# Patient Record
Sex: Female | Born: 1970
Health system: Southern US, Community
[De-identification: ages and names within clinical notes are randomized; demographics above are authoritative.]

## PROBLEM LIST (undated history)

## (undated) DIAGNOSIS — M5412 Radiculopathy, cervical region: Secondary | ICD-10-CM

## (undated) DIAGNOSIS — M7062 Trochanteric bursitis, left hip: Secondary | ICD-10-CM

## (undated) DIAGNOSIS — F32A Depression, unspecified: Secondary | ICD-10-CM

## (undated) DIAGNOSIS — F419 Anxiety disorder, unspecified: Secondary | ICD-10-CM

## (undated) DIAGNOSIS — I2699 Other pulmonary embolism without acute cor pulmonale: Secondary | ICD-10-CM

## (undated) DIAGNOSIS — G8929 Other chronic pain: Secondary | ICD-10-CM

## (undated) DIAGNOSIS — F329 Major depressive disorder, single episode, unspecified: Secondary | ICD-10-CM

## (undated) DIAGNOSIS — I951 Orthostatic hypotension: Secondary | ICD-10-CM

## (undated) DIAGNOSIS — E785 Hyperlipidemia, unspecified: Secondary | ICD-10-CM

## (undated) DIAGNOSIS — M7061 Trochanteric bursitis, right hip: Secondary | ICD-10-CM

## (undated) DIAGNOSIS — F4000 Agoraphobia, unspecified: Secondary | ICD-10-CM

## (undated) HISTORY — DX: Hyperlipidemia, unspecified: E78.5

## (undated) HISTORY — PX: BACK SURGERY: SHX140

## (undated) HISTORY — PX: OSTEOTOMY: SHX137

## (undated) HISTORY — DX: Other pulmonary embolism without acute cor pulmonale: I26.99

## (undated) HISTORY — DX: Agoraphobia, unspecified: F40.00

## (undated) HISTORY — PX: OTHER SURGICAL HISTORY: SHX169

---

## 2001-05-13 ENCOUNTER — Encounter: Payer: Self-pay | Admitting: Family Medicine

## 2001-05-13 ENCOUNTER — Ambulatory Visit (HOSPITAL_COMMUNITY): Admission: RE | Admit: 2001-05-13 | Discharge: 2001-05-13 | Payer: Self-pay | Admitting: Family Medicine

## 2002-06-02 ENCOUNTER — Encounter: Payer: Self-pay | Admitting: Family Medicine

## 2002-06-02 ENCOUNTER — Ambulatory Visit (HOSPITAL_COMMUNITY): Admission: RE | Admit: 2002-06-02 | Discharge: 2002-06-02 | Payer: Self-pay | Admitting: Family Medicine

## 2004-08-15 ENCOUNTER — Ambulatory Visit: Payer: Self-pay | Admitting: *Deleted

## 2004-08-15 ENCOUNTER — Ambulatory Visit (HOSPITAL_COMMUNITY): Admission: RE | Admit: 2004-08-15 | Discharge: 2004-08-15 | Payer: Self-pay | Admitting: Family Medicine

## 2005-07-03 DIAGNOSIS — Z8659 Personal history of other mental and behavioral disorders: Secondary | ICD-10-CM

## 2005-07-03 HISTORY — DX: Personal history of other mental and behavioral disorders: Z86.59

## 2006-11-11 ENCOUNTER — Emergency Department (HOSPITAL_COMMUNITY): Admission: EM | Admit: 2006-11-11 | Discharge: 2006-11-11 | Payer: Self-pay | Admitting: Emergency Medicine

## 2008-08-25 ENCOUNTER — Emergency Department (HOSPITAL_COMMUNITY): Admission: EM | Admit: 2008-08-25 | Discharge: 2008-08-25 | Payer: Self-pay | Admitting: Emergency Medicine

## 2010-03-19 ENCOUNTER — Emergency Department (HOSPITAL_COMMUNITY): Admission: EM | Admit: 2010-03-19 | Discharge: 2010-03-19 | Payer: Self-pay | Admitting: Emergency Medicine

## 2010-07-03 DIAGNOSIS — I2699 Other pulmonary embolism without acute cor pulmonale: Secondary | ICD-10-CM

## 2010-07-03 HISTORY — DX: Other pulmonary embolism without acute cor pulmonale: I26.99

## 2010-11-18 NOTE — Procedures (Signed)
NAMEREBECKA, OELKERS             ACCOUNT NO.:  [id number]   MEDICAL RECORD NO.:  [id number]          PATIENT TYPE:  OUT   LOCATION:  RAD                           FACILITY:  [facility]   PHYSICIAN:   Bing, M.D.  DATE OF BIRTH:  1971-06-26   DATE OF PROCEDURE:  08/15/2004  DATE OF DISCHARGE:                                  ECHOCARDIOGRAM   REFERRING PHYSICIAN:  Scott A. [name], M.D.   CLINICAL DATA:  A 40 year old woman with palpitations.   M-MODE:  Aorta 2.6, left atrium 3.3, septum 1.0, posterior wall 1.0, LV  diastole 4.4, LV systole 3.1.   RESULTS:  1.  Technically adequate echocardiographic study.  2.  Normal left atrium, right atrium and right ventricle.  3.  Normal aortic, mitral, tricuspid and pulmonic valves; normal proximal      pulmonary artery.  4.  Normal internal dimension, wall thickness, regional and global function      of the left ventricle.  5.  Normal IVC.  6.  Normal Doppler study.      RR/MEDQ  D:  08/15/2004  T:  08/15/2004  Job:  [id number]

## 2010-12-26 ENCOUNTER — Other Ambulatory Visit (HOSPITAL_COMMUNITY)
Admission: RE | Admit: 2010-12-26 | Discharge: 2010-12-26 | Disposition: A | Discharge status: Home / Self Care | Payer: BC Managed Care – PPO | Source: Ambulatory Visit | Attending: Obstetrics & Gynecology | Admitting: Obstetrics & Gynecology

## 2010-12-26 DIAGNOSIS — Z01419 Encounter for gynecological examination (general) (routine) without abnormal findings: Secondary | ICD-10-CM | POA: Insufficient documentation

## 2011-01-04 ENCOUNTER — Emergency Department (HOSPITAL_COMMUNITY): Payer: BC Managed Care – PPO

## 2011-01-04 ENCOUNTER — Inpatient Hospital Stay (HOSPITAL_COMMUNITY)
Admission: EM | Admit: 2011-01-04 | Discharge: 2011-01-06 | DRG: 078 | Disposition: A | Discharge status: Home / Self Care | Payer: BC Managed Care – PPO | Attending: Emergency Medicine | Admitting: Emergency Medicine

## 2011-01-04 ENCOUNTER — Encounter (HOSPITAL_COMMUNITY): Payer: Self-pay | Admitting: Radiology

## 2011-01-04 DIAGNOSIS — F17200 Nicotine dependence, unspecified, uncomplicated: Secondary | ICD-10-CM | POA: Diagnosis present

## 2011-01-04 DIAGNOSIS — N949 Unspecified condition associated with female genital organs and menstrual cycle: Secondary | ICD-10-CM | POA: Diagnosis present

## 2011-01-04 DIAGNOSIS — F341 Dysthymic disorder: Secondary | ICD-10-CM | POA: Diagnosis present

## 2011-01-04 DIAGNOSIS — N938 Other specified abnormal uterine and vaginal bleeding: Secondary | ICD-10-CM | POA: Diagnosis present

## 2011-01-04 DIAGNOSIS — I2699 Other pulmonary embolism without acute cor pulmonale: Principal | ICD-10-CM | POA: Diagnosis present

## 2011-01-04 DIAGNOSIS — N10 Acute tubulo-interstitial nephritis: Secondary | ICD-10-CM | POA: Diagnosis present

## 2011-01-04 LAB — CBC
HCT: 39.2 % (ref 36.0–46.0)
Hemoglobin: 13.3 g/dL (ref 12.0–15.0)
MCH: 30.4 pg (ref 26.0–34.0)
MCHC: 33.9 g/dL (ref 30.0–36.0)
MCV: 89.5 fL (ref 78.0–100.0)
Platelets: 239 10*3/uL (ref 150–400)
RBC: 4.38 MIL/uL (ref 3.87–5.11)
RDW: 12.7 % (ref 11.5–15.5)
WBC: 8.5 10*3/uL (ref 4.0–10.5)

## 2011-01-04 LAB — URINALYSIS, MICROSCOPIC ONLY
Glucose, UA: NEGATIVE mg/dL
Hgb urine dipstick: NEGATIVE
Leukocytes, UA: NEGATIVE
Specific Gravity, Urine: 1.01 (ref 1.005–1.030)
pH: 6 (ref 5.0–8.0)

## 2011-01-04 LAB — DIFFERENTIAL
Basophils Absolute: 0 10*3/uL (ref 0.0–0.1)
Basophils Relative: 0 % (ref 0–1)
Eosinophils Absolute: 0.3 10*3/uL (ref 0.0–0.7)
Eosinophils Relative: 3 % (ref 0–5)
Lymphocytes Relative: 27 % (ref 12–46)
Lymphs Abs: 2.3 10*3/uL (ref 0.7–4.0)
Monocytes Absolute: 0.8 10*3/uL (ref 0.1–1.0)
Monocytes Relative: 9 % (ref 3–12)
Neutro Abs: 5.1 10*3/uL (ref 1.7–7.7)
Neutrophils Relative %: 61 % (ref 43–77)

## 2011-01-04 LAB — BASIC METABOLIC PANEL
CO2: 24 mEq/L (ref 19–32)
Calcium: 9.2 mg/dL (ref 8.4–10.5)
Creatinine, Ser: 0.92 mg/dL (ref 0.50–1.10)
GFR calc non Af Amer: >60 mL/min (ref >60)
Glucose, Bld: 92 mg/dL (ref 70–99)
Sodium: 136 mEq/L (ref 135–145)

## 2011-01-04 MED ORDER — IOHEXOL 350 MG/ML SOLN
80.0000 mL | Freq: Once | INTRAVENOUS | Status: AC | PRN
  Administered 2011-01-04: 80 mL via INTRAVENOUS

## 2011-01-05 LAB — COMPREHENSIVE METABOLIC PANEL
ALT: 12 U/L (ref 0–35)
AST: 9 U/L (ref 0–37)
Albumin: 3.1 g/dL — ABNORMAL LOW (ref 3.5–5.2)
Calcium: 8.9 mg/dL (ref 8.4–10.5)
Chloride: 108 mEq/L (ref 96–112)
Creatinine, Ser: 0.79 mg/dL (ref 0.50–1.10)
Sodium: 139 mEq/L (ref 135–145)
Total Bilirubin: 0.2 mg/dL — ABNORMAL LOW (ref 0.3–1.2)

## 2011-01-05 LAB — PROTIME-INR
INR: 1.06 (ref 0.00–1.49)
Prothrombin Time: 14 seconds (ref 11.6–15.2)

## 2011-01-05 LAB — T4, FREE: Free T4: 1.9 ng/dL — ABNORMAL HIGH (ref 0.80–1.80)

## 2011-01-05 LAB — DIFFERENTIAL
Basophils Absolute: 0 10*3/uL (ref 0.0–0.1)
Basophils Relative: 0 % (ref 0–1)
Lymphocytes Relative: 24 % (ref 12–46)
Monocytes Absolute: 0.6 10*3/uL (ref 0.1–1.0)
Neutro Abs: 4.5 10*3/uL (ref 1.7–7.7)
Neutrophils Relative %: 63 % (ref 43–77)

## 2011-01-05 LAB — CBC
Hemoglobin: 12.8 g/dL (ref 12.0–15.0)
MCHC: 34 g/dL (ref 30.0–36.0)
WBC: 7.2 10*3/uL (ref 4.0–10.5)

## 2011-01-05 LAB — TSH: TSH: 2.302 u[IU]/mL (ref 0.350–4.500)

## 2011-01-06 LAB — CBC
HCT: 39.7 % (ref 36.0–46.0)
Hemoglobin: 13.5 g/dL (ref 12.0–15.0)
WBC: 8.2 10*3/uL (ref 4.0–10.5)

## 2011-01-06 LAB — URINE CULTURE
Culture  Setup Time: 201207052249
Special Requests: NEGATIVE

## 2011-01-06 LAB — PROTEIN S ACTIVITY: Protein S Activity: 150 % — ABNORMAL HIGH (ref 69–129)

## 2011-01-06 LAB — DIFFERENTIAL
Basophils Absolute: 0 10*3/uL (ref 0.0–0.1)
Lymphocytes Relative: 30 % (ref 12–46)
Lymphs Abs: 2.4 10*3/uL (ref 0.7–4.0)
Neutro Abs: 4.6 10*3/uL (ref 1.7–7.7)

## 2011-01-06 LAB — FACTOR 5 LEIDEN

## 2011-01-06 LAB — PROTEIN C ACTIVITY: Protein C Activity: 153 % — ABNORMAL HIGH (ref 75–133)

## 2011-01-06 LAB — PROTIME-INR: INR: 1.18 (ref 0.00–1.49)

## 2011-01-06 LAB — ANTITHROMBIN III: AntiThromb III Func: 111 % (ref 76–126)

## 2011-01-06 LAB — LUPUS ANTICOAGULANT PANEL
Lupus Anticoagulant: NOT DETECTED
dRVVT Incubated 1:1 Mix: 40.3 secs (ref 36.2–44.3)

## 2011-01-06 LAB — PROTEIN C, TOTAL: Protein C, Total: 124 % (ref 72–160)

## 2011-01-07 NOTE — H&P (Signed)
NAME:  Christina Blake, Christina Blake NO.:  [id number]  MEDICAL RECORD NO.:  [id number]  LOCATION:  IC12                          FACILITY:  [facility]  PHYSICIAN:  Elliot Cousin, M.D.    DATE OF BIRTH:  Oct 22, 1970  DATE OF ADMISSION:  01/04/2011 DATE OF DISCHARGE:  LH                             HISTORY & PHYSICAL   PRIMARY CARE PHYSICIAN:  W. Simone Curia, MD  PRIMARY GYNECOLOGIST:  Lazaro Arms, MD  CHIEF COMPLAINT:  Right mid upper back pain, shortness of breath, and pleurisy.  HISTORY OF PRESENT ILLNESS:  The patient is a 40 year old woman with a past medical history significant for anxiety, depression, and dysfunctional uterine bleeding, who presents to the hospital today with a chief complaint of right mid back pain.  Approximately 2 weeks ago, she was started on Megace by her gynecologist for treatment of dysfunctional uterine bleeding.  Last week, she traveled to [place] via motor vehicle for vacation.  She developed an acute onset of right mid back pain.  She went to a local urgent care in [place].  They obtained a urinalysis which revealed bacteria and white blood cells.  She was given the tentative diagnosis of acute pyelonephritis.  She was started on Levaquin.  Two days later, she returned to the urgent care for followup. The physician told her that she was dehydrated.  She was given 2 L of IV fluids.  The physician subsequently referred her to the local hospital for an evaluation of kidney stones.  A CT scan of her abdomen and pelvis was ordered.  She was told by one physician that she may have had a kidney stone but by another physician she was told that she did not have a kidney stone.  Of note, the day after she arrived in [place], both of her legs began to swell, but the swelling eventually subsided.  When she returned home a few days ago, she followed up with her primary care physician, Dr. [name].  He obtained the records from [place].  He was concerned  and advised the patient to have a D-dimer lab test drawn at [facility].  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  She went to the lab there.  Her D-dimer was found to be greater than 4.  She called the on- call physician for Dr. [name], Dr. Milford Cage.  Dr. Milford Cage advised the patient to come to the hospital for further evaluation.  During the initial evaluation in the emergency department, the patient was noted to be hemodynamically stable and afebrile.  She was oxygenating between 95-100%.  Her EKG revealed sinus bradycardia with a heart rate of 59 beats per minute.  A CT angiogram of her chest was ordered by the emergency department physician and it revealed extensive bilateral pulmonary embolism, small bilateral pleural effusions, and a probable developing infarct in the superior segment of the right lower lobe.  The patient was transferred to the Step-Down Unit.  She is currently comfortable.  She is in no acute distress.  She is being admitted for further evaluation and management.  PAST MEDICAL HISTORY: 1. Chronic anxiety. 2. Major  depression. 3. Dysfunctional uterine bleeding. 4. Recent diagnosis of urinary tract infection, possibly acute     pyelonephritis, treated with Levaquin.  MEDICATIONS: 1. Albuterol inhaler 2 puffs every 4 hours as needed for shortness of     breath. 2. Levaquin 750 mg daily. 3. Paroxetine CR 37.5 mg daily. 4. Alprazolam 0.1 mg 1 tablet daily and 2 tablets at night. 5. Abilify 5 mg at bedtime. 6. Megace 40 mg tablets to be tapered as directed. 7. Trazodone 100 mg at bedtime.  ALLERGIES:  No known drug allergies.  SOCIAL HISTORY:  The patient is married.  She has two children.  [place]  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  She stopped smoking 1 week ago when she became short of breath.  Up until that point, she smoked 1-pack of cigarettes per  day for 20 years.  She denies alcohol and illicit drug use.  FAMILY HISTORY:  Her mother is 48 years of age and has coronary artery disease, hypertension, and history of bladder cancer.  Her father is 58 years of age and has coronary artery disease and hypertension.  The patient has no family history of blood clots.  REVIEW OF SYSTEMS:  As above in the history present illness.  Otherwise review of systems is negative.  PHYSICAL EXAMINATION:  VITAL SIGNS:  Temperature 97.7, pulse 86, blood pressure 127/64, respiratory rate 19, and oxygen saturation 100% on 2 L of oxygen. GENERAL:  The patient is a pleasant 40 year old Caucasian woman who is currently lying in bed in no acute distress. HEENT:  Head is normocephalic and nontraumatic.  Pupils equal, round, and reactive to light.  Extraocular muscles are intact.  Conjunctivae are clear.  Sclerae are white.  Tympanic membranes not examined.  Nasal mucosa is dry.  No sinus tenderness.  Oropharynx reveals good dentition. Mucous membranes are moist.  No posterior exudates or erythema. NECK:  Supple.  No adenopathy, no thyromegaly, no bruit, and no JVD. LUNGS:  Clear to auscultation bilaterally with no crackles or wheezes. Breathing is nonlabored at rest. HEART:  S1-S2 with no murmurs, rubs, or gallops. ABDOMEN:  Positive bowel sounds, soft, nontender, and nondistended.  No hepatosplenomegaly and no masses palpated. GU/RECTAL:  Deferred. EXTREMITIES:  No calf tenderness.  Trace pedal edema bilaterally.  No particular erythema.  Range of motion of her extremities is within normal limits.  No acute hot red joints. NEUROLOGIC:  The patient is alert and oriented x3.  Cranial nerves II- XII are intact.  Strength is 5/5 throughout.  Sensation is intact. PSYCHOLOGIC:  The patient is alert.  Her speech is clear.  She is cooperative.  She does appear a little anxious.  ADMISSION LABORATORIES:  The results of the EKG and CT angiogram of the chest  were dictated above.  Chest x-ray reveals normal exam.  PT 14.0 and INR 1.06.  Sodium 136, potassium 3.9, chloride 103, CO2 24, glucose 92, BUN 19, creatinine 0.92, and calcium 9.2.  WBC 8.5, hemoglobin 13.3, and platelet count 239.  ASSESSMENT: 1. Bilateral pulmonary embolism in a patient who was recently started     on Megace.  The combination of Megace, tobacco abuse, and travel,     likely made the patient hypercoagulable.  A genetic component will     need to be assessed. 2. Small bilateral pleural effusions and a probable right lower lobe     developing infarction. 3. Chronic depression with anxiety. 4. Recent diagnosis of  acute pyelonephritis. 5. Recent treatment of dysfunctional uterine bleeding with Megace.  PLAN: 1. We will start anticoagulation with full-dose Lovenox and Coumadin. 2. We will apply oxygen titrated to keep her oxygen saturations more     than 92%.  Oxygen will also be administered for comfort. 3. Her pain will be treated with as-needed morphine or as-needed     oxycodone. 4. She will be continued on all of her chronic medications with     exception of Megace which will be discontinued indefinitely. 5. We will start as-needed bronchodilator therapy with albuterol     nebulizations although the patient has no active bronchospasms now. 6. We will gently hydrate her. 7. For further evaluation, we will order a hypercoagulable panel, TSH,     free T4, and liver transaminases. 8. The patient was congratulated on stopping smoking.     Elliot Cousin, M.D.     DF/MEDQ  D:  01/04/2011  T:  01/04/2011  Job:  [id number]  cc:   Donna Bernard, M.D. Fax: [phone]  Lazaro Arms, M.D. Fax: [phone]  Electronically Signed by Elliot Cousin M.D. on 01/07/2011 07:58:43 PM

## 2011-01-09 LAB — PROTHROMBIN GENE MUTATION

## 2011-01-13 LAB — BETA-2-GLYCOPROTEIN I ABS, IGG/M/A
Beta-2-Glycoprotein I IgA: 2 A Units (ref <20)
Beta-2-Glycoprotein I IgM: 32 M Units — ABNORMAL HIGH (ref <20)

## 2011-01-13 LAB — CARDIOLIPIN ANTIBODIES, IGG, IGM, IGA
Anticardiolipin IgA: 3 APL U/mL — ABNORMAL LOW (ref <22)
Anticardiolipin IgG: 2 GPL U/mL — ABNORMAL LOW (ref <23)

## 2011-01-20 NOTE — Discharge Summary (Signed)
NAMEOTTILIA, PIPPENGER             ACCOUNT NO.:  [id number]  MEDICAL RECORD NO.:  [id number]  LOCATION:  A304                          FACILITY:  [facility]  PHYSICIAN:  Nelline Lio L. Lendell Caprice, MDDATE OF BIRTH:  Feb 03, 1971  DATE OF ADMISSION:  01/04/2011 DATE OF DISCHARGE:  07/06/2012LH                              DISCHARGE SUMMARY   DISCHARGE DIAGNOSES: 1. Acute pulmonary embolus with infarct. 2. Anxiety. 3. Dysfunctional uterine bleeding. 4. History of depression. 5. Recently diagnosed pyelonephritis. 6. Tobacco abuse, wishes to quit.  DISCHARGE MEDICATIONS: 1. Lovenox 120 mg subcutaneously daily for 3 days, continue daily if     needed for INR less than 2.0. 2. Oxycodone 5 mg p.o. q.4 h. p.r.n. pain. 3. Warfarin 5 mg two tablets Saturday, one tablet Sunday, then as     directed by primary care physician to keep INR between 2 and 3. 4. Acetaminophen 650 mg p.o. q.4 h. p.r.n. pain. 5. Continue Abilify 5 mg nightly. 6. Alprazolam 0.5 mg one tablet daily, two tablets nightly. 7. Levofloxacin 750 mg as previous. 8. Paroxetine CR 37.5 mg a day. 9. Trazodone 100 mg nightly. 10.Albuterol inhaler 2 puffs q.4 h. p.r.n. wheezing or shortness of     breath.  CONDITION:  Stable.  ACTIVITY:  Increase as tolerated.  FOLLOWUP:  With Dr. [name] on Monday, 3 days from today for INR check and Coumadin management.  Please follow up also the rest of her hypercoagulable panel.  DIET:  Should be warfarin compatible.  CONSULTATIONS:  None.  PROCEDURES:  None.  LABS:  CBC normal.  INR on admission 1.06.  INR at discharge 1.18. Antithrombin III, protein C, protein S were all unremarkable, but drawn after anticoagulation was started.  Complete metabolic panel significant for an albumin of 3.1.  TSH normal at 2.3.  Urine pregnancy negative. Homocystine 8.5.  Urinalysis negative.  Urine cultures pending.  DIAGNOSTICS:  CT angiogram of the chest showed bilateral extensive pulmonary emboli with  small bilateral pleural effusions and probable developing infarct in the superior segment of the right lower lobe. Chest x-ray is unremarkable.  EKG showed sinus bradycardia with a rate of 59, possible left atrial enlargement.  HISTORY AND HOSPITAL COURSE:  Please see H and P for details.  Ms. Kerper is a 40 year old white female, who presented with chest pain and shortness of breath.  She had recently traveled to [place] and was ill with pyelonephritis.  She also smokes and was recently started on Megace.  She had normal vital signs and an unremarkable physical exam. However, she was found to have bilateral pulmonary emboli on CT angiogram of the chest.  She was started on Lovenox, Coumadin. Hypercoagulable workup was done.  Thus far the hypercoagulable workup has been negative..  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 [name] on day #5 of her Lovenox/Coumadin bridge therapy.  She feels better, although, she does still have some mild shortness of breath and pain.  Her vital signs have been stable and she has remained in sinus rhythm on telemetry.  Total time on the day of discharge is greater than 30 minutes.     Arturo Freundlich  Burman Freestone, MD     CLS/MEDQ  D:  01/06/2011  T:  01/07/2011  Job:  [id number]  cc:   Donna Bernard, M.D. Fax: [phone]  Lazaro Arms, M.D. Fax: [phone]  Electronically Signed by Crista Curb MD on 01/20/2011 03:31:30 PM

## 2011-02-14 ENCOUNTER — Emergency Department (HOSPITAL_COMMUNITY)
Admission: EM | Admit: 2011-02-14 | Discharge: 2011-02-15 | Disposition: A | Discharge status: Home / Self Care | Payer: BC Managed Care – PPO | Attending: Emergency Medicine | Admitting: Emergency Medicine

## 2011-02-14 ENCOUNTER — Other Ambulatory Visit: Payer: Self-pay

## 2011-02-14 ENCOUNTER — Encounter (HOSPITAL_COMMUNITY): Payer: Self-pay | Admitting: *Deleted

## 2011-02-14 ENCOUNTER — Emergency Department (HOSPITAL_COMMUNITY): Payer: BC Managed Care – PPO

## 2011-02-14 DIAGNOSIS — R791 Abnormal coagulation profile: Secondary | ICD-10-CM | POA: Insufficient documentation

## 2011-02-14 DIAGNOSIS — M549 Dorsalgia, unspecified: Secondary | ICD-10-CM | POA: Insufficient documentation

## 2011-02-14 DIAGNOSIS — R079 Chest pain, unspecified: Secondary | ICD-10-CM | POA: Insufficient documentation

## 2011-02-14 DIAGNOSIS — R0789 Other chest pain: Secondary | ICD-10-CM | POA: Insufficient documentation

## 2011-02-14 LAB — CBC
HCT: 38.2 % (ref 36.0–46.0)
MCHC: 33.5 g/dL (ref 30.0–36.0)
MCV: 90.7 fL (ref 78.0–100.0)
Platelets: 268 10*3/uL (ref 150–400)
RDW: 13.4 % (ref 11.5–15.5)

## 2011-02-14 LAB — PROTIME-INR: Prothrombin Time: 15.9 seconds — ABNORMAL HIGH (ref 11.6–15.2)

## 2011-02-14 LAB — BASIC METABOLIC PANEL
BUN: 17 mg/dL (ref 6–23)
Creatinine, Ser: 0.8 mg/dL (ref 0.50–1.10)
GFR calc Af Amer: >60 mL/min (ref >60)
GFR calc non Af Amer: >60 mL/min (ref >60)

## 2011-02-14 LAB — D-DIMER, QUANTITATIVE: D-Dimer, Quant: 0.61 ug/mL-FEU — ABNORMAL HIGH (ref 0.00–0.48)

## 2011-02-14 NOTE — ED Notes (Signed)
Pt reports July 4th she was admitted with bilateral PE, tonight reports similar pain in left side of back with asso sob/chest pain

## 2011-02-14 NOTE — ED Notes (Signed)
Pt reports she had bilat PEs about one month ago.  Reports that tonight she began having some of the same symptoms.  Reports mild SOB, and occasional sharp pain on middle back on left side.  Pt reports midsternal chest pain as well.  Appears to be breathing normally with no increased effort.

## 2011-02-14 NOTE — ED Provider Notes (Signed)
History     CSN: [id number] Arrival date & time: 02/14/2011  9:53 PM  Chief Complaint  Patient presents with  . Shortness of Breath  . Back Pain   HPI Comments: Pt localizes her "sharp" pain to the L lateral chest and L  Subscapular areas.  'feels just like when i had my last PE a few months ago".  No recent leg trauma or convalescent period.  No OCP use.  No known cancer.  No hemoptysis.  PERC negative.  Her mother recently had PE also,  Patient is a 39 y.o. female presenting with shortness of breath and back pain. The history is provided by the patient. No language interpreter was used.  Shortness of Breath  The current episode started today. The problem has been gradually worsening. The problem is moderate. The symptoms are relieved by nothing. Exacerbated by: deep inspirationconnor. Associated symptoms include cough and shortness of breath. Pertinent negatives include no stridor and no wheezing.  Back Pain     Past Medical History  Diagnosis Date  . Pulmonary embolism     History reviewed. No pertinent past surgical history.  No family history on file.  History  Substance Use Topics  . Smoking status: Never Smoker   . Smokeless tobacco: Not on file  . Alcohol Use: No    OB History    Grav Para Term Preterm Abortions TAB SAB Ect Mult Living                  Review of Systems  Respiratory: Positive for cough and shortness of breath. Negative for wheezing and stridor.        Cough is NP.  Denies truly being SOB but states she is breathing shallowly to avoid painful inspiration.  Musculoskeletal: Positive for back pain.  All other systems reviewed and are negative.    Physical Exam  BP 131/78  Pulse 69  Temp(Src) 98.1 F (36.7 C) (Oral)  Resp 18  Ht 5\' 7"  (1.702 m)  Wt 192 lb (87.091 kg)  BMI 30.07 kg/m2  SpO2 100%  LMP 02/14/2011  Physical Exam  Nursing note and vitals reviewed. Constitutional: She is oriented to person, place, and time. Vital signs are  normal. She appears well-developed and well-nourished. No distress.  HENT:  Head: Normocephalic and atraumatic.  Right Ear: External ear normal.  Left Ear: External ear normal.  Nose: Nose normal.  Mouth/Throat: No oropharyngeal exudate.  Eyes: Conjunctivae and EOM are normal. Pupils are equal, round, and reactive to light. Right eye exhibits no discharge. Left eye exhibits no discharge. No scleral icterus.  Neck: Normal range of motion. Neck supple. No JVD present. No tracheal deviation present. No thyromegaly present.  Cardiovascular: Normal rate, regular rhythm, normal heart sounds, intact distal pulses and normal pulses.  Exam reveals no gallop and no friction rub.   No murmur heard. Pulmonary/Chest: Effort normal and breath sounds normal. No stridor. No respiratory distress. She has no wheezes. She has no rales.   She exhibits no tenderness.  Abdominal: Soft. Normal appearance and bowel sounds are normal. She exhibits no distension and no mass. There is no tenderness. There is no rebound and no guarding.  Musculoskeletal: Normal range of motion. She exhibits no edema and no tenderness.       Arms: Lymphadenopathy:    She has no cervical adenopathy.  Neurological: She is alert and oriented to person, place, and time. She has normal reflexes. No cranial nerve deficit. Coordination normal. GCS eye subscore  is 4. GCS verbal subscore is 5. GCS motor subscore is 6.  Reflex Scores:      Tricep reflexes are 2+ on the right side and 2+ on the left side.      Bicep reflexes are 2+ on the right side and 2+ on the left side.      Brachioradialis reflexes are 2+ on the right side and 2+ on the left side.      Patellar reflexes are 2+ on the right side and 2+ on the left side.      Achilles reflexes are 2+ on the right side and 2+ on the left side. Skin: Skin is warm and dry. No rash noted. She is not diaphoretic.  Psychiatric: She has a normal mood and affect. Her speech is normal and behavior is  normal. Judgment and thought content normal. Cognition and memory are normal.    ED Course  Procedures  MDM       Worthy Rancher, PA 02/14/11 2314

## 2011-02-15 ENCOUNTER — Emergency Department (HOSPITAL_COMMUNITY): Payer: BC Managed Care – PPO

## 2011-02-15 MED ORDER — HYDROMORPHONE HCL 2 MG/ML IJ SOLN
2.0000 mg | Freq: Once | INTRAMUSCULAR | Status: DC
  Filled 2011-02-15: qty 1

## 2011-02-15 MED ORDER — ONDANSETRON HCL 4 MG/2ML IJ SOLN
4.0000 mg | Freq: Once | INTRAMUSCULAR | Status: DC
  Filled 2011-02-15: qty 2

## 2011-02-15 MED ORDER — IOHEXOL 300 MG/ML  SOLN
100.0000 mL | Freq: Once | INTRAMUSCULAR | Status: AC | PRN
  Administered 2011-02-15: 100 mL via INTRAVENOUS

## 2011-02-15 NOTE — ED Notes (Addendum)
See downtime flow sheet.  Evaluation and management procedures were performed by the PA/NP under my supervision/collaboration.  Chest Portable 1 View  02/14/2011  *RADIOLOGY REPORT*  Clinical Data: Chest pain. Pulmonary embolus diagnosed last month.  PORTABLE CHEST - 1 VIEW  Comparison: 01/04/2011  Findings: Cardiac and mediastinal contours appear normal.  The lungs appear clear.  No pleural effusion is identified.  IMPRESSION:  No significant abnormality identified.  Original Report Authenticated By: Dellia Cloud, M.D.   CT scan reviewed by me and shows no acute pulmonary emboli. The patient's INR is subtherapeutic which was discussed with the patient. The patient will be discharged home to followup with her primary care physician Dr. [name]. The patient states her understanding of and agreement with this plan of care.  Felisa Bonier, MD 02/15/11 [phone]

## 2011-02-16 NOTE — ED Provider Notes (Addendum)
Evaluation and management procedures were performed by the PA/NP under my supervision/collaboration.    Felisa Bonier, MD 02/16/11 0238   Date: 05/12/2011  Rate: 61  Rhythm: normal sinus rhythm  QRS Axis: normal  Intervals: normal  ST/T Wave abnormalities: normal  Conduction Disutrbances:none  Narrative Interpretation:   Old EKG Reviewed: none available    Felisa Bonier, MD 05/12/11 1505

## 2011-08-28 ENCOUNTER — Emergency Department (HOSPITAL_COMMUNITY): Payer: BC Managed Care – PPO

## 2011-08-28 ENCOUNTER — Emergency Department (HOSPITAL_COMMUNITY)
Admission: EM | Admit: 2011-08-28 | Discharge: 2011-08-28 | Disposition: A | Discharge status: Home Health | Payer: BC Managed Care – PPO | Attending: Emergency Medicine | Admitting: Emergency Medicine

## 2011-08-28 ENCOUNTER — Other Ambulatory Visit: Payer: Self-pay

## 2011-08-28 ENCOUNTER — Encounter (HOSPITAL_COMMUNITY): Payer: Self-pay | Admitting: *Deleted

## 2011-08-28 DIAGNOSIS — R059 Cough, unspecified: Secondary | ICD-10-CM | POA: Insufficient documentation

## 2011-08-28 DIAGNOSIS — E7800 Pure hypercholesterolemia, unspecified: Secondary | ICD-10-CM | POA: Insufficient documentation

## 2011-08-28 DIAGNOSIS — R0602 Shortness of breath: Secondary | ICD-10-CM | POA: Insufficient documentation

## 2011-08-28 DIAGNOSIS — R079 Chest pain, unspecified: Secondary | ICD-10-CM | POA: Insufficient documentation

## 2011-08-28 DIAGNOSIS — Z86718 Personal history of other venous thrombosis and embolism: Secondary | ICD-10-CM | POA: Insufficient documentation

## 2011-08-28 DIAGNOSIS — M940 Chondrocostal junction syndrome [Tietze]: Secondary | ICD-10-CM

## 2011-08-28 DIAGNOSIS — Z79899 Other long term (current) drug therapy: Secondary | ICD-10-CM | POA: Insufficient documentation

## 2011-08-28 DIAGNOSIS — R05 Cough: Secondary | ICD-10-CM | POA: Insufficient documentation

## 2011-08-28 DIAGNOSIS — Z7901 Long term (current) use of anticoagulants: Secondary | ICD-10-CM | POA: Insufficient documentation

## 2011-08-28 LAB — CBC
HCT: 42.9 % (ref 36.0–46.0)
Hemoglobin: 14.5 g/dL (ref 12.0–15.0)
MCH: 30.4 pg (ref 26.0–34.0)
MCHC: 33.8 g/dL (ref 30.0–36.0)
MCV: 89.9 fL (ref 78.0–100.0)

## 2011-08-28 LAB — BASIC METABOLIC PANEL
BUN: 11 mg/dL (ref 6–23)
Calcium: 9.7 mg/dL (ref 8.4–10.5)
Chloride: 102 mEq/L (ref 96–112)
Creatinine, Ser: 0.78 mg/dL (ref 0.50–1.10)
GFR calc Af Amer: >90 mL/min (ref >90)

## 2011-08-28 LAB — DIFFERENTIAL
Basophils Relative: 1 % (ref 0–1)
Eosinophils Absolute: 0.1 10*3/uL (ref 0.0–0.7)
Eosinophils Relative: 1 % (ref 0–5)
Monocytes Absolute: 0.7 10*3/uL (ref 0.1–1.0)
Monocytes Relative: 8 % (ref 3–12)
Neutro Abs: 6 10*3/uL (ref 1.7–7.7)

## 2011-08-28 LAB — POCT I-STAT TROPONIN I: Troponin i, poc: 0 ng/mL (ref 0.00–0.08)

## 2011-08-28 MED ORDER — HYDROCODONE-ACETAMINOPHEN 5-325 MG PO TABS: 2.0000 | ORAL_TABLET | ORAL | Status: AC | PRN

## 2011-08-28 NOTE — ED Provider Notes (Signed)
History  This chart was scribed for Christina Bonier, MD by Christina Blake. This patient was seen in room APA17/APA17 and the patient's care was started at 4:25PM.  CSN: [id number]  Arrival date & time 08/28/11  1400   First MD Initiated Contact with Patient 08/28/11 1513      Chief Complaint  Patient presents with  . Chest Pain     The history is provided by the patient. No language interpreter was used.    [name] is a 41 y.o. female who presents to the Emergency Department complaining of 2 days of gradual onset, gradually worsening, constant, non-radiating chest pain. The pt describes the pain as being a sub-sternally located tight and pressure like feeling. She rates the pain a 5 out of 10 at its best and 7 out of 10 at its worst. She also c/o occasional SOB and dry cough. She denies any modifying factors and has not taken anything PTA to improve her symptoms.  She reports that she has a h/o PE but states that the pain she is experiencing now is different from the pain experienced with the PE. She denies palpitations, diaphoresis, nausea, emesis and diarrhea as associated symptoms. She also has a h/o hypercholesteremia. She is a current everyday smoker but denies alcohol use.  Pt's PCP is Dr. [name].  Past Medical History  Diagnosis Date  . Pulmonary embolism July 4th, 2012  . Hypercholesteremia     History reviewed. No pertinent past surgical history.  History reviewed. No pertinent family history.  History  Substance Use Topics  . Smoking status: Current Everyday Smoker -- 1.0 packs/day    Types: Cigarettes  . Smokeless tobacco: Not on file  . Alcohol Use: No     Review of Systems  Constitutional: Negative for fever, chills and diaphoresis.  HENT: Negative for congestion, sore throat and neck pain.   Eyes: Negative for pain.  Respiratory: Positive for cough and shortness of breath.   Cardiovascular: Positive for chest pain. Negative for palpitations.    Gastrointestinal: Negative for nausea, vomiting, abdominal pain and diarrhea.  Genitourinary: Negative for dysuria, urgency and hematuria.  Musculoskeletal: Negative for back pain.  Skin: Negative for rash.  Neurological: Negative for numbness and headaches.    Allergies  Review of patient's allergies indicates no known allergies.  Home Medications   Current Outpatient Rx  Name Route Sig Dispense Refill  . ALPRAZOLAM 0.5 MG PO TABS Oral Take 0.5-1 mg by mouth 2 (two) times daily. * Take one tablet po daily, then take two tablets at bedtime*    . LAMOTRIGINE 200 MG PO TABS Oral Take 200 mg by mouth at bedtime.      . CENTRUM PO Oral Take 1 tablet by mouth daily.      Marland Kitchen PAROXETINE HCL ER 37.5 MG PO TB24 Oral Take 37.5 mg by mouth every morning.      . TRAZODONE HCL 100 MG PO TABS Oral Take 100-200 mg by mouth at bedtime. Take one tablet po 1 hour before bedtime. *may repeat once if needed*    . WARFARIN SODIUM 5 MG PO TABS Oral Take 10-12.5 mg by mouth as directed. 10mg  on Sat and Sun and 12.5mg  on every other day of the week.      Triage Vitals: BP 123/80  Pulse 95  Temp(Src) 98 F (36.7 C) (Oral)  Resp 20  Ht 5\' 7"  (1.702 m)  Wt 194 lb (87.998 kg)  BMI 30.38 kg/m2  SpO2 98%  LMP 08/14/2011  Physical Exam  Nursing note and vitals reviewed. Constitutional: She is oriented to person, place, and time. She appears well-developed and well-nourished.  HENT:  Head: Normocephalic and atraumatic.  Mouth/Throat: Oropharynx is clear and moist.  Eyes: EOM are normal. Pupils are equal, round, and reactive to light.  Neck: Normal range of motion. Neck supple. No JVD present. No tracheal deviation present.       Trachea is midline  Cardiovascular: Normal rate, regular rhythm and normal heart sounds.  Exam reveals no gallop and no friction rub.   No murmur heard.      Normal S1 and S2  Pulmonary/Chest: Effort normal and breath sounds normal. No respiratory distress. She has no wheezes.  She has no rales. She exhibits tenderness (Mild sub-sternal tenderness).       Lungs are clear bilaterally  Abdominal: Soft. Bowel sounds are normal. There is no tenderness (No tenderness to palpation ). There is no rebound and no guarding.  Musculoskeletal: She exhibits no edema (No lower extremity edema) and no tenderness (No calf tenderness).  Neurological: She is alert and oriented to person, place, and time. No cranial nerve deficit.  Skin: Skin is warm and dry.  Psychiatric: She has a normal mood and affect. Her behavior is normal.    ED Course  Procedures (including critical care time)  Date: 08/28/2011  Rate: 81  Rhythm: normal sinus rhythm  QRS Axis: normal  Intervals: normal  ST/T Wave abnormalities: normal  Conduction Disutrbances:none  Narrative Interpretation: Normal EKG  Old EKG Reviewed: none available   DIAGNOSTIC STUDIES: Oxygen Saturation is 98% on room air, normal by my interpretation.    COORDINATION OF CARE: 4:26PM-Discussed chest x-ray, D-dimer and blood panel results. 4:30PM-Discussed diagnosis with pt. Will give pt pain medications. Advised pt to follow up with PCP in 4 days if symptoms persist.   Labs Reviewed  PROTIME-INR - Abnormal; Notable for the following:    Prothrombin Time 27.0 (*)    INR 2.45 (*)    All other components within normal limits  CBC  DIFFERENTIAL  BASIC METABOLIC PANEL  D-DIMER, QUANTITATIVE  POCT I-STAT TROPONIN I   Dg Chest 2 View  08/28/2011  *RADIOLOGY REPORT*  Clinical Data: Chest pain.  CHEST - 2 VIEW  Comparison: 02/14/2011.  Findings: The cardiac silhouette, mediastinal and hilar contours are within normal limits and stable. The lungs are clear.  No pleural effusions.  The bony thorax is intact.  IMPRESSION: Normal chest x-ray.  No change since prior study.  Original Report Authenticated By: P. Loralie Champagne, M.D.     No diagnosis found.    MDM  Musculoskeletal chest pain, costochondritis, GERD,  Gastrointestinal Chest Pain, Pleuritic Chest Pain, Pneumonia, Pneumothorax, Pulmonary Embolism, Esophageal Spasm, Arrhythmia considered among other potential etiologies in the patient's differential diagnosis. ACS, MI, UA not suspected based on history, physical exam, diagnostic studies, and due to lack of risk factors for CAD.      I personally performed the services described in this documentation, which was scribed in my presence. The recorded information has been reviewed and considered.    Christina Bonier, MD 08/28/11 [phone]

## 2011-08-28 NOTE — ED Notes (Signed)
Hx PE, c/o CP to mid chest since Sat. With SOB, dizziness and weakness and back pain, denies cough

## 2011-09-13 ENCOUNTER — Other Ambulatory Visit: Payer: Self-pay | Admitting: Obstetrics & Gynecology

## 2011-09-15 NOTE — Patient Instructions (Addendum)
20 Christina Blake  09/15/2011   Your procedure is scheduled on:   09/22/2011  Report to [facility] at  745  AM.  Call this number if you have problems the morning of surgery: [phone]   Remember:   Do not eat food:After Midnight.  May have clear liquids:until Midnight .  Clear liquids include soda, tea, black coffee, apple or grape juice, broth.  Take these medicines the morning of surgery with A SIP OF WATER:  none   Do not wear jewelry, make-up or nail polish.  Do not wear lotions, powders, or perfumes. You may wear deodorant.  Do not shave 48 hours prior to surgery.  Do not bring valuables to the hospital.  Contacts, dentures or bridgework may not be worn into surgery.  Leave suitcase in the car. After surgery it may be brought to your room.  For patients admitted to the hospital, checkout time is 11:00 AM the day of discharge.   Patients discharged the day of surgery will not be allowed to drive home.  Name and phone number of your driver: family  Special Instructions: CHG Shower Use Special Wash: 1/2 bottle night before surgery and 1/2 bottle morning of surgery.   Please read over the following fact sheets that you were given: Pain Booklet, MRSA Information, Surgical Site Infection Prevention, Anesthesia Post-op Instructions and Care and Recovery After Surgery Endometrial Ablation Endometrial ablation removes the lining of the uterus (endometrium). It is usually a same day, outpatient treatment. Ablation helps avoid major surgery (such as a hysterectomy). A hysterectomy is removal of the cervix and uterus. Endometrial ablation has less risk and complications, has a shorter recovery period and is less expensive. After endometrial ablation, most women will have little or no menstrual bleeding. You may not keep your fertility. Pregnancy is no longer likely after this procedure but if you are pre-menopausal, you still need to use a reliable method of birth control following the procedure  because pregnancy can occur. REASONS TO HAVE THE PROCEDURE MAY INCLUDE:  Heavy periods.   Bleeding that is causing anemia.   Anovulatory bleeding, very irregular, bleeding.   Bleeding submucous fibroids (on the lining inside the uterus) if they are smaller than 3 centimeters.  REASONS NOT TO HAVE THE PROCEDURE MAY INCLUDE:  You wish to have more children.   You have a pre-cancerous or cancerous problem. The cause of any abnormal bleeding must be diagnosed before having the procedure.   You have pain coming from the uterus.   You have a submucus fibroid larger than 3 centimeters.   You recently had a baby.   You recently had an infection in the uterus.   You have a severe retro-flexed, tipped uterus and cannot insert the instrument to do the ablation.   You had a Cesarean section or deep major surgery on the uterus.   The inner cavity of the uterus is too large for the endometrial ablation instrument.  RISKS AND COMPLICATIONS   Perforation of the uterus.   Bleeding.   Infection of the uterus, bladder or vagina.   Injury to surrounding organs.   Cutting the cervix.   An air bubble to the lung (air embolus).   Pregnancy following the procedure.   Failure of the procedure to help the problem requiring hysterectomy.   Decreased ability to diagnose cancer in the lining of the uterus.  BEFORE THE PROCEDURE  The lining of the uterus must be tested to make sure there is no pre-cancerous  or cancer cells present.   Medications may be given to make the lining of the uterus thinner.   Ultrasound may be used to evaluate the size and look for abnormalities of the uterus.   Future pregnancy is not desired.  PROCEDURE  There are different ways to destroy the lining of the uterus.   Resectoscope - radio frequency-alternating electric current is the most common one used.   Cryotherapy - freezing the lining of the uterus.   Heated Free Liquid - heated salt (saline)  solution inserted into the uterus.   Microwave - uses high energy microwaves in the uterus.   Thermal Balloon - a catheter with a balloon tip is inserted into the uterus and filled with heated fluid.  Your caregiver will talk with you about the method used in this clinic. They will also instruct you on the pros and cons of the procedure. Endometrial ablation is performed along with a procedure called operative hysteroscopy. A narrow viewing tube is inserted through the birth canal (vagina) and through the cervix into the uterus. A tiny camera attached to the viewing tube (hysteroscope) allows the uterine cavity to be shown on a TV monitor during surgery. Your uterus is filled with a harmless liquid to make the procedure easier. The lining of the uterus is then removed. The lining can also be removed with a resectoscope which allows your surgeon to cut away the lining of the uterus under direct vision. Usually, you will be able to go home within an hour after the procedure. HOME CARE INSTRUCTIONS   Do not drive for 24 hours.   No tampons, douching or intercourse for 2 weeks or until your caregiver approves.   Rest at home for 24 to 48 hours. You may then resume normal activities unless told differently by your caregiver.   Take your temperature two times a day for 4 days, and record it.   Take any medications your caregiver has ordered, as directed.   Use some form of contraception if you are pre-menopausal and do not want to get pregnant.  Bleeding after the procedure is normal. It varies from light spotting and mildly watery to bloody discharge for 4 to 6 weeks. You may also have mild cramping. Only take over-the-counter or prescription medicines for pain, discomfort, or fever as directed by your caregiver. Do not use aspirin, as this may aggravate bleeding. Frequent urination during the first 24 hours is normal. You will not know how effective your surgery is until at least 3 months after the  surgery. SEEK IMMEDIATE MEDICAL CARE IF:   Bleeding is heavier than a normal menstrual cycle.   An oral temperature above 102 F (38.9 C) develops.   You have increasing cramps or pains not relieved with medication or develop belly (abdominal) pain which does not seem to be related to the same area of earlier cramping and pain.   You are light headed, weak or have fainting episodes.   You develop pain in the shoulder strap areas.   You have chest or leg pain.   You have abnormal vaginal discharge.   You have painful urination.  Document Released: 04/28/2004 Document Revised: 06/08/2011 Document Reviewed: 07/27/2007 [facility] Patient Information 2012 [place], [place].Dilation and Curettage or Vacuum Curettage Dilation and curettage (D&C) and vacuum curettage are minor procedures. A D&C involves stretching (dilation) the cervix and scraping (curettage) the inside lining of the womb (uterus). During a D&C, tissue is gently scraped from the inside lining of the uterus.  During a vacuum curettage, the lining and tissue in the uterus are removed with the use of gentle suction. Curettage may be performed for diagnostic or therapeutic purposes. As a diagnostic procedure, curettage is performed for the purpose of examining tissues from the uterus. Tissue examination may help determine causes or treatment options for symptoms. A diagnostic curettage may be performed for the following symptoms:  Irregular bleeding in the uterus.   Bleeding with the development of clots.   Spotting between menstrual periods.   Prolonged menstrual periods.   Bleeding after menopause.   No menstrual period (amenorrhea).   A change in size and shape of the uterus.  A therapeutic curettage is performed to remove tissue, blood, or a contraceptive device. Therapeutic curettage may be performed for the following conditions:   Removal of an IUD (intrauterine device).   Removal of retained placenta after giving  birth. Retained placenta can cause bleeding severe enough to require transfusions or an infection.   Abortion.   Miscarriage.   Removal of polyps inside the uterus.   Removal of uncommon types of fibroids (noncancerous lumps).  LET YOUR CAREGIVER KNOW ABOUT:   Allergies to food or medicine.   Medicines taken, including vitamins, herbs, eyedrops, over-the-counter medicines, and creams.   Use of steroids (by mouth or creams).   Previous problems with anesthetics or numbing medicines.   History of bleeding problems or blood clots.   Previous surgery.   Other health problems, including diabetes and kidney problems.   Possibility of pregnancy, if this applies.  RISKS AND COMPLICATIONS   Excessive bleeding.   Infection of the uterus.   Damage to the cervix.   Development of scar tissue (adhesions) inside the uterus, later causing abnormal amounts of menstrual bleeding.   Complications from the general anesthetic, if a general anesthetic is used.   Putting a hole (perforation) in the uterus. This is rare.  BEFORE THE PROCEDURE   Eat and drink before the procedure only as directed by your caregiver.   Arrange for someone to take you home.  PROCEDURE   This procedure may be done in a hospital, outpatient clinic, or caregiver's office.   You may be given a general anesthetic or a local anesthetic in and around the cervix.   You will lie on your back with your legs in stirrups.   There are two ways in which your cervix can be softened and dilated. These include:   Taking a medicine.   Having thin rods (laminaria) inserted into your cervix.   A curved tool (curette) will scrape cells from the inside lining of the uterus and will then be removed.  This procedure usually takes about 15 to 30 minutes. AFTER THE PROCEDURE   You will rest in the recovery area until you are stable and are ready to go home.   You will need to have someone take you home.   You may feel  sick to your stomach (nauseous) or throw up (vomit) if you had general anesthesia.   You may have a sore throat if a tube was placed in your throat during general anesthesia.   You may have light cramping and bleeding for 2 days to 2 weeks after the procedure.   Your uterus needs to make a new lining after the procedure. This may make your next period late.  Document Released: 06/19/2005 Document Revised: 06/08/2011 Document Reviewed: 01/15/2009 [facility] Patient Information 2012 [place], [place].PATIENT INSTRUCTIONS POST-ANESTHESIA  IMMEDIATELY FOLLOWING SURGERY:  Do not drive  or operate machinery for the first twenty four hours after surgery.  Do not make any important decisions for twenty four hours after surgery or while taking narcotic pain medications or sedatives.  If you develop intractable nausea and vomiting or a severe headache please notify your doctor immediately.  FOLLOW-UP:  Please make an appointment with your surgeon as instructed. You do not need to follow up with anesthesia unless specifically instructed to do so.  WOUND CARE INSTRUCTIONS (if applicable):  Keep a dry clean dressing on the anesthesia/puncture wound site if there is drainage.  Once the wound has quit draining you may leave it open to air.  Generally you should leave the bandage intact for twenty four hours unless there is drainage.  If the epidural site drains for more than 36-48 hours please call the anesthesia department.  QUESTIONS?:  Please feel free to call your physician or the hospital operator if you have any questions, and they will be happy to assist you.     [facility] Anesthesia Department [address] [place] [phone]

## 2011-09-18 ENCOUNTER — Encounter (HOSPITAL_COMMUNITY)
Admission: RE | Admit: 2011-09-18 | Discharge: 2011-09-18 | Disposition: A | Discharge status: Home / Self Care | Payer: BC Managed Care – PPO | Source: Ambulatory Visit | Attending: Obstetrics & Gynecology | Admitting: Obstetrics & Gynecology

## 2011-09-18 ENCOUNTER — Encounter (HOSPITAL_COMMUNITY): Payer: Self-pay | Admitting: Pharmacy Technician

## 2011-09-18 ENCOUNTER — Encounter (HOSPITAL_COMMUNITY): Payer: Self-pay

## 2011-09-18 HISTORY — DX: Major depressive disorder, single episode, unspecified: F32.9

## 2011-09-18 HISTORY — DX: Anxiety disorder, unspecified: F41.9

## 2011-09-18 HISTORY — DX: Depression, unspecified: F32.A

## 2011-09-18 LAB — CBC
HCT: 40.2 % (ref 36.0–46.0)
Platelets: 232 10*3/uL (ref 150–400)
RDW: 13.6 % (ref 11.5–15.5)
WBC: 7.3 10*3/uL (ref 4.0–10.5)

## 2011-09-18 LAB — SURGICAL PCR SCREEN: Staphylococcus aureus: NEGATIVE

## 2011-09-18 LAB — COMPREHENSIVE METABOLIC PANEL
AST: 14 U/L (ref 0–37)
Albumin: 4.1 g/dL (ref 3.5–5.2)
Alkaline Phosphatase: 92 U/L (ref 39–117)
BUN: 8 mg/dL (ref 6–23)
Chloride: 102 mEq/L (ref 96–112)
Potassium: 4.1 mEq/L (ref 3.5–5.1)
Total Bilirubin: 0.3 mg/dL (ref 0.3–1.2)

## 2011-09-22 ENCOUNTER — Encounter (HOSPITAL_COMMUNITY): Payer: Self-pay | Admitting: *Deleted

## 2011-09-22 ENCOUNTER — Ambulatory Visit (HOSPITAL_COMMUNITY): Payer: BC Managed Care – PPO | Admitting: Anesthesiology

## 2011-09-22 ENCOUNTER — Encounter (HOSPITAL_COMMUNITY)
Admission: RE | Disposition: A | Discharge status: Home / Self Care | Payer: Self-pay | Source: Ambulatory Visit | Attending: Obstetrics & Gynecology

## 2011-09-22 ENCOUNTER — Ambulatory Visit (HOSPITAL_COMMUNITY)
Admission: RE | Admit: 2011-09-22 | Discharge: 2011-09-22 | Disposition: A | Discharge status: Home / Self Care | Payer: BC Managed Care – PPO | Source: Ambulatory Visit | Attending: Obstetrics & Gynecology | Admitting: Obstetrics & Gynecology

## 2011-09-22 ENCOUNTER — Encounter (HOSPITAL_COMMUNITY): Payer: Self-pay | Admitting: Anesthesiology

## 2011-09-22 DIAGNOSIS — N920 Excessive and frequent menstruation with regular cycle: Secondary | ICD-10-CM | POA: Insufficient documentation

## 2011-09-22 DIAGNOSIS — Z01812 Encounter for preprocedural laboratory examination: Secondary | ICD-10-CM | POA: Insufficient documentation

## 2011-09-22 DIAGNOSIS — Z9889 Other specified postprocedural states: Secondary | ICD-10-CM

## 2011-09-22 DIAGNOSIS — E7800 Pure hypercholesterolemia, unspecified: Secondary | ICD-10-CM | POA: Insufficient documentation

## 2011-09-22 DIAGNOSIS — N946 Dysmenorrhea, unspecified: Secondary | ICD-10-CM | POA: Insufficient documentation

## 2011-09-22 SURGERY — DILATATION & CURETTAGE/HYSTEROSCOPY WITH THERMACHOICE ABLATION
Anesthesia: General | Site: Vagina | Wound class: Clean Contaminated

## 2011-09-22 MED ORDER — ONDANSETRON HCL 4 MG/2ML IJ SOLN
INTRAMUSCULAR | Status: AC
  Filled 2011-09-22: qty 2

## 2011-09-22 MED ORDER — KETOROLAC TROMETHAMINE 30 MG/ML IJ SOLN
30.0000 mg | Freq: Once | INTRAMUSCULAR | Status: AC
  Administered 2011-09-22: 30 mg via INTRAVENOUS

## 2011-09-22 MED ORDER — GLYCOPYRROLATE 0.2 MG/ML IJ SOLN
INTRAMUSCULAR | Status: AC
  Administered 2011-09-22: 0.2 mg via INTRAVENOUS
  Filled 2011-09-22: qty 1

## 2011-09-22 MED ORDER — LIDOCAINE HCL (PF) 1 % IJ SOLN
INTRAMUSCULAR | Status: AC
  Filled 2011-09-22: qty 5

## 2011-09-22 MED ORDER — HYDROCODONE-ACETAMINOPHEN 5-500 MG PO TABS: 1.0000 | ORAL_TABLET | Freq: Four times a day (QID) | ORAL | Status: AC | PRN

## 2011-09-22 MED ORDER — GLYCOPYRROLATE 0.2 MG/ML IJ SOLN: 0.2000 mg | Freq: Once | INTRAMUSCULAR | Status: DC

## 2011-09-22 MED ORDER — ONDANSETRON HCL 4 MG/2ML IJ SOLN
INTRAMUSCULAR | Status: AC
  Administered 2011-09-22: 4 mg via INTRAVENOUS
  Filled 2011-09-22: qty 2

## 2011-09-22 MED ORDER — FENTANYL CITRATE 0.05 MG/ML IJ SOLN
25.0000 ug | INTRAMUSCULAR | Status: DC | PRN
  Administered 2011-09-22 (×4): 50 ug via INTRAVENOUS

## 2011-09-22 MED ORDER — PROPOFOL 10 MG/ML IV EMUL
INTRAVENOUS | Status: DC | PRN
  Administered 2011-09-22: 150 mg via INTRAVENOUS
  Administered 2011-09-22: 50 mg via INTRAVENOUS

## 2011-09-22 MED ORDER — KETOROLAC TROMETHAMINE 10 MG PO TABS
10.0000 mg | ORAL_TABLET | Freq: Three times a day (TID) | ORAL | Status: AC | PRN
Start: 2011-09-22 — End: 2011-09-27

## 2011-09-22 MED ORDER — KETOROLAC TROMETHAMINE 30 MG/ML IJ SOLN
INTRAMUSCULAR | Status: AC
  Filled 2011-09-22: qty 1

## 2011-09-22 MED ORDER — MIDAZOLAM HCL 2 MG/2ML IJ SOLN
INTRAMUSCULAR | Status: AC
  Filled 2011-09-22: qty 2

## 2011-09-22 MED ORDER — FENTANYL CITRATE 0.05 MG/ML IJ SOLN
INTRAMUSCULAR | Status: AC
  Administered 2011-09-22: 50 ug via INTRAVENOUS
  Filled 2011-09-22: qty 2

## 2011-09-22 MED ORDER — FENTANYL CITRATE 0.05 MG/ML IJ SOLN
INTRAMUSCULAR | Status: AC
  Filled 2011-09-22: qty 2

## 2011-09-22 MED ORDER — ACETAMINOPHEN 325 MG PO TABS: 325.0000 mg | ORAL_TABLET | ORAL | Status: DC | PRN

## 2011-09-22 MED ORDER — FENTANYL CITRATE 0.05 MG/ML IJ SOLN
INTRAMUSCULAR | Status: DC | PRN
  Administered 2011-09-22 (×2): 50 ug via INTRAVENOUS

## 2011-09-22 MED ORDER — DEXTROSE 5 % IV SOLN
INTRAVENOUS | Status: DC | PRN
  Administered 2011-09-22: 500 mL via INTRAVENOUS

## 2011-09-22 MED ORDER — ONDANSETRON HCL 8 MG PO TABS: 8.0000 mg | ORAL_TABLET | Freq: Three times a day (TID) | ORAL | Status: AC | PRN

## 2011-09-22 MED ORDER — LIDOCAINE HCL (CARDIAC) 10 MG/ML IV SOLN
INTRAVENOUS | Status: DC | PRN
  Administered 2011-09-22: 20 mg via INTRAVENOUS

## 2011-09-22 MED ORDER — MIDAZOLAM HCL 2 MG/2ML IJ SOLN
1.0000 mg | INTRAMUSCULAR | Status: DC | PRN
  Administered 2011-09-22: 2 mg via INTRAVENOUS

## 2011-09-22 MED ORDER — PROPOFOL 10 MG/ML IV EMUL
INTRAVENOUS | Status: AC
  Filled 2011-09-22: qty 20

## 2011-09-22 MED ORDER — SODIUM CHLORIDE 0.9 % IR SOLN
Status: DC | PRN
  Administered 2011-09-22: 1000 mL
  Administered 2011-09-22: 3000 mL

## 2011-09-22 MED ORDER — ONDANSETRON HCL 4 MG/2ML IJ SOLN
4.0000 mg | Freq: Once | INTRAMUSCULAR | Status: AC | PRN
  Administered 2011-09-22: 4 mg via INTRAVENOUS

## 2011-09-22 MED ORDER — ONDANSETRON HCL 4 MG/2ML IJ SOLN
4.0000 mg | Freq: Once | INTRAMUSCULAR | Status: AC
  Administered 2011-09-22: 4 mg via INTRAVENOUS

## 2011-09-22 MED ORDER — CEFAZOLIN SODIUM 1-5 GM-% IV SOLN
1.0000 g | INTRAVENOUS | Status: AC
  Administered 2011-09-22: 1 g via INTRAVENOUS

## 2011-09-22 MED ORDER — LACTATED RINGERS IV SOLN
INTRAVENOUS | Status: DC
  Administered 2011-09-22: 09:00:00 via INTRAVENOUS
  Administered 2011-09-22: 1000 mL via INTRAVENOUS

## 2011-09-22 MED ORDER — CEFAZOLIN SODIUM 1-5 GM-% IV SOLN
INTRAVENOUS | Status: AC
  Filled 2011-09-22: qty 50

## 2011-09-22 SURGICAL SUPPLY — 26 items
BAG DECANTER FOR FLEXI CONT (MISCELLANEOUS) ×2 IMPLANT
BAG HAMPER (MISCELLANEOUS) ×2 IMPLANT
CATH THERMACHOICE III (CATHETERS) ×2 IMPLANT
CLOTH BEACON ORANGE TIMEOUT ST (SAFETY) ×2 IMPLANT
COVER SURGICAL LIGHT HANDLE (MISCELLANEOUS) ×4 IMPLANT
FORMALIN 10 PREFIL 120ML (MISCELLANEOUS) ×2 IMPLANT
GAUZE SPONGE 4X4 16PLY XRAY LF (GAUZE/BANDAGES/DRESSINGS) ×2 IMPLANT
GLOVE BIOGEL PI IND STRL 8 (GLOVE) ×1 IMPLANT
GLOVE BIOGEL PI INDICATOR 8 (GLOVE) ×1
GLOVE ECLIPSE 8.0 STRL XLNG CF (GLOVE) ×2 IMPLANT
GOWN STRL REIN XL XLG (GOWN DISPOSABLE) ×4 IMPLANT
INST SET HYSTEROSCOPY (KITS) ×2 IMPLANT
IV D5W 500ML (IV SOLUTION) ×2 IMPLANT
IV NS IRRIG 3000ML ARTHROMATIC (IV SOLUTION) ×2 IMPLANT
KIT ROOM TURNOVER APOR (KITS) ×2 IMPLANT
MANIFOLD NEPTUNE II (INSTRUMENTS) ×2 IMPLANT
MARKER SKIN DUAL TIP RULER LAB (MISCELLANEOUS) ×2 IMPLANT
NS IRRIG 1000ML POUR BTL (IV SOLUTION) ×2 IMPLANT
PACK BASIC III (CUSTOM PROCEDURE TRAY) ×1
PACK SRG BSC III STRL LF ECLPS (CUSTOM PROCEDURE TRAY) ×1 IMPLANT
PAD ARMBOARD 7.5X6 YLW CONV (MISCELLANEOUS) ×2 IMPLANT
PAD TELFA 3X4 1S STER (GAUZE/BANDAGES/DRESSINGS) ×2 IMPLANT
SET BASIN LINEN APH (SET/KITS/TRAYS/PACK) ×2 IMPLANT
SET IRRIG Y TYPE TUR BLADDER L (SET/KITS/TRAYS/PACK) ×2 IMPLANT
SHEET LAVH (DRAPES) ×2 IMPLANT
YANKAUER SUCT BULB TIP 10FT TU (MISCELLANEOUS) ×2 IMPLANT

## 2011-09-22 NOTE — Anesthesia Preprocedure Evaluation (Signed)
Anesthesia Evaluation  Patient identified by MRN, date of birth, ID band Patient awake    Reviewed: Allergy & Precautions, H&P , NPO status , Patient's Chart, lab work & pertinent test results  Airway Mallampati: I TM Distance: >3 FB Neck ROM: Full    Dental No notable dental hx.    Pulmonary Current Smoker,    Pulmonary exam normal       Cardiovascular negative cardio ROS  Rhythm:Regular Rate:Normal     Neuro/Psych PSYCHIATRIC DISORDERS Anxiety Depression    GI/Hepatic negative GI ROS, Neg liver ROS,   Endo/Other  negative endocrine ROS  Renal/GU negative Renal ROS     Musculoskeletal negative musculoskeletal ROS (+)   Abdominal Normal abdominal exam  (+)   Peds  Hematology negative hematology ROS (+)   Anesthesia Other Findings History of pulmonary embolus 01/04/11, on anticoags now, no sequelae. Anticoags stopped prior to surgery.  Reproductive/Obstetrics                           Anesthesia Physical Anesthesia Plan  ASA: II  Anesthesia Plan: General   Post-op Pain Management:    Induction: Intravenous  Airway Management Planned: LMA  Additional Equipment:   Intra-op Plan:   Post-operative Plan: Extubation in OR  Informed Consent: I have reviewed the patients History and Physical, chart, labs and discussed the procedure including the risks, benefits and alternatives for the proposed anesthesia with the patient or authorized representative who has indicated his/her understanding and acceptance.     Plan Discussed with: CRNA  Anesthesia Plan Comments:         Anesthesia Quick Evaluation

## 2011-09-22 NOTE — Op Note (Signed)
Preoperative diagnosis: Menometrorrhagia                                        Dysmenorrhea                                         History of Pulmonary Embolus on Megace  Postoperative diagnoses: Same as above   Procedure: Hysteroscopy, uterine curettage, endometrial ablation  Surgeon: Despina Hidden MD  Anesthesia: Laryngeal mask airway  Findings: The endometrium was normal. There were no fibroid or other abnormalities.  Description of operation: The patient was taken to the operating room and placed in the supine position. She underwent general anesthesia using the laryngeal mask airway. She was placed in the dorsal lithotomy position and prepped and draped in the usual sterile fashion. A Graves speculum was placed and the anterior cervical lip was grasped with a single-tooth tenaculum. The cervix was dilated serially to allow passage of the hysteroscope. Diagnostic hysteroscopy was performed and was found to be normal. A vigorous uterine curettage was then performed and all tissue sent to pathology for evaluation. The ThermaChoice 3 endometrial ablation balloon was then used were 17 cc of D5W was required to maintain a pressure of 190-200 mm of mercury throughout the procedure. Toatl therapy time was 9:09.  All of the equipment worked well throughout the procedure. All of the fluid was returned at the end of the procedure. The patient was awakened from anesthesia and taken to the recovery room in good stable condition all counts were correct. She received 1 g of Ancef and 30 mg of Toradol preoperatively. She will be discharged from the recovery room and followed up in the office in 2 weeks.  Elzada Pytel H 10:00 AM 09/22/2011

## 2011-09-22 NOTE — Anesthesia Postprocedure Evaluation (Signed)
Anesthesia Post Note  Patient: Christina Blake  Procedure(s) Performed: Procedure(s) (LRB): DILATATION & CURETTAGE/HYSTEROSCOPY WITH THERMACHOICE ABLATION (N/A)  Anesthesia type: General  Patient location: PACU  Post pain: Pain level controlled  Post assessment: Post-op Vital signs reviewed, Patient's Cardiovascular Status Stable, Respiratory Function Stable, Patent Airway, No signs of Nausea or vomiting and Pain level controlled  Last Vitals:  Filed Vitals:   09/22/11 1010  BP: 114/72  Pulse: 82  Temp: 36.9 C  Resp: 19    Post vital signs: Reviewed and stable  Level of consciousness: awake and alert   Complications: No apparent anesthesia complications

## 2011-09-22 NOTE — Anesthesia Procedure Notes (Signed)
Procedure Name: LMA Insertion Date/Time: 09/22/2011 9:21 AM Performed by: Franco Nones Pre-anesthesia Checklist: Patient identified, Patient being monitored, Emergency Drugs available, Timeout performed and Suction available Patient Re-evaluated:Patient Re-evaluated prior to inductionOxygen Delivery Method: Circle System Utilized Preoxygenation: Pre-oxygenation with 100% oxygen Intubation Type: IV induction Ventilation: Mask ventilation without difficulty LMA: LMA inserted LMA Size: 4.0 Number of attempts: 1 Placement Confirmation: positive ETCO2 and breath sounds checked- equal and bilateral

## 2011-09-22 NOTE — H&P (Signed)
Christina Blake is an 41 y.o. female with worsening menometrorrhagia and dysmenorrhea who i originally saw last summer for the same.  She was placed on Megestrol for menstrual control and pre operatively before sonogram and ablation and the patient subsequently suffered a pulmonary embolus.  The patient smokes, it turns out is positive for Factor V Leiden and was taking a road trip to [place] when it happened.  She is in for management of continuing heavy and painful periods, also prolonged.  Stopped Coumadin for 1 week on Lovenox last dose yesterday am.  Will do gentle if any curettage.   Patient's last menstrual period was 08/14/2011.    Past Medical History  Diagnosis Date  . Pulmonary embolism   . Hypercholesteremia   . Pulmonary embolism 01/04/2011  . Anxiety   . Depression     Past Surgical History  Procedure Date  . Osteotomy     left foot 2nd toe-[facility]    Family History  Problem Relation Age of Onset  . Anesthesia problems Neg Hx   . Hypotension Neg Hx   . Malignant hyperthermia Neg Hx   . Pseudochol deficiency Neg Hx     Social History:  reports that she has been smoking Cigarettes.  She has a 20 pack-year smoking history. She does not have any smokeless tobacco history on file. She reports that she does not drink alcohol or use illicit drugs.  Allergies: No Known Allergies  Prescriptions prior to admission  Medication Sig Dispense Refill  . ALPRAZolam (XANAX) 0.5 MG tablet Take 0.5 mg by mouth 3 (three) times daily as needed. For anxiety      . enoxaparin (LOVENOX) 100 MG/ML injection Inject 100 mg into the skin daily.      Marland Kitchen lamoTRIgine (LAMICTAL) 200 MG tablet Take 200 mg by mouth at bedtime.        . Multiple Vitamin (MULITIVITAMIN WITH MINERALS) TABS Take 1 tablet by mouth daily.      Marland Kitchen PARoxetine (PAXIL-CR) 37.5 MG 24 hr tablet Take 37.5 mg by mouth every morning.        . traZODone (DESYREL) 100 MG tablet Take 100 mg by mouth at bedtime.       Marland Kitchen warfarin  (COUMADIN) 5 MG tablet Take 10-12.5 mg by mouth as directed. 10mg  on Sat and Sun and 12.5mg  on every other day of the week.        ROS  Review of Systems  Constitutional: Negative for fever, chills, weight loss, malaise/fatigue and diaphoresis.  HENT: Negative for hearing loss, ear pain, nosebleeds, congestion, sore throat, neck pain, tinnitus and ear discharge.   Eyes: Negative for blurred vision, double vision, photophobia, pain, discharge and redness.  Respiratory: Negative for cough, hemoptysis, sputum production, shortness of breath, wheezing and stridor.   Cardiovascular: Negative for chest pain, palpitations, orthopnea, claudication, leg swelling and PND.  Gastrointestinal:Negative for abdominal pain. Negative for heartburn, nausea, vomiting, diarrhea, constipation, blood in stool and melena.  Genitourinary: Negative for dysuria, urgency, frequency, hematuria and flank pain.  Musculoskeletal: Negative for myalgias, back pain, joint pain and falls.  Skin: Negative for itching and rash.  Neurological: Negative for dizziness, tingling, tremors, sensory change, speech change, focal weakness, seizures, loss of consciousness, weakness and headaches.  Endo/Heme/Allergies: Negative for environmental allergies and polydipsia. Does not bruise/bleed easily.  Psychiatric/Behavioral: Negative for depression, suicidal ideas, hallucinations, memory loss and substance abuse. The patient is not nervous/anxious and does not have insomnia.      Blood pressure 106/68,  temperature 98.5 F (36.9 C), temperature source Oral, resp. rate 18, last menstrual period 08/14/2011, SpO2 98.00%. Physical Exam Physical Exam  Vitals reviewed. Constitutional: She is oriented to person, place, and time. She appears well-developed and well-nourished.  HENT:  Head: Normocephalic and atraumatic.  Right Ear: External ear normal.  Left Ear: External ear normal.  Nose: Nose normal.  Mouth/Throat: Oropharynx is clear and  moist.  Eyes: Conjunctivae and EOM are normal. Pupils are equal, round, and reactive to light. Right eye exhibits no discharge. Left eye exhibits no discharge. No scleral icterus.  Neck: Normal range of motion. Neck supple. No tracheal deviation present. No thyromegaly present.  Cardiovascular: Normal rate, regular rhythm, normal heart sounds and intact distal pulses.  Exam reveals no gallop and no friction rub.   No murmur heard. Respiratory: Effort normal and breath sounds normal. No respiratory distress. She has no wheezes. She has no rales. She exhibits no tenderness.  GI: Soft. Bowel sounds are normal. She exhibits no distension and no mass. There is tenderness. There is no rebound and no guarding.  Genitourinary:       Vulva is normal without lesions Vagina is pink moist without discharge Cervix normal in appearance and pap is normal Uterus is Normal in size by sonogram Adnexa is negative with normal sized ovaries by sonogram  Musculoskeletal: Normal range of motion. She exhibits no edema and no tenderness.  Neurological: She is alert and oriented to person, place, and time. She has normal reflexes. She displays normal reflexes. No cranial nerve deficit. She exhibits normal muscle tone. Coordination normal.  Skin: Skin is warm and dry. No rash noted. No erythema. No pallor.  Psychiatric: She has a normal mood and affect. Her behavior is normal. Judgment and thought content normal.   Recent Results (from the past 336 hour(s))  SURGICAL PCR SCREEN   Collection Time   09/18/11  1:57 PM      Component Value Range   MRSA, PCR NEGATIVE  NEGATIVE    Staphylococcus aureus NEGATIVE  NEGATIVE   CBC   Collection Time   09/18/11  2:05 PM      Component Value Range   WBC 7.3  4.0 - 10.5 (K/uL)   RBC 4.47  3.87 - 5.11 (MIL/uL)   Hemoglobin 13.3  12.0 - 15.0 (g/dL)   HCT 16.1  09.6 - 04.5 (%)   MCV 89.9  78.0 - 100.0 (fL)   MCH 29.8  26.0 - 34.0 (pg)   MCHC 33.1  30.0 - 36.0 (g/dL)   RDW  40.9  81.1 - 91.4 (%)   Platelets 232  150 - 400 (K/uL)  COMPREHENSIVE METABOLIC PANEL   Collection Time   09/18/11  2:05 PM      Component Value Range   Sodium 137  135 - 145 (mEq/L)   Potassium 4.1  3.5 - 5.1 (mEq/L)   Chloride 102  96 - 112 (mEq/L)   CO2 25  19 - 32 (mEq/L)   Glucose, Bld 92  70 - 99 (mg/dL)   BUN 8  6 - 23 (mg/dL)   Creatinine, Ser 7.82  0.50 - 1.10 (mg/dL)   Calcium 9.5  8.4 - 95.6 (mg/dL)   Total Protein 7.1  6.0 - 8.3 (g/dL)   Albumin 4.1  3.5 - 5.2 (g/dL)   AST 14  0 - 37 (U/L)   ALT 13  0 - 35 (U/L)   Alkaline Phosphatase 92  39 - 117 (U/L)   Total Bilirubin  0.3  0.3 - 1.2 (mg/dL)   GFR calc non Af Amer >90  >90 (mL/min)   GFR calc Af Amer >90  >90 (mL/min)  HCG, QUANTITATIVE, PREGNANCY   Collection Time   09/18/11  2:30 PM      Component Value Range   hCG, Beta Chain, Quant, S <1  <5 (mIU/mL)  TYPE AND SCREEN   Collection Time   09/18/11  2:30 PM      Component Value Range   ABO/RH(D) AB NEG     Antibody Screen NEG     Sample Expiration 10/02/2011       No results found.  Assessment/Plan: 1.  Menometrorrhagia 2.  Dysmenorrhea 3.  History Of PE on megestrol  Patient understands indications and risks of procedure including post operative clot.  She will restart her lovenox tonight and her coumadin in 3 days. Lazaro Arms 09/22/2011, 8:48 AM

## 2011-09-22 NOTE — Transfer of Care (Signed)
Immediate Anesthesia Transfer of Care Note  Patient: Christina Blake  Procedure(s) Performed: Procedure(s) (LRB): DILATATION & CURETTAGE/HYSTEROSCOPY WITH THERMACHOICE ABLATION (N/A)  Patient Location: PACU  Anesthesia Type: General  Level of Consciousness: awake  Airway & Oxygen Therapy: Patient Spontanous Breathing and non-rebreather face mask  Post-op Assessment: Report given to PACU RN, Post -op Vital signs reviewed and stable and Patient moving all extremities  Post vital signs: Reviewed and stable  Complications: No apparent anesthesia complications

## 2011-09-22 NOTE — Discharge Instructions (Signed)
Endometrial Ablation Endometrial ablation removes the lining of the uterus (endometrium). It is usually a same day, outpatient treatment. Ablation helps avoid major surgery (such as a hysterectomy). A hysterectomy is removal of the cervix and uterus. Endometrial ablation has less risk and complications, has a shorter recovery period and is less expensive. After endometrial ablation, most women will have little or no menstrual bleeding. You may not keep your fertility. Pregnancy is no longer likely after this procedure but if you are pre-menopausal, you still need to use a reliable method of birth control following the procedure because pregnancy can occur. REASONS TO HAVE THE PROCEDURE MAY INCLUDE:  Heavy periods.   Bleeding that is causing anemia.   Anovulatory bleeding, very irregular, bleeding.   Bleeding submucous fibroids (on the lining inside the uterus) if they are smaller than 3 centimeters.  REASONS NOT TO HAVE THE PROCEDURE MAY INCLUDE:  You wish to have more children.   You have a pre-cancerous or cancerous problem. The cause of any abnormal bleeding must be diagnosed before having the procedure.   You have pain coming from the uterus.   You have a submucus fibroid larger than 3 centimeters.   You recently had a baby.   You recently had an infection in the uterus.   You have a severe retro-flexed, tipped uterus and cannot insert the instrument to do the ablation.   You had a Cesarean section or deep major surgery on the uterus.   The inner cavity of the uterus is too large for the endometrial ablation instrument.  RISKS AND COMPLICATIONS   Perforation of the uterus.   Bleeding.   Infection of the uterus, bladder or vagina.   Injury to surrounding organs.   Cutting the cervix.   An air bubble to the lung (air embolus).   Pregnancy following the procedure.   Failure of the procedure to help the problem requiring hysterectomy.   Decreased ability to diagnose  cancer in the lining of the uterus.  BEFORE THE PROCEDURE  The lining of the uterus must be tested to make sure there is no pre-cancerous or cancer cells present.   Medications may be given to make the lining of the uterus thinner.   Ultrasound may be used to evaluate the size and look for abnormalities of the uterus.   Future pregnancy is not desired.  PROCEDURE  There are different ways to destroy the lining of the uterus.   Resectoscope - radio frequency-alternating electric current is the most common one used.   Cryotherapy - freezing the lining of the uterus.   Heated Free Liquid - heated salt (saline) solution inserted into the uterus.   Microwave - uses high energy microwaves in the uterus.   Thermal Balloon - a catheter with a balloon tip is inserted into the uterus and filled with heated fluid.  Your caregiver will talk with you about the method used in this clinic. They will also instruct you on the pros and cons of the procedure. Endometrial ablation is performed along with a procedure called operative hysteroscopy. A narrow viewing tube is inserted through the birth canal (vagina) and through the cervix into the uterus. A tiny camera attached to the viewing tube (hysteroscope) allows the uterine cavity to be shown on a TV monitor during surgery. Your uterus is filled with a harmless liquid to make the procedure easier. The lining of the uterus is then removed. The lining can also be removed with a resectoscope which allows your surgeon   to cut away the lining of the uterus under direct vision. Usually, you will be able to go home within an hour after the procedure. HOME CARE INSTRUCTIONS   Do not drive for 24 hours.   No tampons, douching or intercourse for 2 weeks or until your caregiver approves.   Rest at home for 24 to 48 hours. You may then resume normal activities unless told differently by your caregiver.   Take your temperature two times a day for 4 days, and record  it.   Take any medications your caregiver has ordered, as directed.   Use some form of contraception if you are pre-menopausal and do not want to get pregnant.  Bleeding after the procedure is normal. It varies from light spotting and mildly watery to bloody discharge for 4 to 6 weeks. You may also have mild cramping. Only take over-the-counter or prescription medicines for pain, discomfort, or fever as directed by your caregiver. Do not use aspirin, as this may aggravate bleeding. Frequent urination during the first 24 hours is normal. You will not know how effective your surgery is until at least 3 months after the surgery. SEEK IMMEDIATE MEDICAL CARE IF:   Bleeding is heavier than a normal menstrual cycle.   An oral temperature above 102 F (38.9 C) develops.   You have increasing cramps or pains not relieved with medication or develop belly (abdominal) pain which does not seem to be related to the same area of earlier cramping and pain.   You are light headed, weak or have fainting episodes.   You develop pain in the shoulder strap areas.   You have chest or leg pain.   You have abnormal vaginal discharge.   You have painful urination.  Document Released: 04/28/2004 Document Revised: 06/08/2011 Document Reviewed: 07/27/2007 [employer] Patient Information 2012 [employer]. 

## 2012-03-07 ENCOUNTER — Ambulatory Visit (HOSPITAL_COMMUNITY): Admission: RE | Admit: 2012-03-07 | Payer: BC Managed Care – PPO | Source: Home / Self Care | Admitting: Psychiatry

## 2012-05-24 ENCOUNTER — Other Ambulatory Visit: Payer: Self-pay | Admitting: Family Medicine

## 2012-05-24 DIAGNOSIS — M5430 Sciatica, unspecified side: Secondary | ICD-10-CM

## 2012-05-29 ENCOUNTER — Ambulatory Visit (HOSPITAL_COMMUNITY)
Admission: RE | Admit: 2012-05-29 | Discharge: 2012-05-29 | Disposition: A | Discharge status: Home / Self Care | Payer: BC Managed Care – PPO | Source: Ambulatory Visit | Attending: Family Medicine | Admitting: Family Medicine

## 2012-05-29 ENCOUNTER — Ambulatory Visit (HOSPITAL_COMMUNITY): Payer: BC Managed Care – PPO

## 2012-05-29 DIAGNOSIS — M5430 Sciatica, unspecified side: Secondary | ICD-10-CM

## 2012-05-29 DIAGNOSIS — M545 Low back pain, unspecified: Secondary | ICD-10-CM | POA: Insufficient documentation

## 2012-05-29 DIAGNOSIS — R209 Unspecified disturbances of skin sensation: Secondary | ICD-10-CM | POA: Insufficient documentation

## 2012-06-13 ENCOUNTER — Other Ambulatory Visit: Payer: Self-pay | Admitting: Neurosurgery

## 2012-07-05 ENCOUNTER — Encounter (HOSPITAL_COMMUNITY): Payer: Self-pay | Admitting: Pharmacy Technician

## 2012-07-16 ENCOUNTER — Encounter (HOSPITAL_COMMUNITY): Payer: Self-pay

## 2012-07-16 ENCOUNTER — Encounter (HOSPITAL_COMMUNITY)
Admission: RE | Admit: 2012-07-16 | Discharge: 2012-07-16 | Disposition: A | Discharge status: Home / Self Care | Payer: BC Managed Care – PPO | Source: Ambulatory Visit | Attending: Neurosurgery | Admitting: Neurosurgery

## 2012-07-16 HISTORY — DX: Orthostatic hypotension: I95.1

## 2012-07-16 LAB — TYPE AND SCREEN: Antibody Screen: NEGATIVE

## 2012-07-16 LAB — CBC
MCV: 90.1 fL (ref 78.0–100.0)
Platelets: 172 10*3/uL (ref 150–400)
RDW: 12.8 % (ref 11.5–15.5)
WBC: 6.1 10*3/uL (ref 4.0–10.5)

## 2012-07-16 LAB — SURGICAL PCR SCREEN
MRSA, PCR: NEGATIVE
Staphylococcus aureus: NEGATIVE

## 2012-07-16 LAB — HCG, SERUM, QUALITATIVE: Preg, Serum: NEGATIVE

## 2012-07-16 NOTE — Pre-Procedure Instructions (Signed)
Christina Blake  07/16/2012   Your procedure is scheduled on:  Jul 23, 2012 (Tuesday)  Report to [facility] at 5:30 AM.  Call this number if you have problems the morning of surgery: [phone]   Remember:   Do not eat food or drink liquids after midnight.   Take these medicines the morning of surgery with A SIP OF WATER: xanax as needed, pain pill as needed   Do not wear jewelry, make-up or nail polish.  Do not wear lotions, powders, or perfumes. You may wear deodorant.  Do not shave 48 hours prior to surgery. Men may shave face and neck.  Do not bring valuables to the hospital.  Contacts, dentures or bridgework may not be worn into surgery.  Leave suitcase in the car. After surgery it may be brought to your room.  For patients admitted to the hospital, checkout time is 11:00 AM the day of  discharge.   Patients discharged the day of surgery will not be allowed to drive  home.  Name and phone number of your driver:   Special Instructions: Shower using CHG 2 nights before surgery and the night before surgery.  If you shower the day of surgery use CHG.  Use special wash - you have one bottle of CHG for all showers.  You should use approximately 1/3 of the bottle for each shower.   Please read over the following fact sheets that you were given: Pain Booklet, Coughing and Deep Breathing, Blood Transfusion Information and Surgical Site Infection Prevention

## 2012-07-22 MED ORDER — DEXTROSE 5 % IV SOLN
2.0000 g | INTRAVENOUS | Status: AC
  Administered 2012-07-23: 2 g via INTRAVENOUS

## 2012-07-22 NOTE — H&P (Signed)
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  She notes numbness and tingling in her left hip and left leg.  She says this has been going on much more severely over the last six months but prior to that she has had pain for greater than five years.  She has been taking Lortab on an as needed basis.  She went to the chiropractice and did not get any relief.  She went to a physical therapist and did not get any help.  She has a history of anxiety/depression and had a pulmonary embolus in 12/2010. She has a history of uterine ablation in 2012 and TMJ surgery years ago.    She currently complains of pain in the left buttock and leg involving all her toes and numbness of her foot.   REVIEW OF SYSTEMS:   A detailed Review of Systems sheet was reviewed with the patient.  Pertinent positives include: Musculoskeletal-leg weakness, leg pain; Psychiatric-anxiety and depression.   All other systems are negative; this includes Constitutional symptoms, Eyes, Cardiovascular, Ears, nose, mouth, throat, Endocrine, Respiratory, Gastrointestinal, Genitourinary, Integumentary & Breast, Neurologic, Hematologic/Lymphatic, Allergic/Immunologic.    PAST MEDICAL HISTORY:      Current Medical Conditions:    As previously described.      Prior Operations and Hospitalizations:   As previously described.     Medications and Allergies:  Paxil 50 mg. q.d. for anxiety and depression, Lamotrigine 200 mg. q.d. for depression, Xanax 0.5 mg. t.i.d. for anxiety, and Trazodone 100 mg. q.h.s. for sleep.  She is allergic to Keflex causing yeast infections.       Height and Weight:     5'6", 160 pounds.  BMI 25.5.     SOCIAL HISTORY:    She is a 1 pack per day smoker.  She denies alcohol or drug use.       DIAGNOSTIC STUDIES:   Plain radiographs demonstrate left foraminal stenosis L4-5  and L5-S1 levels.  She has significant disc degeneration at L4-5 and L5-S1 levels.  She also has retrolisthesis of L4 on L5 and at L3-4 Grade 1.   I reviewed the MRI of her lumbar spine and also plain radiographs.  This demonstrates significant nerve root compression and marked foraminal stenosis at both L4-5 and L5-S1  levels with significant nerve root compression at the L5-S1 on the left.  Per my review, I do not believe that the L3-4 level is as severely affected as the L4-5 and L5-S1 levels.    PHYSICAL EXAMINATION:      General Appearance:   On examination today, Mrs. Hogston is a pleasant and cooperative woman in no acute distress.       Blood Pressure, Pulse:     108/70.  Heart rate 78, respirations 18.    HEENT - normocephalic, atraumatic.  The pupils are equal, round and reactive to light.  The extraocular muscles are intact.  Sclerae - white.  Conjunctiva - pink.  Oropharynx benign.  Uvula midline.     Neck - there are no masses, meningismus, deformities, tracheal deviation, jugular vein distention or carotid bruits.  There is normal cervical range of motion.  Spurlings' test is negative without reproducible radicular pain turning the patient's head to either side.  Lhermitte's sign is not present with axial compression.      Respiratory -  there is normal respiratory effort with good intercostal function.  Lungs are clear to auscultation.  There are no rales, rhonchi or wheezes.      Cardiovascular - the heart has regular rate and rhythm to auscultation.  No murmurs are appreciated.  There is no extremity edema, cyanosis or clubbing.  There are palpable pedal pulses.      Abdomen - soft, nontender, no hepatosplenomegaly appreciated or masses.  There are active bowel sounds.  No guarding or rebound.      Musculoskeletal Examination - she has left sciatic notch discomfort and walks with an antalgic gait favoring the left lower extremity.  She is able to bend to touch her toes and able  to stand on her heels and toes.  She has a positive straight leg raise on the left at 60 degrees.    NEUROLOGICAL EXAMINATION: The patient is oriented to time, person and place and has good recall of both recent and remote memory with normal attention span and concentration.    The patient speaks with clear and fluent speech and exhibits normal language function and appropriate fund of knowledge.      Cranial Nerve Examination - pupils are equal, round and reactive to light.  Extraocular movements are full.  Visual fields are full to confrontational testing.  Facial sensation and facial movement are symmetric and intact.  Hearing is intact to finger rub.  Palate is upgoing.  Shoulder shrug is symmetric.  Tongue protrudes in the midline.      Motor Examination - motor strength is 5/5 in the bilateral deltoids, biceps, triceps, handgrips, wrist extensors, interosseous.  In the lower extremities motor strength is 5/5 in hip flexion, extension, quadriceps, hamstrings, plantar flexion, dorsiflexion and extensor hallucis longus with the exception of left extensor hallucis longus strength at 4/5 and left hip abductor at 4/5.    Sensory Examination - she has decreased pin sensation in the left L5 distribution.      Deep Tendon Reflexes - 2 in the biceps, triceps, and brachioradialis, 2 in the knees, 2 in the ankles.  The great toes are downgoing to plantar stimulation.  No pathologic reflexes.       Cerebellar Examination - normal coordination in upper and lower extremities and normal rapid alternating movements.  Romberg test is negative.    IMPRESSION AND RECOMMENDATIONS: Sherrey North is a 42 year old woman with low back and left leg pain.  I recommended that she could a left L4-5 selective nerve root block to give her some relief of her discomfort but I do think she should undergo surgery and this should be done on an expedited basis due to the significant weakness and severity of the nerve root  compression.  I recommended this be done as a transforaminal lumbar interbody fusion at L4-5 and L5-S1 levels.  She is aware of the potential risk of L3-4 but I do not believe this is severely affected at this time such that it warrants surgery at that level. I have also advised her of the importance of stopping smoking.  I have given her  prescription for Chantix.  I have given her a prescription for pain medication as well.    Risks and benefits were discussed in detail with the patient regarding the surgery and she wishes to proceed.  Models were also reviewed with the patient.  I went over the surgical procedure with the patient in great detail today.  I went over the diagnostic studies, as well as surgical models.  We discussed the potential risks and benefits of the surgery, as well as expected hospital course.  The patient would generally wear a brace for three months after surgery.  The risks include, but are not limited to, bleeding, possible need for transfusion, infection, damage to nerves and vessels, risks of anesthesia, dural tear, injury to lumbar nerve roots causing either temporary or permanent leg pain, numbness or weakness, malpositioning of instrumentation, fusion failure, failure to relieve pain, back pain after surgery, recurrent disc herniation, worsening of pain and adjacent degeneration after a spinal fusion.  Also, the potential for the need for further surgery in the event of incomplete fusion.  We also discussed the risks of injury to abdominal structures including bowel, iliac artery or vein injury.     [facility]    Danae Orleans. Venetia Maxon, M.D.  JDS:gde  cc: Dr. Lubertha South

## 2012-07-23 ENCOUNTER — Ambulatory Visit (HOSPITAL_COMMUNITY): Payer: BC Managed Care – PPO | Admitting: Anesthesiology

## 2012-07-23 ENCOUNTER — Encounter (HOSPITAL_COMMUNITY): Payer: Self-pay | Admitting: Anesthesiology

## 2012-07-23 ENCOUNTER — Ambulatory Visit (HOSPITAL_COMMUNITY): Payer: BC Managed Care – PPO

## 2012-07-23 ENCOUNTER — Inpatient Hospital Stay (HOSPITAL_COMMUNITY)
Admission: RE | Admit: 2012-07-23 | Discharge: 2012-07-26 | DRG: 756 | Disposition: A | Discharge status: Home / Self Care | Payer: BC Managed Care – PPO | Source: Ambulatory Visit | Attending: Neurosurgery | Admitting: Neurosurgery

## 2012-07-23 ENCOUNTER — Encounter (HOSPITAL_COMMUNITY): Payer: Self-pay | Admitting: *Deleted

## 2012-07-23 ENCOUNTER — Encounter (HOSPITAL_COMMUNITY)
Admission: RE | Disposition: A | Discharge status: Home / Self Care | Payer: Self-pay | Source: Ambulatory Visit | Attending: Neurosurgery

## 2012-07-23 DIAGNOSIS — F411 Generalized anxiety disorder: Secondary | ICD-10-CM | POA: Diagnosis present

## 2012-07-23 DIAGNOSIS — F17200 Nicotine dependence, unspecified, uncomplicated: Secondary | ICD-10-CM | POA: Diagnosis present

## 2012-07-23 DIAGNOSIS — M51379 Other intervertebral disc degeneration, lumbosacral region without mention of lumbar back pain or lower extremity pain: Secondary | ICD-10-CM | POA: Diagnosis present

## 2012-07-23 DIAGNOSIS — Z01812 Encounter for preprocedural laboratory examination: Secondary | ICD-10-CM

## 2012-07-23 DIAGNOSIS — M47817 Spondylosis without myelopathy or radiculopathy, lumbosacral region: Principal | ICD-10-CM | POA: Diagnosis present

## 2012-07-23 DIAGNOSIS — F3289 Other specified depressive episodes: Secondary | ICD-10-CM | POA: Diagnosis present

## 2012-07-23 DIAGNOSIS — M5126 Other intervertebral disc displacement, lumbar region: Secondary | ICD-10-CM | POA: Diagnosis present

## 2012-07-23 DIAGNOSIS — M5137 Other intervertebral disc degeneration, lumbosacral region: Secondary | ICD-10-CM | POA: Diagnosis present

## 2012-07-23 DIAGNOSIS — F329 Major depressive disorder, single episode, unspecified: Secondary | ICD-10-CM | POA: Diagnosis present

## 2012-07-23 HISTORY — PX: OTHER SURGICAL HISTORY: SHX169

## 2012-07-23 LAB — CBC
Platelets: 181 10*3/uL (ref 150–400)
RBC: 3.93 MIL/uL (ref 3.87–5.11)
WBC: 11.7 10*3/uL — ABNORMAL HIGH (ref 4.0–10.5)

## 2012-07-23 SURGERY — POSTERIOR LUMBAR FUSION 2 LEVEL
Anesthesia: General | Site: Spine Lumbar | Wound class: Clean

## 2012-07-23 MED ORDER — SENNA 8.6 MG PO TABS
1.0000 | ORAL_TABLET | Freq: Two times a day (BID) | ORAL | Status: DC
  Administered 2012-07-24 – 2012-07-26 (×5): 8.6 mg via ORAL
  Filled 2012-07-23 (×6): qty 1

## 2012-07-23 MED ORDER — DEXMEDETOMIDINE HCL IN NACL 200 MCG/50ML IV SOLN: 0.4000 ug/kg/h | INTRAVENOUS | Status: DC

## 2012-07-23 MED ORDER — SODIUM CHLORIDE 0.9 % IJ SOLN: 9.0000 mL | INTRAMUSCULAR | Status: DC | PRN

## 2012-07-23 MED ORDER — DIPHENHYDRAMINE HCL 12.5 MG/5ML PO ELIX: 12.5000 mg | ORAL_SOLUTION | Freq: Four times a day (QID) | ORAL | Status: DC | PRN

## 2012-07-23 MED ORDER — FLEET ENEMA 7-19 GM/118ML RE ENEM: 1.0000 | ENEMA | Freq: Once | RECTAL | Status: AC | PRN

## 2012-07-23 MED ORDER — HYDROCODONE-ACETAMINOPHEN 5-325 MG PO TABS: 1.0000 | ORAL_TABLET | Freq: Four times a day (QID) | ORAL | Status: DC | PRN

## 2012-07-23 MED ORDER — PHENYLEPHRINE HCL 10 MG/ML IJ SOLN
INTRAMUSCULAR | Status: DC | PRN
  Administered 2012-07-23 (×2): 40 ug via INTRAVENOUS
  Administered 2012-07-23 (×3): 80 ug via INTRAVENOUS
  Administered 2012-07-23 (×2): 40 ug via INTRAVENOUS

## 2012-07-23 MED ORDER — PROPOFOL 10 MG/ML IV BOLUS
INTRAVENOUS | Status: DC | PRN
  Administered 2012-07-23: 120 mg via INTRAVENOUS
  Administered 2012-07-23: 20 mg via INTRAVENOUS

## 2012-07-23 MED ORDER — ONDANSETRON HCL 4 MG/2ML IJ SOLN: 4.0000 mg | Freq: Four times a day (QID) | INTRAMUSCULAR | Status: DC | PRN

## 2012-07-23 MED ORDER — PAROXETINE HCL ER 25 MG PO TB24
50.0000 mg | ORAL_TABLET | ORAL | Status: DC
  Administered 2012-07-24 – 2012-07-26 (×3): 50 mg via ORAL
  Filled 2012-07-23 (×4): qty 2

## 2012-07-23 MED ORDER — DIAZEPAM 5 MG PO TABS
5.0000 mg | ORAL_TABLET | Freq: Four times a day (QID) | ORAL | Status: DC | PRN
  Administered 2012-07-23 – 2012-07-24 (×2): 5 mg via ORAL
  Filled 2012-07-23 (×3): qty 1

## 2012-07-23 MED ORDER — KCL IN DEXTROSE-NACL 20-5-0.45 MEQ/L-%-% IV SOLN
INTRAVENOUS | Status: DC
  Administered 2012-07-23 – 2012-07-24 (×2): via INTRAVENOUS
  Filled 2012-07-23 (×6): qty 1000

## 2012-07-23 MED ORDER — OXYCODONE-ACETAMINOPHEN 5-325 MG PO TABS
1.0000 | ORAL_TABLET | ORAL | Status: DC | PRN
  Administered 2012-07-23 – 2012-07-26 (×11): 2 via ORAL
  Filled 2012-07-23 (×11): qty 2

## 2012-07-23 MED ORDER — ALPRAZOLAM 0.5 MG PO TABS
0.5000 mg | ORAL_TABLET | Freq: Three times a day (TID) | ORAL | Status: DC | PRN
  Administered 2012-07-24: 0.5 mg via ORAL
  Filled 2012-07-23 (×2): qty 1

## 2012-07-23 MED ORDER — PHENOL 1.4 % MT LIQD: 1.0000 | OROMUCOSAL | Status: DC | PRN

## 2012-07-23 MED ORDER — MORPHINE SULFATE (PF) 1 MG/ML IV SOLN
INTRAVENOUS | Status: DC
  Administered 2012-07-23: 16:00:00 via INTRAVENOUS

## 2012-07-23 MED ORDER — NEOSTIGMINE METHYLSULFATE 1 MG/ML IJ SOLN
INTRAMUSCULAR | Status: DC | PRN
  Administered 2012-07-23: 3 mg via INTRAVENOUS

## 2012-07-23 MED ORDER — HYDROMORPHONE HCL PF 1 MG/ML IJ SOLN
INTRAMUSCULAR | Status: AC
  Administered 2012-07-23: 0.5 mg
  Filled 2012-07-23: qty 1

## 2012-07-23 MED ORDER — HYDROMORPHONE HCL PF 1 MG/ML IJ SOLN
INTRAMUSCULAR | Status: AC
  Filled 2012-07-23: qty 1

## 2012-07-23 MED ORDER — CALCIUM CARBONATE 1250 (500 CA) MG PO TABS
1.0000 | ORAL_TABLET | Freq: Two times a day (BID) | ORAL | Status: DC
  Administered 2012-07-24 – 2012-07-26 (×5): 500 mg via ORAL
  Filled 2012-07-23 (×7): qty 1

## 2012-07-23 MED ORDER — DIAZEPAM 5 MG PO TABS
5.0000 mg | ORAL_TABLET | Freq: Four times a day (QID) | ORAL | Status: DC | PRN
  Administered 2012-07-24 – 2012-07-26 (×5): 10 mg via ORAL
  Filled 2012-07-23: qty 1
  Filled 2012-07-23 (×5): qty 2

## 2012-07-23 MED ORDER — MORPHINE SULFATE (PF) 1 MG/ML IV SOLN
INTRAVENOUS | Status: AC
  Filled 2012-07-23: qty 25

## 2012-07-23 MED ORDER — BUPIVACAINE HCL (PF) 0.5 % IJ SOLN
INTRAMUSCULAR | Status: DC | PRN
  Administered 2012-07-23: 5 mL

## 2012-07-23 MED ORDER — HYDROMORPHONE HCL PF 1 MG/ML IJ SOLN
INTRAMUSCULAR | Status: DC | PRN
  Administered 2012-07-23: 1 mg via INTRAVENOUS

## 2012-07-23 MED ORDER — SODIUM CHLORIDE 0.9 % IR SOLN
Status: DC | PRN
  Administered 2012-07-23: 12:00:00

## 2012-07-23 MED ORDER — DIPHENHYDRAMINE HCL 50 MG/ML IJ SOLN: 12.5000 mg | Freq: Four times a day (QID) | INTRAMUSCULAR | Status: DC | PRN

## 2012-07-23 MED ORDER — ACETAMINOPHEN 10 MG/ML IV SOLN: 1000.0000 mg | Freq: Once | INTRAVENOUS | Status: DC | PRN

## 2012-07-23 MED ORDER — LAMOTRIGINE 150 MG PO TABS
300.0000 mg | ORAL_TABLET | Freq: Every day | ORAL | Status: DC
  Administered 2012-07-23 – 2012-07-25 (×3): 300 mg via ORAL
  Filled 2012-07-23 (×4): qty 2

## 2012-07-23 MED ORDER — ROCURONIUM BROMIDE 100 MG/10ML IV SOLN
INTRAVENOUS | Status: DC | PRN
  Administered 2012-07-23: 10 mg via INTRAVENOUS
  Administered 2012-07-23: 20 mg via INTRAVENOUS
  Administered 2012-07-23: 50 mg via INTRAVENOUS

## 2012-07-23 MED ORDER — FENTANYL CITRATE 0.05 MG/ML IJ SOLN
INTRAMUSCULAR | Status: DC | PRN
  Administered 2012-07-23: 100 ug via INTRAVENOUS
  Administered 2012-07-23 (×3): 50 ug via INTRAVENOUS

## 2012-07-23 MED ORDER — ACETAMINOPHEN 650 MG RE SUPP: 650.0000 mg | RECTAL | Status: DC | PRN

## 2012-07-23 MED ORDER — NALOXONE HCL 0.4 MG/ML IJ SOLN: 0.4000 mg | INTRAMUSCULAR | Status: DC | PRN

## 2012-07-23 MED ORDER — ONDANSETRON HCL 4 MG/2ML IJ SOLN: 4.0000 mg | Freq: Once | INTRAMUSCULAR | Status: DC | PRN

## 2012-07-23 MED ORDER — DOCUSATE SODIUM 100 MG PO CAPS
100.0000 mg | ORAL_CAPSULE | Freq: Two times a day (BID) | ORAL | Status: DC
  Administered 2012-07-23 – 2012-07-26 (×6): 100 mg via ORAL
  Filled 2012-07-23 (×4): qty 1

## 2012-07-23 MED ORDER — ONDANSETRON HCL 4 MG/2ML IJ SOLN
4.0000 mg | INTRAMUSCULAR | Status: DC | PRN
Start: 2012-07-23 — End: 2012-07-26

## 2012-07-23 MED ORDER — THROMBIN 20000 UNITS EX SOLR
CUTANEOUS | Status: DC | PRN
  Administered 2012-07-23: 12:00:00 via TOPICAL

## 2012-07-23 MED ORDER — SENNOSIDES-DOCUSATE SODIUM 8.6-50 MG PO TABS
1.0000 | ORAL_TABLET | Freq: Every evening | ORAL | Status: DC | PRN
  Administered 2012-07-23: 1 via ORAL
  Filled 2012-07-23: qty 1

## 2012-07-23 MED ORDER — CEFAZOLIN SODIUM-DEXTROSE 2-3 GM-% IV SOLR
INTRAVENOUS | Status: AC
  Filled 2012-07-23: qty 50

## 2012-07-23 MED ORDER — CALCIUM CARBONATE 600 MG PO TABS
600.0000 mg | ORAL_TABLET | Freq: Two times a day (BID) | ORAL | Status: DC
  Filled 2012-07-23: qty 1

## 2012-07-23 MED ORDER — ACETAMINOPHEN 10 MG/ML IV SOLN
INTRAVENOUS | Status: AC
  Administered 2012-07-23: 1000 mg via INTRAVENOUS
  Filled 2012-07-23: qty 100

## 2012-07-23 MED ORDER — SODIUM CHLORIDE 0.9 % IV SOLN
INTRAVENOUS | Status: AC
  Filled 2012-07-23: qty 500

## 2012-07-23 MED ORDER — ONDANSETRON HCL 4 MG/2ML IJ SOLN
INTRAMUSCULAR | Status: DC | PRN
  Administered 2012-07-23: 4 mg via INTRAVENOUS

## 2012-07-23 MED ORDER — PANTOPRAZOLE SODIUM 40 MG IV SOLR
40.0000 mg | Freq: Every day | INTRAVENOUS | Status: AC
  Administered 2012-07-24: 40 mg via INTRAVENOUS
  Filled 2012-07-23 (×2): qty 40

## 2012-07-23 MED ORDER — HYDROMORPHONE HCL PF 1 MG/ML IJ SOLN
0.2500 mg | INTRAMUSCULAR | Status: DC | PRN
  Administered 2012-07-23 (×2): 0.5 mg via INTRAVENOUS

## 2012-07-23 MED ORDER — ACETAMINOPHEN 325 MG PO TABS: 650.0000 mg | ORAL_TABLET | ORAL | Status: DC | PRN

## 2012-07-23 MED ORDER — SODIUM CHLORIDE 0.9 % IJ SOLN: 3.0000 mL | INTRAMUSCULAR | Status: DC | PRN

## 2012-07-23 MED ORDER — LACTATED RINGERS IV SOLN
INTRAVENOUS | Status: DC | PRN
  Administered 2012-07-23 (×2): via INTRAVENOUS

## 2012-07-23 MED ORDER — HYDROCODONE-ACETAMINOPHEN 5-325 MG PO TABS: 1.0000 | ORAL_TABLET | ORAL | Status: DC | PRN

## 2012-07-23 MED ORDER — SODIUM CHLORIDE 0.9 % IJ SOLN
3.0000 mL | Freq: Two times a day (BID) | INTRAMUSCULAR | Status: DC
  Administered 2012-07-24 – 2012-07-26 (×3): 3 mL via INTRAVENOUS

## 2012-07-23 MED ORDER — MIDAZOLAM HCL 5 MG/5ML IJ SOLN
INTRAMUSCULAR | Status: DC | PRN
  Administered 2012-07-23 (×2): 2 mg via INTRAVENOUS

## 2012-07-23 MED ORDER — TRAZODONE HCL 100 MG PO TABS
100.0000 mg | ORAL_TABLET | Freq: Every day | ORAL | Status: DC
  Administered 2012-07-23 – 2012-07-25 (×3): 100 mg via ORAL
  Filled 2012-07-23 (×4): qty 1

## 2012-07-23 MED ORDER — BACITRACIN 50000 UNITS IM SOLR
INTRAMUSCULAR | Status: AC
  Filled 2012-07-23: qty 1

## 2012-07-23 MED ORDER — CEFAZOLIN SODIUM 1-5 GM-% IV SOLN
1.0000 g | Freq: Three times a day (TID) | INTRAVENOUS | Status: AC
  Administered 2012-07-23 – 2012-07-24 (×2): 1 g via INTRAVENOUS
  Filled 2012-07-23 (×2): qty 50

## 2012-07-23 MED ORDER — 0.9 % SODIUM CHLORIDE (POUR BTL) OPTIME
TOPICAL | Status: DC | PRN
  Administered 2012-07-23: 1000 mL

## 2012-07-23 MED ORDER — SODIUM CHLORIDE 0.9 % IV SOLN: 250.0000 mL | INTRAVENOUS | Status: DC

## 2012-07-23 MED ORDER — MENTHOL 3 MG MT LOZG: 1.0000 | LOZENGE | OROMUCOSAL | Status: DC | PRN

## 2012-07-23 MED ORDER — DEXMEDETOMIDINE HCL IN NACL 200 MCG/50ML IV SOLN
0.4000 ug/kg/h | INTRAVENOUS | Status: DC
  Filled 2012-07-23: qty 50

## 2012-07-23 MED ORDER — LIDOCAINE-EPINEPHRINE 1 %-1:100000 IJ SOLN
INTRAMUSCULAR | Status: DC | PRN
  Administered 2012-07-23: 5 mL

## 2012-07-23 MED ORDER — GLYCOPYRROLATE 0.2 MG/ML IJ SOLN
INTRAMUSCULAR | Status: DC | PRN
  Administered 2012-07-23: 0.4 mg via INTRAVENOUS

## 2012-07-23 MED ORDER — MORPHINE SULFATE (PF) 1 MG/ML IV SOLN
INTRAVENOUS | Status: DC
  Administered 2012-07-23: 13.5 mg via INTRAVENOUS
  Administered 2012-07-23: 19:00:00 via INTRAVENOUS
  Administered 2012-07-24: 8 mg via INTRAVENOUS
  Administered 2012-07-24: 22 mg via INTRAVENOUS
  Administered 2012-07-24: 10 mg via INTRAVENOUS
  Administered 2012-07-24: 25 mg via INTRAVENOUS
  Administered 2012-07-24: via INTRAVENOUS
  Filled 2012-07-23 (×2): qty 25

## 2012-07-23 MED ORDER — BISACODYL 10 MG RE SUPP: 10.0000 mg | Freq: Every day | RECTAL | Status: DC | PRN

## 2012-07-23 SURGICAL SUPPLY — 80 items
BAG DECANTER FOR FLEXI CONT (MISCELLANEOUS) ×2 IMPLANT
BENZOIN TINCTURE PRP APPL 2/3 (GAUZE/BANDAGES/DRESSINGS) ×2 IMPLANT
BLADE SURG ROTATE 9660 (MISCELLANEOUS) IMPLANT
BONE VOID FILLER STRIP 10CC (Bone Implant) ×4 IMPLANT
BUR MATCHSTICK NEURO 3.0 LAGG (BURR) ×2 IMPLANT
BUR PRECISION FLUTE 5.0 (BURR) ×2 IMPLANT
CAGE TLIF 10MM (Cage) ×2 IMPLANT
CANISTER SUCTION 2500CC (MISCELLANEOUS) ×2 IMPLANT
CLOTH BEACON ORANGE TIMEOUT ST (SAFETY) ×2 IMPLANT
CONT SPEC 4OZ CLIKSEAL STRL BL (MISCELLANEOUS) ×4 IMPLANT
COVER BACK TABLE 24X17X13 BIG (DRAPES) ×2 IMPLANT
COVER TABLE BACK 60X90 (DRAPES) IMPLANT
DERMABOND ADVANCED (GAUZE/BANDAGES/DRESSINGS)
DERMABOND ADVANCED .7 DNX12 (GAUZE/BANDAGES/DRESSINGS) IMPLANT
DRAPE C-ARM 42X72 X-RAY (DRAPES) ×4 IMPLANT
DRAPE LAPAROTOMY 100X72X124 (DRAPES) ×2 IMPLANT
DRAPE POUCH INSTRU U-SHP 10X18 (DRAPES) ×2 IMPLANT
DRAPE SURG 17X23 STRL (DRAPES) ×2 IMPLANT
DRESSING TELFA 8X3 (GAUZE/BANDAGES/DRESSINGS) ×2 IMPLANT
DURAPREP 26ML APPLICATOR (WOUND CARE) ×2 IMPLANT
ELECT REM PT RETURN 9FT ADLT (ELECTROSURGICAL) ×2
ELECTRODE REM PT RTRN 9FT ADLT (ELECTROSURGICAL) ×1 IMPLANT
GAUZE SPONGE 4X4 16PLY XRAY LF (GAUZE/BANDAGES/DRESSINGS) IMPLANT
GLOVE BIO SURGEON STRL SZ7 (GLOVE) ×2 IMPLANT
GLOVE BIO SURGEON STRL SZ8 (GLOVE) ×4 IMPLANT
GLOVE BIOGEL PI IND STRL 7.5 (GLOVE) ×4 IMPLANT
GLOVE BIOGEL PI IND STRL 8 (GLOVE) ×2 IMPLANT
GLOVE BIOGEL PI IND STRL 8.5 (GLOVE) ×4 IMPLANT
GLOVE BIOGEL PI INDICATOR 7.5 (GLOVE) ×4
GLOVE BIOGEL PI INDICATOR 8 (GLOVE) ×2
GLOVE BIOGEL PI INDICATOR 8.5 (GLOVE) ×4
GLOVE ECLIPSE 7.5 STRL STRAW (GLOVE) ×4 IMPLANT
GLOVE ECLIPSE 8.0 STRL XLNG CF (GLOVE) ×4 IMPLANT
GLOVE EXAM NITRILE LRG STRL (GLOVE) IMPLANT
GLOVE EXAM NITRILE MD LF STRL (GLOVE) IMPLANT
GLOVE EXAM NITRILE XL STR (GLOVE) IMPLANT
GLOVE EXAM NITRILE XS STR PU (GLOVE) IMPLANT
GLOVE SURG SS PI 7.0 STRL IVOR (GLOVE) ×4 IMPLANT
GLOVE SURG SS PI 8.0 STRL IVOR (GLOVE) ×12 IMPLANT
GOWN BRE IMP SLV AUR LG STRL (GOWN DISPOSABLE) ×2 IMPLANT
GOWN BRE IMP SLV AUR XL STRL (GOWN DISPOSABLE) ×6 IMPLANT
GOWN STRL REIN 2XL LVL4 (GOWN DISPOSABLE) ×8 IMPLANT
KIT BASIN OR (CUSTOM PROCEDURE TRAY) ×2 IMPLANT
KIT INFUSE MEDIUM (Orthopedic Implant) ×2 IMPLANT
KIT POSITION SURG JACKSON T1 (MISCELLANEOUS) ×2 IMPLANT
KIT ROOM TURNOVER OR (KITS) ×2 IMPLANT
MILL MEDIUM DISP (BLADE) ×2 IMPLANT
NEEDLE HYPO 25X1 1.5 SAFETY (NEEDLE) ×2 IMPLANT
NEEDLE SPNL 18GX3.5 QUINCKE PK (NEEDLE) IMPLANT
NS IRRIG 1000ML POUR BTL (IV SOLUTION) ×2 IMPLANT
PACK LAMINECTOMY NEURO (CUSTOM PROCEDURE TRAY) ×2 IMPLANT
PAD ARMBOARD 7.5X6 YLW CONV (MISCELLANEOUS) ×6 IMPLANT
PATTIES SURGICAL .5 X.5 (GAUZE/BANDAGES/DRESSINGS) IMPLANT
PATTIES SURGICAL .5 X1 (DISPOSABLE) IMPLANT
PATTIES SURGICAL 1X1 (DISPOSABLE) IMPLANT
ROD 70MM (Rod) ×2 IMPLANT
ROD SPNL 70X5.5XPRECONTOUR (Rod) ×2 IMPLANT
SCREW 40MM (Screw) ×2 IMPLANT
SCREW PEDICLE 45MM (Screw) ×4 IMPLANT
SCREW POLYAX 6.5X45MM (Screw) ×6 IMPLANT
SCREW SET SPINAL STD HEXALOBE (Screw) ×12 IMPLANT
SPONGE GAUZE 4X4 12PLY (GAUZE/BANDAGES/DRESSINGS) IMPLANT
SPONGE LAP 4X18 X RAY DECT (DISPOSABLE) IMPLANT
SPONGE SURGIFOAM ABS GEL 100 (HEMOSTASIS) ×2 IMPLANT
STAPLER SKIN PROX WIDE 3.9 (STAPLE) IMPLANT
STRIP CLOSURE SKIN 1/2X4 (GAUZE/BANDAGES/DRESSINGS) ×2 IMPLANT
SUT VIC AB 1 CT1 18XBRD ANBCTR (SUTURE) ×2 IMPLANT
SUT VIC AB 1 CT1 8-18 (SUTURE) ×2
SUT VIC AB 2-0 CT1 18 (SUTURE) ×4 IMPLANT
SUT VIC AB 3-0 SH 8-18 (SUTURE) ×4 IMPLANT
SYR 20ML ECCENTRIC (SYRINGE) ×2 IMPLANT
SYR 3ML LL SCALE MARK (SYRINGE) ×8 IMPLANT
SYR 5ML LL (SYRINGE) IMPLANT
TAPE CLOTH SURG 4X10 WHT LF (GAUZE/BANDAGES/DRESSINGS) ×2 IMPLANT
TLIF 9MM (Spine Construct) ×2 IMPLANT
TOWEL OR 17X24 6PK STRL BLUE (TOWEL DISPOSABLE) ×2 IMPLANT
TOWEL OR 17X26 10 PK STRL BLUE (TOWEL DISPOSABLE) ×2 IMPLANT
TRAP SPECIMEN MUCOUS 40CC (MISCELLANEOUS) ×2 IMPLANT
TRAY FOLEY CATH 14FRSI W/METER (CATHETERS) IMPLANT
WATER STERILE IRR 1000ML POUR (IV SOLUTION) ×2 IMPLANT

## 2012-07-23 NOTE — Progress Notes (Signed)
Pt has some tremors in legs. Dr.Stern called to bedside to assess. Strength and sensation wnl. No new orders at this time. Will cont to monitor

## 2012-07-23 NOTE — Transfer of Care (Signed)
Immediate Anesthesia Transfer of Care Note  Patient: Christina Blake  Procedure(s) Performed: Procedure(s) (LRB) with comments: POSTERIOR LUMBAR FUSION 2 LEVEL (N/A) - Lumbar four-five, lumbar five-sacral one Transverse Lumbar interbody fusion  Patient Location: PACU  Anesthesia Type:General  Level of Consciousness: awake, alert  and oriented  Airway & Oxygen Therapy: Patient Spontanous Breathing and Patient connected to nasal cannula oxygen  Post-op Assessment: Report given to PACU RN and Post -op Vital signs reviewed and stable  Post vital signs: Reviewed and stable  Complications: No apparent anesthesia complications

## 2012-07-23 NOTE — Op Note (Signed)
07/23/2012  2:19 PM  PATIENT:  Christina Blake  42 y.o. female  PRE-OPERATIVE DIAGNOSIS:  Spondylolisthesis, Lumbar spondylosis, Lumbar degenerative disc herniation, Lumbar radiculopathy L 45 and L 5S1  POST-OPERATIVE DIAGNOSIS:  Spondylolisthesis, Lumbar spondylosis, Lumbar degenerative disc herniation, Lumbar radiculopathy L 45 and L 5S1   PROCEDURE:  Procedure(s) (LRB) with comments: POSTERIOR LUMBAR FUSION 2 LEVEL (N/A) - Lumbar four-five, lumbar five-sacral one Transverse Lumbar interbody fusion with PEEK cages, autograft, allograft, pedicle screw fixation, posterolateral arthrodesis  SURGEON:  Surgeon(s) and Role:    * Maeola Harman, MD - Primary    * Karn Cassis, MD - Assisting  PHYSICIAN ASSISTANT:   ASSISTANTS: Poteat, RN   ANESTHESIA:   general  EBL:  Total I/O In: 1000 [I.V.:1000] Out: 325 [Urine:125; Blood:200]  BLOOD ADMINISTERED:none  DRAINS: (Medium) Hemovact drain(s) in the epidural space with  Suction Open   LOCAL MEDICATIONS USED:  LIDOCAINE   SPECIMEN:  No Specimen  DISPOSITION OF SPECIMEN:  N/A  COUNTS:  YES  TOURNIQUET:  * No tourniquets in log *  DICTATION: DICTATION: Patient is 42 year old woman with  HNP, spondylosis, spondylolisthesis, stenosis, DDD, radiculopathy L4/5, L5/S1. She has a severe left leg pain and weakness. It was elected to take her to surgery for decompression and fusion at L4/5 and L5/S1 levels.    Procedure: Patient was placed in a prone position on the [place] table after smooth and uncomplicated induction of general endotracheal anesthesia. Her low back was prepped and draped in usual sterile fashion with Betadine scrub and DuraPrep. Area of incision was infiltrated with local lidocaine. Incision was made to the lumbodorsal fascia was incised and exposure was performed of the L4/5, L5/S1 spinous processes laminae facet joint and transverse processes. Intraoperative x-ray was obtained which confirmed correct orientation. A  total hemi-laminectomy of L4 and L5 was performed with disarticulation of the left sided facet joints at this level and thorough decompression was performed of both L4, L5 and S1 nerve roots along with the common dural tube. There was densely adherent spondylytic material compressing the thecal sac and both L4 and L5 nerve roots.  Decompression was greater than would be typically performed for simple interbody fusion. A thorough discectomy and preparation of the endplates was performed at both the L4/5 and L5/S1 levels.  The interspaces were packed with medium BMP,NexOss bone graft extender, and PEEK TLIF cages (9mm at L5/S1 and 10mm at L4/5) . Bone autograft was packed within the interspace 3 cc of autograft was placed in the L5/S1 interspace along with an 9mm PEEK TLIF spacer and a 3cc of autograft was placed at L45 along with 10 mm TLIF cage. The posterolateral region was extensively decorticated and pedicle probes were placed at L4, L5 and S1 bilaterally. Intraoperative fluoroscopy confirmed correct orientationin the AP and lateral plane. 40 x 6.5 mm pedicle screws were placed at S1 bilaterally and 45 x 6.5 mm screw was placed at L5 on the right and 40 x 6.5 mm screw at L5 on the left. At L4 45 x 6.5 mm screws were placed bilaterally. Final x-rays demonstrated well-positioned interbody grafts and pedicle screw fixation.70 mm lordotic rods were placed and locked down in situ and the posterolateral region was packed with the remaining bone graft  on the right along with bone graft extender. A medium Hemovac drain was placed and anchored with a stitch.  Fascia was closed with 1 Vicryl sutures skin edges were reapproximated 2 and 3-0 Vicryl sutures. The wound is dressed  with benzoin Steri-Strips Telfa gauze and tape. The patient was extubated in the operating room and taken to recovery in stable satisfactory condition having tolerated the operation well. Counts were correct at the end of the case.    PLAN OF  CARE: Admit to inpatient   PATIENT DISPOSITION:  PACU - hemodynamically stable.   Delay start of Pharmacological VTE agent (>24hrs) due to surgical blood loss or risk of bleeding: yes

## 2012-07-23 NOTE — Brief Op Note (Signed)
07/23/2012  2:19 PM  PATIENT:  Christina Blake  42 y.o. female  PRE-OPERATIVE DIAGNOSIS:  Spondylolisthesis, Lumbar spondylosis, Lumbar degenerative disc herniation, Lumbar radiculopathy L 45 and L 5S1  POST-OPERATIVE DIAGNOSIS:  Spondylolisthesis, Lumbar spondylosis, Lumbar degenerative disc herniation, Lumbar radiculopathy L 45 and L 5S1   PROCEDURE:  Procedure(s) (LRB) with comments: POSTERIOR LUMBAR FUSION 2 LEVEL (N/A) - Lumbar four-five, lumbar five-sacral one Transverse Lumbar interbody fusion with PEEK cages, autograft, allograft, pedicle screw fixation, posterolateral arthrodesis  SURGEON:  Surgeon(s) and Role:    * Lothar Prehn, MD - Primary    * Ernesto M Botero, MD - Assisting  PHYSICIAN ASSISTANT:   ASSISTANTS: Poteat, RN   ANESTHESIA:   general  EBL:  Total I/O In: 1000 [I.V.:1000] Out: 325 [Urine:125; Blood:200]  BLOOD ADMINISTERED:none  DRAINS: (Medium) Hemovact drain(s) in the epidural space with  Suction Open   LOCAL MEDICATIONS USED:  LIDOCAINE   SPECIMEN:  No Specimen  DISPOSITION OF SPECIMEN:  N/A  COUNTS:  YES  TOURNIQUET:  * No tourniquets in log *  DICTATION: DICTATION: Patient is 42-year-old woman with  HNP, spondylosis, spondylolisthesis, stenosis, DDD, radiculopathy L4/5, L5/S1. She has a severe left leg pain and weakness. It was elected to take her to surgery for decompression and fusion at L4/5 and L5/S1 levels.    Procedure: Patient was placed in a prone position on the [place] table after smooth and uncomplicated induction of general endotracheal anesthesia. Her low back was prepped and draped in usual sterile fashion with Betadine scrub and DuraPrep. Area of incision was infiltrated with local lidocaine. Incision was made to the lumbodorsal fascia was incised and exposure was performed of the L4/5, L5/S1 spinous processes laminae facet joint and transverse processes. Intraoperative x-ray was obtained which confirmed correct orientation. A  total hemi-laminectomy of L4 and L5 was performed with disarticulation of the left sided facet joints at this level and thorough decompression was performed of both L4, L5 and S1 nerve roots along with the common dural tube. There was densely adherent spondylytic material compressing the thecal sac and both L4 and L5 nerve roots.  Decompression was greater than would be typically performed for simple interbody fusion. A thorough discectomy and preparation of the endplates was performed at both the L4/5 and L5/S1 levels.  The interspaces were packed with medium BMP,NexOss bone graft extender, and PEEK TLIF cages (9mm at L5/S1 and 10mm at L4/5) . Bone autograft was packed within the interspace 3 cc of autograft was placed in the L5/S1 interspace along with an 9mm PEEK TLIF spacer and a 3cc of autograft was placed at L45 along with 10 mm TLIF cage. The posterolateral region was extensively decorticated and pedicle probes were placed at L4, L5 and S1 bilaterally. Intraoperative fluoroscopy confirmed correct orientationin the AP and lateral plane. 40 x 6.5 mm pedicle screws were placed at S1 bilaterally and 45 x 6.5 mm screw was placed at L5 on the right and 40 x 6.5 mm screw at L5 on the left. At L4 45 x 6.5 mm screws were placed bilaterally. Final x-rays demonstrated well-positioned interbody grafts and pedicle screw fixation.70 mm lordotic rods were placed and locked down in situ and the posterolateral region was packed with the remaining bone graft  on the right along with bone graft extender. A medium Hemovac drain was placed and anchored with a stitch.  Fascia was closed with 1 Vicryl sutures skin edges were reapproximated 2 and 3-0 Vicryl sutures. The wound is dressed   with benzoin Steri-Strips Telfa gauze and tape. The patient was extubated in the operating room and taken to recovery in stable satisfactory condition having tolerated the operation well. Counts were correct at the end of the case.    PLAN OF  CARE: Admit to inpatient   PATIENT DISPOSITION:  PACU - hemodynamically stable.   Delay start of Pharmacological VTE agent (>24hrs) due to surgical blood loss or risk of bleeding: yes  

## 2012-07-23 NOTE — Progress Notes (Signed)
Pt. Arrived from PACU with BP of 82/52 manual. Pulse 50s. Dr. Jeral Fruit notified; new orders received. Will monitor.

## 2012-07-23 NOTE — Interval H&P Note (Signed)
History and Physical Interval Note:  07/23/2012 7:11 AM  Christina Blake  has presented today for surgery, with the diagnosis of Spondylolisthesis, Lumbar spondylosis, Lumbar degenerative disc disease, Lumbar radiculopathy  The various methods of treatment have been discussed with the patient and family. After consideration of risks, benefits and other options for treatment, the patient has consented to  Procedure(s) (LRB) with comments: POSTERIOR LUMBAR FUSION 2 LEVEL (N/A) - L4-5 L5-S1 Transverse Lumbar interbody fusion as a surgical intervention .  The patient's history has been reviewed, patient examined, no change in status, stable for surgery.  I have reviewed the patient's chart and labs.  Questions were answered to the patient's satisfaction.     Syair Fricker D  Date of Initial H&P: 07/22/2012  History reviewed, patient examined, no change in status, stable for surgery.

## 2012-07-23 NOTE — Anesthesia Procedure Notes (Signed)
Procedure Name: Intubation Date/Time: 07/23/2012 11:00 AM Performed by: Arlice Colt B Pre-anesthesia Checklist: Patient identified, Emergency Drugs available, Suction available, Patient being monitored and Timeout performed Patient Re-evaluated:Patient Re-evaluated prior to inductionOxygen Delivery Method: Circle system utilized Preoxygenation: Pre-oxygenation with 100% oxygen Intubation Type: IV induction Ventilation: Mask ventilation without difficulty Laryngoscope Size: Mac and 3 Grade View: Grade I Tube type: Oral Tube size: 7.5 mm Number of attempts: 1 Airway Equipment and Method: Stylet Placement Confirmation: ETT inserted through vocal cords under direct vision,  breath sounds checked- equal and bilateral and positive ETCO2 Secured at: 21 cm Tube secured with: Tape Dental Injury: Teeth and Oropharynx as per pre-operative assessment

## 2012-07-23 NOTE — Anesthesia Preprocedure Evaluation (Addendum)
Anesthesia Evaluation  Patient identified by MRN, date of birth, ID band Patient awake    Reviewed: Allergy & Precautions, H&P , NPO status , Patient's Chart, lab work & pertinent test results  Airway Mallampati: I TM Distance: >3 FB Neck ROM: Full    Dental  (+) Teeth Intact and Dental Advisory Given   Pulmonary Current Smoker,  breath sounds clear to auscultation        Cardiovascular Rhythm:Regular Rate:Normal     Neuro/Psych PSYCHIATRIC DISORDERS Anxiety Depression    GI/Hepatic   Endo/Other    Renal/GU      Musculoskeletal   Abdominal   Peds  Hematology   Anesthesia Other Findings   Reproductive/Obstetrics                          Anesthesia Physical Anesthesia Plan  ASA: II  Anesthesia Plan: General   Post-op Pain Management:    Induction: Intravenous  Airway Management Planned: Oral ETT  Additional Equipment:   Intra-op Plan:   Post-operative Plan: Extubation in OR  Informed Consent: I have reviewed the patients History and Physical, chart, labs and discussed the procedure including the risks, benefits and alternatives for the proposed anesthesia with the patient or authorized representative who has indicated his/her understanding and acceptance.   Dental advisory given  Plan Discussed with: Anesthesiologist, Surgeon and CRNA  Anesthesia Plan Comments:        Anesthesia Quick Evaluation

## 2012-07-23 NOTE — Anesthesia Postprocedure Evaluation (Signed)
  Anesthesia Post-op Note  Patient: Christina Blake  Procedure(s) Performed: Procedure(s) (LRB) with comments: POSTERIOR LUMBAR FUSION 2 LEVEL (N/A) - Lumbar four-five, lumbar five-sacral one Transverse Lumbar interbody fusion  Patient Location: PACU  Anesthesia Type:General  Level of Consciousness: awake, oriented and patient cooperative  Airway and Oxygen Therapy: Patient Spontanous Breathing and Patient connected to nasal cannula oxygen  Post-op Pain: none  Post-op Assessment: Post-op Vital signs reviewed, Patient's Cardiovascular Status Stable and Respiratory Function Stable  Post-op Vital Signs: Reviewed and stable  Complications: No apparent anesthesia complications

## 2012-07-24 MED ORDER — HYDROMORPHONE HCL 2 MG PO TABS
2.0000 mg | ORAL_TABLET | ORAL | Status: DC | PRN
  Administered 2012-07-24: 2 mg via ORAL
  Administered 2012-07-24: 4 mg via ORAL
  Filled 2012-07-24: qty 2
  Filled 2012-07-24: qty 1

## 2012-07-24 MED ORDER — DEXTROSE-NACL 5-0.9 % IV SOLN
INTRAVENOUS | Status: DC
  Administered 2012-07-24 – 2012-07-25 (×2): via INTRAVENOUS

## 2012-07-24 MED ORDER — SODIUM CHLORIDE 0.9 % IV SOLN
Freq: Once | INTRAVENOUS | Status: AC
  Administered 2012-07-24: 05:00:00 via INTRAVENOUS

## 2012-07-24 MED ORDER — PANTOPRAZOLE SODIUM 40 MG PO TBEC
40.0000 mg | DELAYED_RELEASE_TABLET | Freq: Every day | ORAL | Status: DC
  Administered 2012-07-25 – 2012-07-26 (×2): 40 mg via ORAL
  Filled 2012-07-24: qty 1

## 2012-07-24 MED ORDER — KETOROLAC TROMETHAMINE 30 MG/ML IJ SOLN
30.0000 mg | Freq: Three times a day (TID) | INTRAMUSCULAR | Status: AC
  Administered 2012-07-25 – 2012-07-26 (×5): 30 mg via INTRAVENOUS
  Filled 2012-07-24 (×6): qty 1

## 2012-07-24 MED ORDER — SODIUM CHLORIDE 0.9 % IV BOLUS (SEPSIS)
500.0000 mL | Freq: Once | INTRAVENOUS | Status: AC
  Administered 2012-07-24: 500 mL via INTRAVENOUS

## 2012-07-24 MED ORDER — KETOROLAC TROMETHAMINE 30 MG/ML IJ SOLN
30.0000 mg | Freq: Once | INTRAMUSCULAR | Status: AC
  Administered 2012-07-24: 30 mg via INTRAVENOUS
  Filled 2012-07-24: qty 1

## 2012-07-24 MED FILL — Heparin Sodium (Porcine) Inj 1000 Unit/ML: INTRAMUSCULAR | Qty: 30 | Status: AC

## 2012-07-24 MED FILL — Sodium Chloride IV Soln 0.9%: INTRAVENOUS | Qty: 1000 | Status: AC

## 2012-07-24 MED FILL — Sodium Chloride Irrigation Soln 0.9%: Qty: 3000 | Status: AC

## 2012-07-24 NOTE — Progress Notes (Signed)
OT Cancellation Note  Patient Details Name: Christina Blake MRN: [id number] DOB: 1970-11-24   Cancelled Treatment:    Reason Eval/Treat Not Completed: Pain limiting ability to participate; attempted to persuade pt. to at least  Sit EOB; however, pt refused.  Will re-attempt tomorrow  Boykin Reaper [phone] 07/24/2012, 2:38 PM

## 2012-07-24 NOTE — Progress Notes (Signed)
Pt's BP @ 0230 was 79/39 HR-63 and @0430  was 85/50 HR-78, in no acute distress. Dr. Jeral Fruit was notified and ordered to give NS 500 ml x 1 dose. Will continue to monitor.

## 2012-07-24 NOTE — Progress Notes (Signed)
Call placed to Dr. Danielle Dess informed patient continue to have pain 10/10, Blood pressure 90/60.  New order received.

## 2012-07-24 NOTE — Progress Notes (Signed)
PT/OT Cancellation Note  Patient Details Name: Christina Blake MRN: [id number] DOB: 02-25-1971   Cancelled Treatment:    Reason Eval/Treat Not Completed: Pain limiting ability to participate; attempted to persuade pt. to at least  Sit EOB; however, pt refused.  Will re-attempt tomorrow  Jeani Hawking M [phone]/22/2014, 2:38 PM  Lewis Shock, PT, DPT Pager #: [phone] Office #: [phone]

## 2012-07-24 NOTE — Clinical Social Work Note (Signed)
Clinical Social Work   CSW received consult for SNF. CSW reviewed chart and discussed pt with RN during progression. Awaiting PT/OT evals for discharge recommendations. PT/OT evals are needed for insurance prior auth for SNF placement. CSW will assess for SNF, if appropriate. Please call if an urgent need arises. CSW will continue to follow.   Dede Query, MSW, [facility]ors [phone]

## 2012-07-24 NOTE — Progress Notes (Signed)
Subjective: Patient reports quite sore in back  Objective: Vital signs in last 24 hours: Temp:  [97.2 F (36.2 C)-98.8 F (37.1 C)] 98.8 F (37.1 C) (01/22 0620) Pulse Rate:  [48-90] 83  (01/22 0620) Resp:  [7-42] 15  (01/22 0620) BP: (71-108)/(39-77) 93/51 mmHg (01/22 0620) SpO2:  [92 %-100 %] 100 % (01/22 0620) Weight:  [72.666 kg (160 lb 3.2 oz)] 72.666 kg (160 lb 3.2 oz) (01/21 1745)  Intake/Output from previous day: 01/21 0701 - 01/22 0700 In: 2140 [P.O.:240; I.V.:1900] Out: 3685 [Urine:3275; Drains:210; Blood:200] Intake/Output this shift:    Physical Exam: Full strength both lower extremities.  Dressing CDI.  No clonus.  Lab Results:  Basename 07/23/12 1801  WBC 11.7*  HGB 12.3  HCT 35.7*  PLT 181   BMET No results found for this basename: NA:2,K:2,CL:2,CO2:2,GLUCOSE:2,BUN:2,CREATININE:2,CALCIUM:2 in the last 72 hours  Studies/Results: Dg Lumbar Spine 2-3 Views  07/23/2012  *RADIOLOGY REPORT*  Clinical Data: Back pain.  Spondylolisthesis.  LUMBAR SPINE - 2-3 VIEW,DG C-ARM 1-60 MIN  Comparison: Multiple priors.  Findings: C-arm films document L4-S1 posterolateral and interbody fusion.  Improved anterolisthesis L5-S1.  IMPRESSION: As above.   Original Report Authenticated By: Davonna Belling, M.D.    Dg Lumbar Spine 1 View  07/23/2012  *RADIOLOGY REPORT*  Clinical Data: Lumbar fusion.  LUMBAR SPINE - 1 VIEW  Comparison: Plain films lumbar spine 06/12/2012.  Findings: Single intraoperative view of the lumbar spine in the lateral projection is provided.  Three probes are in place.  The most superior is at the upper margin of the L4 pedicles.  Second, more inferior probe is just below the level of the L5 pedicles. The most inferior probe is at the level of mid S1.  IMPRESSION: Localization as above.   Original Report Authenticated By: Holley Dexter, M.D.    Dg C-arm 1-60 Min  07/23/2012  *RADIOLOGY REPORT*  Clinical Data: Back pain.  Spondylolisthesis.  LUMBAR SPINE - 2-3  VIEW,DG C-ARM 1-60 MIN  Comparison: Multiple priors.  Findings: C-arm films document L4-S1 posterolateral and interbody fusion.  Improved anterolisthesis L5-S1.  IMPRESSION: As above.   Original Report Authenticated By: Davonna Belling, M.D.     Assessment/Plan: Sore in back, but mobilizing.  Will D/C Foley when up and D/C PCA when up as well.  Continue drain through today.    LOS: 1 day    Dorian Heckle, MD 07/24/2012, 7:07 AM

## 2012-07-25 HISTORY — PX: OTHER SURGICAL HISTORY: SHX169

## 2012-07-25 NOTE — Progress Notes (Signed)
OT Cancellation Note  Patient Details Name: Christina Blake MRN: [id number] DOB: 10-31-1970   Cancelled Treatment:    Reason Eval/Treat Not Completed: Pain limiting ability to participate;Fatigue/lethargy limiting ability to participate - pt adamantly refusing to get back up stating she just got back to bed.  Will re-attempt tomorrow  Jeani Hawking M [phone] 07/25/2012, 3:24 PM

## 2012-07-25 NOTE — Progress Notes (Signed)
Subjective: Patient reports improving pain  Objective: Vital signs in last 24 hours: Temp:  [98 F (36.7 C)-98.8 F (37.1 C)] 98 F (36.7 C) (01/23 0530) Pulse Rate:  [66-88] 77  (01/23 0530) Resp:  [16-18] 16  (01/23 0530) BP: (72-105)/(34-60) 100/60 mmHg (01/23 0530) SpO2:  [95 %-100 %] 99 % (01/23 0530)  Intake/Output from previous day: 01/22 0701 - 01/23 0700 In: 20.8 [I.V.:20.8] Out: 2080 [Urine:2000; Drains:80] Intake/Output this shift:    Physical Exam: Full strength both legs, no numbness.  Dressing CDI.  Lab Results:  Basename 07/23/12 1801  WBC 11.7*  HGB 12.3  HCT 35.7*  PLT 181   BMET No results found for this basename: NA:2,K:2,CL:2,CO2:2,GLUCOSE:2,BUN:2,CREATININE:2,CALCIUM:2 in the last 72 hours  Studies/Results: Dg Lumbar Spine 2-3 Views  07/23/2012  *RADIOLOGY REPORT*  Clinical Data: Back pain.  Spondylolisthesis.  LUMBAR SPINE - 2-3 VIEW,DG C-ARM 1-60 MIN  Comparison: Multiple priors.  Findings: C-arm films document L4-S1 posterolateral and interbody fusion.  Improved anterolisthesis L5-S1.  IMPRESSION: As above.   Original Report Authenticated By: Davonna Belling, M.D.    Dg Lumbar Spine 1 View  07/23/2012  *RADIOLOGY REPORT*  Clinical Data: Lumbar fusion.  LUMBAR SPINE - 1 VIEW  Comparison: Plain films lumbar spine 06/12/2012.  Findings: Single intraoperative view of the lumbar spine in the lateral projection is provided.  Three probes are in place.  The most superior is at the upper margin of the L4 pedicles.  Second, more inferior probe is just below the level of the L5 pedicles. The most inferior probe is at the level of mid S1.  IMPRESSION: Localization as above.   Original Report Authenticated By: Holley Dexter, M.D.    Dg C-arm 1-60 Min  07/23/2012  *RADIOLOGY REPORT*  Clinical Data: Back pain.  Spondylolisthesis.  LUMBAR SPINE - 2-3 VIEW,DG C-ARM 1-60 MIN  Comparison: Multiple priors.  Findings: C-arm films document L4-S1 posterolateral and  interbody fusion.  Improved anterolisthesis L5-S1.  IMPRESSION: As above.   Original Report Authenticated By: Davonna Belling, M.D.     Assessment/Plan: D/C Foley and drain.  Better pain relief with toredol.  Continue PT.    LOS: 2 days    Dorian Heckle, MD 07/25/2012, 7:23 AM

## 2012-07-25 NOTE — Progress Notes (Signed)
Pt is continuing to refuse foley removal and ambulation this morning against RN request/recommendations.  Pt sat up in bed per RN request, but nothing further.  Will pass onto day RN.

## 2012-07-25 NOTE — Evaluation (Signed)
Physical Therapy Evaluation Patient Details Name: Christina Blake MRN: [id number] DOB: 1970/11/21 Today's Date: 07/25/2012 Time: 4098-1191 PT Time Calculation (min): 30 min  PT Assessment / Plan / Recommendation Clinical Impression  Pt s/p PLF L4-S1. Pt has been hypotensive since surgery, limited this session secondary to dizziness upon standing and decreased BP. Pt will benefit from skilled PT in the acute care setting in order to maximize functional mobility and safety to return to PLOF.    PT Assessment  Patient needs continued PT services    Follow Up Recommendations  No PT follow up;Supervision for mobility/OOB    Does the patient have the potential to tolerate intense rehabilitation      Barriers to Discharge        Equipment Recommendations  Rolling walker with 5" wheels    Recommendations for Other Services     Frequency Min 5X/week    Precautions / Restrictions Precautions Precautions: Back Precaution Booklet Issued: Yes (comment) Precaution Comments: pt educated on 3/3 back precautions Required Braces or Orthoses: Spinal Brace Spinal Brace: Lumbar corset;Applied in sitting position Restrictions Weight Bearing Restrictions: No   Pertinent Vitals/Pain Pain 4/10.  BP Supine: 100/55 BP Sitting: 95/53 BP standing: 90/50      Mobility  Bed Mobility Bed Mobility: Rolling Left;Left Sidelying to Sit;Sitting - Scoot to [place] of Bed;Sit to Sidelying Left Rolling Left: 4: Min assist Left Sidelying to Sit: 4: Min assist Sitting - Scoot to Edge of Bed: 4: Min guard Sit to Sidelying Left: 4: Min assist Details for Bed Mobility Assistance: VC for safe technique to maintain back precautions durign transfer. Min assist through trunk for support Transfers Transfers: Sit to Stand;Stand to Sit Sit to Stand: 4: Min assist;With upper extremity assist;From bed Stand to Sit: 4: Min assist;With upper extremity assist;To bed Details for Transfer Assistance: VC for safe hand  placement for safety to/from RW. Assist for stability. Upon standing for ~3 minutes pt complained of dizziness and returned to supine. See vitals Ambulation/Gait Ambulation/Gait Assistance: Not tested (comment)    Shoulder Instructions     Exercises     PT Diagnosis: Difficulty walking;Acute pain  PT Problem List: Decreased activity tolerance;Decreased mobility;Decreased knowledge of use of DME;Decreased safety awareness;Decreased knowledge of precautions;Pain PT Treatment Interventions: DME instruction;Gait training;Stair training;Functional mobility training;Therapeutic activities;Patient/family education   PT Goals Acute Rehab PT Goals PT Goal Formulation: With patient Time For Goal Achievement: 08/01/12 Potential to Achieve Goals: Fair Pt will go Supine/Side to Sit: with modified independence PT Goal: Supine/Side to Sit - Progress: Goal set today Pt will go Sit to Supine/Side: with modified independence PT Goal: Sit to Supine/Side - Progress: Goal set today Pt will go Sit to Stand: with modified independence PT Goal: Sit to Stand - Progress: Goal set today Pt will go Stand to Sit: with modified independence PT Goal: Stand to Sit - Progress: Goal set today Pt will Transfer Bed to Chair/Chair to Bed: with modified independence PT Transfer Goal: Bed to Chair/Chair to Bed - Progress: Goal set today Pt will Ambulate: >150 feet;with modified independence;with least restrictive assistive device PT Goal: Ambulate - Progress: Goal set today Pt will Go Up / Down Stairs: 1-2 stairs;with min assist;with least restrictive assistive device PT Goal: Up/Down Stairs - Progress: Goal set today Additional Goals Additional Goal #1: Pt will be able to verbalize and maintain 3/3 back precautions PT Goal: Additional Goal #1 - Progress: Goal set today  Visit Information  Last PT Received On: 07/25/12 Assistance Needed: +1  Subjective Data      Prior Functioning  Home Living Lives With:  Spouse;Daughter Available Help at Discharge: Family;Available 24 hours/day Type of Home: House Home Access: Stairs to enter [employer] of Steps: 2 Entrance Stairs-Rails: None Home Layout: One level Bathroom Shower/Tub: Forensic scientist: Handicapped height Bathroom Accessibility: Yes How Accessible: Accessible via walker Home Adaptive Equipment: Shower chair without back Prior Function Level of Independence: Independent Able to Take Stairs?: Yes Driving: Yes Vocation: Full time employment Comments: RN with [employer] Communication: No difficulties Dominant Hand: Right    Cognition  Overall Cognitive Status: Appears within functional limits for tasks assessed/performed Arousal/Alertness: Awake/alert Orientation Level: Appears intact for tasks assessed Behavior During Session: [facility] for tasks performed    Extremity/Trunk Assessment Right Lower Extremity Assessment RLE ROM/Strength/Tone: Within functional levels RLE Sensation: WFL - Light Touch Left Lower Extremity Assessment LLE ROM/Strength/Tone: Within functional levels LLE Sensation: WFL - Light Touch   Balance    End of Session PT - End of Session Equipment Utilized During Treatment: Gait belt;Back brace Activity Tolerance: Other (comment) (low BP) Patient left: in bed;with call bell/phone within reach;with nursing in room Nurse Communication: Mobility status  GP     Milana Kidney 07/25/2012, 9:16 AM  07/25/2012 Milana Kidney DPT PAGER: [phone] OFFICE: [phone]

## 2012-07-25 NOTE — Plan of Care (Signed)
Problem: Phase II Progression Outcomes Goal: PT/OT consults completed Outcome: Not Met (add Reason) Pt refused PT/OT 07/24/12 r/t uncontrolled pain.

## 2012-07-25 NOTE — Progress Notes (Signed)
Removed hemovac at 1320 per MD order.  A small amount of blood oozed from the site.  Held pressure and placed a gauze over the site.  Per patients request I changed the gauze dressing over the steri strip incision. Old dressing had a small amount of old dried blood on it and there was no evidence of bleeding.  Removed foley catheter at 1340 and encouraged patient to drink plenty of fluids. Informed her that she will need to void by 1940.  Patient was able to transfer from the bed to the chair with nursing staff as a 1 assist.  Patient in chair, pain medication given, call bell within reach, will continue to monitor.

## 2012-07-25 NOTE — Progress Notes (Signed)
Pt refuses removal of Foley catheter on POD #1, r/t to her pain being uncontrolled.  Due to hypotension, Dilaudid was held and a one time order for Toradol was written/given, which was effective for pain management.  Discussed with pt receiving an order for scheduled Toradol, and pt agreed to discuss foley removal in the am after Toradol administration.

## 2012-07-25 NOTE — Progress Notes (Signed)
Pt ambulated in hallway with RN using the walker. Walked 100 feet with minimal c/o pain. Assisted pt to [facility], she is voiding well post catheter removal.  Pt expressed concerns over how she will be able to do ADLs at home and I informed her that OT will assist her with that and encouraged her to let them evaluate her in the morning since she has been refusing OT services since admission.  Will continue to monitor. Yulanda Diggs, [place], RN

## 2012-07-25 NOTE — Clinical Social Work Note (Signed)
Clinical Social Work   CSW reviewed chart and discussed pt with RN during progression. PT is not recommending any follow up. RNCM is aware. CSW is signing off as no further needs identified. Please reconsult if a need arises prior to discharge.   Ajwa Kimberley, MSW, LCSWA #[phone] 

## 2012-07-26 NOTE — Care Management Note (Signed)
    Page 1 of 1   07/26/2012     11:26:45 AM   CARE MANAGEMENT NOTE 07/26/2012  Patient:  Christina Blake, Christina Blake   Account Number:  [id number]  Date Initiated:  07/24/2012  Documentation initiated by:  [facility]  Subjective/Objective Assessment:   admitted postop L4-5, L5-S1 PLIF     Action/Plan:   PT/OT evals-no follow up recommended  recommended rolling walker   Anticipated DC Date:  07/26/2012   Anticipated DC Plan:  HOME/SELF CARE      DC Planning Services  CM consult      Choice offered to / List presented to:             Status of service:  Completed, signed off Medicare Important Message given?   (If response is "NO", the following Medicare IM given date fields will be blank) Date Medicare IM given:   Date Additional Medicare IM given:    Discharge Disposition:  HOME/SELF CARE  Per UR Regulation:  Reviewed for med. necessity/level of care/duration of stay  If discussed at Long Length of Stay Meetings, dates discussed:    Comments:  07/26/12 Spoke with patient about equipment, she stated that she has a walker. No equipment needs identified. Jacquelynn Cree RN, BSN, CCM

## 2012-07-26 NOTE — Progress Notes (Signed)
Subjective: Patient reports "I'm ready to go home"  Objective: Vital signs in last 24 hours: Temp:  [98 F (36.7 C)-99.3 F (37.4 C)] 98 F (36.7 C) (01/24 0558) Pulse Rate:  [71-100] 81  (01/24 0558) Resp:  [15-18] 15  (01/24 0558) BP: (85-122)/(50-74) 98/60 mmHg (01/24 0558) SpO2:  [97 %-100 %] 98 % (01/24 0558)  Intake/Output from previous day: 01/23 0701 - 01/24 0700 In: 1000 [P.O.:1000] Out: 880 [Urine:800; Drains:80] Intake/Output this shift:    Alert, conversant, MAEW. Incision without erythema, swelling, or drainage. Good strength BLE. Reports soreness in hips with sitting long periods, Lumbar soreness with bedrest long periods.   Lab Results:  Basename 07/23/12 1801  WBC 11.7*  HGB 12.3  HCT 35.7*  PLT 181   BMET No results found for this basename: NA:2,K:2,CL:2,CO2:2,GLUCOSE:2,BUN:2,CREATININE:2,CALCIUM:2 in the last 72 hours  Studies/Results: No results found.  Assessment/Plan: Improving  LOS: 3 days  Per Dr. Venetia Maxon, d/c IV, d/c to home. Rx's Toradol 10mg  po q4hrs prn pain, Robaxin 500mg TID prn spasm, Oxycodone 5/325 q8hrs prn pain. (Pt agrees with change from Valium to Robaxin d/t recent tearful episodes and mood swings.) Pt verbalizes understanding of d/c instructions. Pt agrees to call office to schedule 3-4wk f/u appt with Dr. Venetia Maxon.   Georgiann Cocker 07/26/2012, 8:15 AM

## 2012-07-26 NOTE — Progress Notes (Signed)
Physical Therapy Treatment Patient Details Name: Christina Blake MRN: [id number] DOB: Jun 24, 1971 Today's Date: 07/26/2012 Time: 4098-1191 PT Time Calculation (min): 28 min  PT Assessment / Plan / Recommendation Comments on Treatment Session  Pt moving well this session, able to tolerate ambulation and stairs. Plan to d/c today. Pt is at a supervision level for all mobility    Follow Up Recommendations  No PT follow up;Supervision for mobility/OOB     Does the patient have the potential to tolerate intense rehabilitation     Barriers to Discharge        Equipment Recommendations  Rolling walker with 5" wheels    Recommendations for Other Services    Frequency Min 5X/week   Plan Discharge plan remains appropriate;Frequency remains appropriate    Precautions / Restrictions Precautions Precautions: Back Precaution Booklet Issued: Yes (comment) Precaution Comments: pt able to recall and maintain 3/3 back precautions Required Braces or Orthoses: Spinal Brace Spinal Brace: Lumbar corset;Applied in sitting position Restrictions Weight Bearing Restrictions: No   Pertinent Vitals/Pain Pain 2/10. Meds given prior to session.     Mobility  Bed Mobility Bed Mobility: Rolling Right;Right Sidelying to Sit;Sitting - Scoot to Edge of Bed Rolling Right: 5: Supervision Right Sidelying to Sit: 5: Supervision Sitting - Scoot to Edge of Bed: 5: Supervision Details for Bed Mobility Assistance: Supervision for safety only. Cues to maintain back precautions with proper technique Transfers Transfers: Sit to Stand;Stand to Sit Sit to Stand: 5: Supervision;With upper extremity assist;From bed;From toilet Stand to Sit: 5: Supervision;With upper extremity assist;To toilet;To bed Details for Transfer Assistance: Supervision for safety. Pt able to control descent and ascent while maintaining back precautions Ambulation/Gait Ambulation/Gait Assistance: 5: Supervision Ambulation Distance (Feet):  100 Feet Assistive device: None;Rolling walker Ambulation/Gait Assistance Details: 20 ft without RW, 100 ft with RW. Supervision only. Cues for safe technique to maintain back precautions and avoid twisting Gait Pattern: Within Functional Limits Gait velocity: slow Stairs: Yes Stairs Assistance: 5: Supervision;4: Min guard Stairs Assistance Details (indicate cue type and reason): VC for safe technique. Minguard with HHA although pt not using for support Stair Management Technique: No rails;Step to pattern;Forwards Number of Stairs: 2     Exercises     PT Diagnosis:    PT Problem List:   PT Treatment Interventions:     PT Goals Acute Rehab PT Goals PT Goal: Supine/Side to Sit - Progress: Progressing toward goal PT Goal: Sit to Supine/Side - Progress: Progressing toward goal PT Goal: Sit to Stand - Progress: Progressing toward goal PT Goal: Stand to Sit - Progress: Progressing toward goal PT Transfer Goal: Bed to Chair/Chair to Bed - Progress: Progressing toward goal PT Goal: Ambulate - Progress: Progressing toward goal PT Goal: Up/Down Stairs - Progress: Progressing toward goal Additional Goals PT Goal: Additional Goal #1 - Progress: Met  Visit Information  Last PT Received On: 07/26/12 Assistance Needed: +1    Subjective Data      Cognition  Overall Cognitive Status: Appears within functional limits for tasks assessed/performed Arousal/Alertness: Awake/alert Orientation Level: Appears intact for tasks assessed Behavior During Session: [facility] for tasks performed    Balance     End of Session PT - End of Session Equipment Utilized During Treatment: Gait belt;Back brace Activity Tolerance: Patient tolerated treatment well Patient left: in bed;with call bell/phone within reach;with nursing in room Nurse Communication: Mobility status   GP     Milana Kidney 07/26/2012, 10:19 AM

## 2012-07-26 NOTE — Progress Notes (Signed)
Pt ambulated in the hall with brace and standby assist with RN.  Assisted to [facility] throughout the night, pt had steady gait and tolerated ambulation well at all times.  Will continue to monitor.

## 2012-07-26 NOTE — Plan of Care (Signed)
Problem: Consults Goal: Diagnosis - Spinal Surgery Outcome: Completed/Met Date Met:  07/26/12 Thoraco/Lumbar Spine Fusion

## 2012-07-26 NOTE — Progress Notes (Signed)
Doing well.  Mobilizing with improving pain.

## 2012-07-26 NOTE — Discharge Summary (Signed)
Physician Discharge Summary  Patient ID: Christina Blake MRN: [id number] DOB/AGE: May 18, 1971 42 y.o.  Admit date: 07/23/2012 Discharge date: 07/26/2012  Admission Diagnoses: Spondylolisthesis, Lumbar spondylosis, Lumbar degenerative disc herniation, Lumbar radiculopathy L 45 and L 5S1    Discharge Diagnoses: Spondylolisthesis, Lumbar spondylosis, Lumbar degenerative disc herniation, Lumbar radiculopathy L 45 and L 5S1 s/p POSTERIOR LUMBAR FUSION 2 LEVEL (N/A) - Lumbar four-five, lumbar five-sacral one Transverse Lumbar interbody fusion with PEEK cages, autograft, allograft, pedicle screw fixation, posterolateral arthrodesis   Active Problems:  * No active hospital problems. *    Discharged Condition: good  Hospital Course: Christina Blake was admitted 07-23-12 for surgery with Dx Spondylolisthesis, Lumbar spondylosis, Lumbar degenerative disc herniation, Lumbar radiculopathy L 45 and L 5S1.  Following uncomplicated L4-5, L5-S1 TLIF, she recovered in Neuro PACU & transferred to 4N for nursing and therapies. She has progressed steadily.    Consults: None  Significant Diagnostic Studies: radiology: X-Ray: Intra-operative  Treatments: surgery: POSTERIOR LUMBAR FUSION 2 LEVEL (N/A) - Lumbar four-five, lumbar five-sacral one Transverse Lumbar interbody fusion with PEEK cages, autograft, allograft, pedicle screw fixation, posterolateral arthrodesis  Discharge Exam: Blood pressure 98/60, pulse 81, temperature 98 F (36.7 C), temperature source Oral, resp. rate 15, height 5\' 6"  (1.676 m), weight 72.666 kg (160 lb 3.2 oz), last menstrual period 07/23/2012, SpO2 98.00%. Alert, conversant, MAEW. Incision without erythema, swelling, or drainage. Good strength BLE. Reports soreness in hips with sitting long periods, Lumbar soreness with bedrest long periods.     Disposition: 01-Home or Self Care Rx's Toradol 10 mg po q4hrs prn pain, Robaxin 500mg  TID prn spasm, Oxycodone 5/325 q8hrs prn pain.  (Hydrocodone will stop)(Pt agrees with change from Valium to Robaxin d/t recent tearful episodes and mood swings.)  Pt verbalizes understanding of d/c instructions. Pt agrees to call office to schedule 3-4wk f/u appt with Dr. Venetia Maxon.       Medication List     As of 07/26/2012  8:23 AM    ASK your doctor about these medications         ALPRAZolam 0.5 MG tablet   Commonly known as: XANAX   Take 0.5 mg by mouth 3 (three) times daily as needed. For anxiety      calcium carbonate 600 MG Tabs   Commonly known as: OS-CAL   Take 600 mg by mouth 2 (two) times daily with a meal.      HYDROcodone-acetaminophen 5-325 MG per tablet   Commonly known as: NORCO/VICODIN   Take 1 tablet by mouth every 6 (six) hours as needed. For pain.      lamoTRIgine 200 MG tablet   Commonly known as: LAMICTAL   Take 300 mg by mouth at bedtime.      PARoxetine 25 MG 24 hr tablet   Commonly known as: PAXIL-CR   Take 50 mg by mouth every morning. Take 50 mg in the evening.      traZODone 100 MG tablet   Commonly known as: DESYREL   Take 100 mg by mouth at bedtime.         Signed: Georgiann Cocker 07/26/2012, 8:23 AM

## 2012-10-31 ENCOUNTER — Encounter: Payer: Self-pay | Admitting: *Deleted

## 2012-11-01 ENCOUNTER — Encounter: Payer: Self-pay | Admitting: Family Medicine

## 2012-11-01 ENCOUNTER — Ambulatory Visit (INDEPENDENT_AMBULATORY_CARE_PROVIDER_SITE_OTHER): Payer: BC Managed Care – PPO | Admitting: Family Medicine

## 2012-11-01 ENCOUNTER — Encounter: Payer: Self-pay | Admitting: *Deleted

## 2012-11-01 VITALS — BP 122/78 | Wt 165.8 lb

## 2012-11-01 DIAGNOSIS — F329 Major depressive disorder, single episode, unspecified: Secondary | ICD-10-CM

## 2012-11-01 DIAGNOSIS — M25559 Pain in unspecified hip: Secondary | ICD-10-CM

## 2012-11-01 DIAGNOSIS — M25552 Pain in left hip: Secondary | ICD-10-CM

## 2012-11-01 DIAGNOSIS — F4001 Agoraphobia with panic disorder: Secondary | ICD-10-CM

## 2012-11-01 NOTE — Progress Notes (Signed)
  Subjective:    Patient ID: Christina Blake, female    DOB: Dec 15, 1970, 42 y.o.   MRN: [id number]  Hip Pain  The incident occurred more than 1 week ago. Injury mechanism: poss sig lifting of grandbaby. The pain is present in the left hip. The quality of the pain is described as aching. The pain is moderate. The pain has been fluctuating since onset. Associated symptoms include an inability to bear weight. She reports no foreign bodies present.   Pain is sharp at times. Just recently recovered from discussed surgery. Reports pain is different.   Review of Systems No vomiting no nausea. No GI symptoms. No numbness or tingling. ROS otherwise negative.    Objective:   Physical Exam  Alert mild distress. Vitals reviewed. HEENT normal. Lungs clear. Heart regular in rhythm. Straight leg raise suboptimum but not true positive findings  Anterior groin tenderness and pain without any noticeable swelling    Assessment & Plan:  Impression groin strain-gallops the neuropathic difficulty discussed. Plan anti-inflammatory medicine prescribed. Pain medicine prescribed. Local measures discussed. Patient due to see her neurosurgeon soon. WSL

## 2012-11-03 DIAGNOSIS — F329 Major depressive disorder, single episode, unspecified: Secondary | ICD-10-CM | POA: Insufficient documentation

## 2012-11-03 DIAGNOSIS — F4001 Agoraphobia with panic disorder: Secondary | ICD-10-CM | POA: Insufficient documentation

## 2012-11-03 DIAGNOSIS — F32A Depression, unspecified: Secondary | ICD-10-CM | POA: Insufficient documentation

## 2013-02-13 ENCOUNTER — Other Ambulatory Visit (HOSPITAL_COMMUNITY): Payer: Self-pay | Admitting: Neurosurgery

## 2013-02-13 DIAGNOSIS — M5412 Radiculopathy, cervical region: Secondary | ICD-10-CM

## 2013-02-18 ENCOUNTER — Other Ambulatory Visit (HOSPITAL_COMMUNITY): Payer: Self-pay | Admitting: Neurosurgery

## 2013-02-18 ENCOUNTER — Ambulatory Visit (HOSPITAL_COMMUNITY)
Admission: RE | Admit: 2013-02-18 | Discharge: 2013-02-18 | Disposition: A | Discharge status: Home / Self Care | Payer: BC Managed Care – PPO | Source: Ambulatory Visit | Attending: Neurosurgery | Admitting: Neurosurgery

## 2013-02-18 ENCOUNTER — Encounter (HOSPITAL_COMMUNITY): Payer: Self-pay

## 2013-02-18 DIAGNOSIS — M5412 Radiculopathy, cervical region: Secondary | ICD-10-CM

## 2013-02-18 DIAGNOSIS — M542 Cervicalgia: Secondary | ICD-10-CM | POA: Insufficient documentation

## 2013-02-18 DIAGNOSIS — Q762 Congenital spondylolisthesis: Secondary | ICD-10-CM | POA: Insufficient documentation

## 2013-02-18 DIAGNOSIS — M79609 Pain in unspecified limb: Secondary | ICD-10-CM | POA: Insufficient documentation

## 2013-02-18 DIAGNOSIS — M5030 Other cervical disc degeneration, unspecified cervical region: Secondary | ICD-10-CM | POA: Insufficient documentation

## 2013-02-18 DIAGNOSIS — R209 Unspecified disturbances of skin sensation: Secondary | ICD-10-CM | POA: Insufficient documentation

## 2013-02-18 DIAGNOSIS — M25519 Pain in unspecified shoulder: Secondary | ICD-10-CM | POA: Insufficient documentation

## 2013-04-01 ENCOUNTER — Ambulatory Visit: Payer: BC Managed Care – PPO | Admitting: Family Medicine

## 2013-04-17 ENCOUNTER — Ambulatory Visit (INDEPENDENT_AMBULATORY_CARE_PROVIDER_SITE_OTHER): Payer: BC Managed Care – PPO | Admitting: Nurse Practitioner

## 2013-04-17 VITALS — BP 118/78 | HR 120 | Ht 65.5 in | Wt 162.0 lb

## 2013-04-17 DIAGNOSIS — Z0000 Encounter for general adult medical examination without abnormal findings: Secondary | ICD-10-CM

## 2013-04-17 DIAGNOSIS — Z01419 Encounter for gynecological examination (general) (routine) without abnormal findings: Secondary | ICD-10-CM

## 2013-04-17 DIAGNOSIS — Z124 Encounter for screening for malignant neoplasm of cervix: Secondary | ICD-10-CM

## 2013-04-18 LAB — PAP IG W/ RFLX HPV ASCU

## 2013-04-23 ENCOUNTER — Encounter: Payer: Self-pay | Admitting: Nurse Practitioner

## 2013-04-23 NOTE — Progress Notes (Signed)
  Subjective:    Patient ID: Christina Blake, female    DOB: 28-Nov-1970, 42 y.o.   MRN: [id number]  HPI presents for wellness exam. Has had endometrial ablation. Continues to have regular cycles, normal flow lasting one day. Same sexual partner. No vaginal discharge. No pelvic pain. Regular dental and vision exams. Has had her flu vaccine at work. Would like to have her mammogram and labs done at [facility]. Continues followup with mental health.    Review of Systems  Constitutional: Negative for activity change, appetite change and fatigue.  HENT: Negative for dental problem, ear pain, postnasal drip, rhinorrhea and sore throat.   Eyes: Negative for visual disturbance.  Respiratory: Negative for cough, chest tightness, shortness of breath and wheezing.   Cardiovascular: Negative for chest pain and leg swelling.  Gastrointestinal: Negative for nausea, vomiting, abdominal pain, diarrhea, constipation and blood in stool.  Genitourinary: Negative for dysuria, urgency, frequency, vaginal discharge, enuresis, difficulty urinating, menstrual problem and pelvic pain.  Psychiatric/Behavioral: Negative for sleep disturbance.       Objective:   Physical Exam  Vitals reviewed. Constitutional: She is oriented to person, place, and time. She appears well-developed. No distress.  HENT:  Right Ear: External ear normal.  Left Ear: External ear normal.  Mouth/Throat: Oropharynx is clear and moist.  Neck: Normal range of motion. Neck supple. No tracheal deviation present. No thyromegaly present.  Cardiovascular: Normal rate, regular rhythm and normal heart sounds.  Exam reveals no gallop.   No murmur heard. Pulmonary/Chest: Effort normal and breath sounds normal.  Abdominal: Soft. She exhibits no distension. There is no tenderness.  Genitourinary: Vagina normal and uterus normal. No vaginal discharge found.  Musculoskeletal: She exhibits no edema.  Lymphadenopathy:    She has no cervical  adenopathy.  Neurological: She is alert and oriented to person, place, and time.  Skin: Skin is warm and dry. No rash noted.  Psychiatric: She has a normal mood and affect. Her behavior is normal.   Breast exam: No masses noted, axilla no adenopathy. External GU normal. Vagina no discharge noted. No CMT. Uterus and adnexa normal, nontender. Significant generalized sun damage noted.        Assessment & Plan:  Well woman exam  Screening for cervical cancer - Plan: Pap IG w/ reflex to HPV when ASC-U  Continue followup with mental health. Recommended patient consider skin cancer screening for dermatology. Given prescriptions for mammogram due in December and for routine lab work to be done through [facility]. To send a copy to our office. Daily vitamin D and calcium supplementation. Recommend exercise and healthy diet. Next physical in one year.

## 2013-04-26 ENCOUNTER — Emergency Department (HOSPITAL_COMMUNITY)
Admission: EM | Admit: 2013-04-26 | Discharge: 2013-04-26 | Disposition: A | Discharge status: Home / Self Care | Payer: BC Managed Care – PPO | Attending: Emergency Medicine | Admitting: Emergency Medicine

## 2013-04-26 ENCOUNTER — Emergency Department (HOSPITAL_COMMUNITY): Payer: BC Managed Care – PPO

## 2013-04-26 ENCOUNTER — Encounter (HOSPITAL_COMMUNITY): Payer: Self-pay | Admitting: Emergency Medicine

## 2013-04-26 DIAGNOSIS — Z86711 Personal history of pulmonary embolism: Secondary | ICD-10-CM | POA: Insufficient documentation

## 2013-04-26 DIAGNOSIS — Y9389 Activity, other specified: Secondary | ICD-10-CM | POA: Insufficient documentation

## 2013-04-26 DIAGNOSIS — M545 Low back pain, unspecified: Secondary | ICD-10-CM | POA: Insufficient documentation

## 2013-04-26 DIAGNOSIS — Y92410 Unspecified street and highway as the place of occurrence of the external cause: Secondary | ICD-10-CM | POA: Insufficient documentation

## 2013-04-26 DIAGNOSIS — E7800 Pure hypercholesterolemia, unspecified: Secondary | ICD-10-CM | POA: Insufficient documentation

## 2013-04-26 DIAGNOSIS — M546 Pain in thoracic spine: Secondary | ICD-10-CM | POA: Insufficient documentation

## 2013-04-26 DIAGNOSIS — Z79899 Other long term (current) drug therapy: Secondary | ICD-10-CM | POA: Insufficient documentation

## 2013-04-26 DIAGNOSIS — F17200 Nicotine dependence, unspecified, uncomplicated: Secondary | ICD-10-CM | POA: Insufficient documentation

## 2013-04-26 DIAGNOSIS — F329 Major depressive disorder, single episode, unspecified: Secondary | ICD-10-CM | POA: Insufficient documentation

## 2013-04-26 DIAGNOSIS — F411 Generalized anxiety disorder: Secondary | ICD-10-CM | POA: Insufficient documentation

## 2013-04-26 DIAGNOSIS — F3289 Other specified depressive episodes: Secondary | ICD-10-CM | POA: Insufficient documentation

## 2013-04-26 DIAGNOSIS — F4000 Agoraphobia, unspecified: Secondary | ICD-10-CM | POA: Insufficient documentation

## 2013-04-26 DIAGNOSIS — M542 Cervicalgia: Secondary | ICD-10-CM | POA: Insufficient documentation

## 2013-04-26 MED ORDER — OXYCODONE-ACETAMINOPHEN 5-325 MG PO TABS: 1.0000 | ORAL_TABLET | Freq: Four times a day (QID) | ORAL | Status: DC | PRN

## 2013-04-26 MED ORDER — METHOCARBAMOL 500 MG PO TABS: 500.0000 mg | ORAL_TABLET | Freq: Two times a day (BID) | ORAL | Status: DC

## 2013-04-26 NOTE — ED Notes (Signed)
Radiation to right arm down to thumb and pointer finger. Sensation present, and can wiggle digits.
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
## 2013-04-26 NOTE — ED Notes (Signed)
MD at bedside. 
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
## 2013-05-03 NOTE — ED Provider Notes (Signed)
Medical screening examination/treatment/procedure(s) were performed by non-physician practitioner and as supervising physician I was immediately available for consultation/collaboration.  EKG Interpretation   None         Mayte Diers J Tyrek Lawhorn, MD 05/03/13 1051 

## 2013-05-08 ENCOUNTER — Ambulatory Visit (INDEPENDENT_AMBULATORY_CARE_PROVIDER_SITE_OTHER): Payer: BC Managed Care – PPO | Admitting: Family Medicine

## 2013-05-08 ENCOUNTER — Encounter: Payer: Self-pay | Admitting: Family Medicine

## 2013-05-08 VITALS — BP 126/82 | Ht 67.0 in | Wt 163.4 lb

## 2013-05-08 DIAGNOSIS — J329 Chronic sinusitis, unspecified: Secondary | ICD-10-CM

## 2013-05-08 MED ORDER — AMOXICILLIN-POT CLAVULANATE 875-125 MG PO TABS: 1.0000 | ORAL_TABLET | Freq: Two times a day (BID) | ORAL | Status: AC
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
## 2013-06-07 ENCOUNTER — Telehealth: Payer: Self-pay | Admitting: *Deleted

## 2013-06-07 NOTE — Telephone Encounter (Signed)
Patient needs to do bloodwork for coagulation studies per Dr. Brett Canales.

## 2013-06-09 NOTE — Telephone Encounter (Signed)
Spoke with patient and told her to pick up her BW papers. Pt verbalized understanding.

## 2013-06-09 NOTE — Telephone Encounter (Signed)
Tilt table test is one of the tests for vertigo. BUT first find out has she seen an ENT doctor to rule out other causes. If she has then we need to find a specialist to do the test.

## 2013-06-12 NOTE — Telephone Encounter (Signed)
Patient stated that she will call ENT and schedule this testing herself.

## 2013-06-12 NOTE — Telephone Encounter (Signed)
Refer to ENT of choice for evaluation of vertigo and possible tilt table testing. (that is done through specialist; we do not order directly)

## 2013-08-11 ENCOUNTER — Encounter: Payer: Self-pay | Admitting: Family Medicine

## 2013-08-27 ENCOUNTER — Other Ambulatory Visit: Payer: Self-pay | Admitting: *Deleted

## 2013-08-27 DIAGNOSIS — R7982 Elevated C-reactive protein (CRP): Secondary | ICD-10-CM

## 2013-09-03 ENCOUNTER — Encounter: Payer: Self-pay | Admitting: Family Medicine

## 2013-09-03 ENCOUNTER — Ambulatory Visit (INDEPENDENT_AMBULATORY_CARE_PROVIDER_SITE_OTHER): Payer: BC Managed Care – PPO | Admitting: Family Medicine

## 2013-09-03 VITALS — BP 100/70 | Temp 99.0°F | Ht 67.0 in | Wt 159.2 lb

## 2013-09-03 DIAGNOSIS — J09X2 Influenza due to identified novel influenza A virus with other respiratory manifestations: Secondary | ICD-10-CM

## 2013-09-03 MED ORDER — OSELTAMIVIR PHOSPHATE 75 MG PO CAPS: 75.0000 mg | ORAL_CAPSULE | Freq: Two times a day (BID) | ORAL | Status: DC

## 2013-09-03 NOTE — Progress Notes (Signed)
   Subjective:    Patient ID: Christina Blake, female    DOB: 10-10-1970, 43 y.o.   MRN: [id number]  Fever  This is a new problem. The current episode started yesterday. The problem occurs constantly. The problem has been unchanged. Her temperature was unmeasured prior to arrival. Associated symptoms include congestion, coughing, headaches and muscle aches. She has tried nothing for the symptoms. The treatment provided no relief.    Fever ofr and on  Energy level zero  Headache a lot  Vice achey all over  Flu vaccine already  No flu like symptoms  Pt has daughter with bronchitis  Felt no energy for a couple weeks    Review of Systems  Constitutional: Positive for fever.  HENT: Positive for congestion.   Respiratory: Positive for cough.   Neurological: Positive for headaches.   no vomiting no diarrhea no rash ROS otherwise than     Objective:   Physical Exam  Alert moderate malaise. Temp 99 vitals reviewed. Lungs clear. Heart regular in rhythm. H&T normal. Intermittent cough.      Assessment & Plan:  Impression influenza discussed plan Tamiflu twice a day 5 days. Hycodan when necessary for cough or headache. Symptomatic care discussed. WSL

## 2013-09-18 ENCOUNTER — Encounter (HOSPITAL_COMMUNITY): Payer: BC Managed Care – PPO

## 2013-09-18 ENCOUNTER — Telehealth: Payer: Self-pay | Admitting: Family Medicine

## 2013-09-18 ENCOUNTER — Encounter (HOSPITAL_COMMUNITY): Payer: BC Managed Care – PPO | Attending: Hematology and Oncology

## 2013-09-18 ENCOUNTER — Encounter (HOSPITAL_COMMUNITY): Payer: Self-pay

## 2013-09-18 VITALS — BP 110/72 | HR 83 | Temp 98.2°F | Resp 16 | Ht 67.25 in | Wt 167.6 lb

## 2013-09-18 DIAGNOSIS — Z86718 Personal history of other venous thrombosis and embolism: Secondary | ICD-10-CM

## 2013-09-18 DIAGNOSIS — R7982 Elevated C-reactive protein (CRP): Secondary | ICD-10-CM

## 2013-09-18 DIAGNOSIS — Z87898 Personal history of other specified conditions: Secondary | ICD-10-CM

## 2013-09-18 DIAGNOSIS — F329 Major depressive disorder, single episode, unspecified: Secondary | ICD-10-CM | POA: Insufficient documentation

## 2013-09-18 DIAGNOSIS — R5381 Other malaise: Secondary | ICD-10-CM | POA: Insufficient documentation

## 2013-09-18 DIAGNOSIS — G4733 Obstructive sleep apnea (adult) (pediatric): Secondary | ICD-10-CM | POA: Insufficient documentation

## 2013-09-18 DIAGNOSIS — R5383 Other fatigue: Secondary | ICD-10-CM

## 2013-09-18 DIAGNOSIS — Z86711 Personal history of pulmonary embolism: Secondary | ICD-10-CM

## 2013-09-18 DIAGNOSIS — R634 Abnormal weight loss: Secondary | ICD-10-CM | POA: Insufficient documentation

## 2013-09-18 DIAGNOSIS — F3289 Other specified depressive episodes: Secondary | ICD-10-CM | POA: Insufficient documentation

## 2013-09-18 LAB — CBC WITH DIFFERENTIAL/PLATELET
Basophils Absolute: 0 10*3/uL (ref 0.0–0.1)
Basophils Relative: 0 % (ref 0–1)
EOS ABS: 0.2 10*3/uL (ref 0.0–0.7)
Eosinophils Relative: 2 % (ref 0–5)
HCT: 40.3 % (ref 36.0–46.0)
HEMOGLOBIN: 13.5 g/dL (ref 12.0–15.0)
LYMPHS ABS: 1.5 10*3/uL (ref 0.7–4.0)
Lymphocytes Relative: 19 % (ref 12–46)
MCH: 30.8 pg (ref 26.0–34.0)
MCHC: 33.5 g/dL (ref 30.0–36.0)
MCV: 92 fL (ref 78.0–100.0)
MONOS PCT: 11 % (ref 3–12)
Monocytes Absolute: 0.8 10*3/uL (ref 0.1–1.0)
Neutro Abs: 5.3 10*3/uL (ref 1.7–7.7)
Neutrophils Relative %: 68 % (ref 43–77)
Platelets: 197 10*3/uL (ref 150–400)
RBC: 4.38 MIL/uL (ref 3.87–5.11)
RDW: 12.9 % (ref 11.5–15.5)
WBC: 7.9 10*3/uL (ref 4.0–10.5)

## 2013-09-18 LAB — COMPREHENSIVE METABOLIC PANEL
ALK PHOS: 93 U/L (ref 39–117)
ALT: 24 U/L (ref 0–35)
AST: 21 U/L (ref 0–37)
Albumin: 3.9 g/dL (ref 3.5–5.2)
BUN: 21 mg/dL (ref 6–23)
CHLORIDE: 102 meq/L (ref 96–112)
CO2: 25 meq/L (ref 19–32)
Calcium: 9.4 mg/dL (ref 8.4–10.5)
Creatinine, Ser: 0.83 mg/dL (ref 0.50–1.10)
GFR, EST NON AFRICAN AMERICAN: 85 mL/min — AB (ref >90)
Glucose, Bld: 89 mg/dL (ref 70–99)
POTASSIUM: 4 meq/L (ref 3.7–5.3)
Sodium: 138 mEq/L (ref 137–147)
Total Protein: 7.5 g/dL (ref 6.0–8.3)

## 2013-09-18 LAB — RETICULOCYTES
RBC.: 4.38 MIL/uL (ref 3.87–5.11)
Retic Count, Absolute: 43.8 10*3/uL (ref 19.0–186.0)
Retic Ct Pct: 1 % (ref 0.4–3.1)

## 2013-09-18 LAB — C-REACTIVE PROTEIN

## 2013-09-18 LAB — LACTATE DEHYDROGENASE: LDH: 192 U/L (ref 94–250)

## 2013-09-18 LAB — SEDIMENTATION RATE: Sed Rate: 5 mm/hr (ref 0–22)

## 2013-09-18 LAB — RHEUMATOID FACTOR: Rhuematoid fact SerPl-aCnc: <10 IU/mL (ref <=14)

## 2013-09-18 LAB — D-DIMER, QUANTITATIVE: D-Dimer, Quant: 0.9 ug/mL-FEU — ABNORMAL HIGH (ref 0.00–0.48)

## 2013-09-18 NOTE — Progress Notes (Unsigned)
Christina Blake presented for labwork. Labs per MD order drawn via Peripheral Line 23 gauge needle inserted in left [facility] & right forearm (number of tubes drawn required 2 venipunctures)  Good blood return present. Procedure without incident.  Needle removed intact. Patient tolerated procedure well.

## 2013-09-18 NOTE — Patient Instructions (Signed)
Christina Blake Discharge Instructions  RECOMMENDATIONS MADE BY THE CONSULTANT AND ANY TEST RESULTS WILL BE SENT TO YOUR REFERRING PHYSICIAN.  EXAM FINDINGS BY THE PHYSICIAN TODAY AND SIGNS OR SYMPTOMS TO REPORT TO CLINIC OR PRIMARY PHYSICIAN: Exam and findings as discussed by Dr. Barnet Glasgow.  Will check some labs today and will discuss results on return in 2 weeks.  If there is anything that requires immediate attention, we will contact you.    INSTRUCTIONS/FOLLOW-UP: Follow-up in 2 weeks.  Thank you for choosing [facility] to provide your oncology and hematology care.  To afford each patient quality time with our providers, please arrive at least 15 minutes before your scheduled appointment time.  With your help, our goal is to use those 15 minutes to complete the necessary work-up to ensure our physicians have the information they need to help with your evaluation and healthcare recommendations.    Effective January 1st, 2014, we ask that you re-schedule your appointment with our physicians should you arrive 10 or more minutes late for your appointment.  We strive to give you quality time with our providers, and arriving late affects you and other patients whose appointments are after yours.    Again, thank you for choosing [facility].  Our hope is that these requests will decrease the amount of time that you wait before being seen by our physicians.       _____________________________________________________________  Should you have questions after your visit to [facility], please contact our office at [phone] between the hours of 8:30 a.m. and 5:00 p.m.  Voicemails left after 4:30 p.m. will not be returned until the following business day.  For prescription refill requests, have your pharmacy contact our office with your prescription refill request.

## 2013-09-18 NOTE — Telephone Encounter (Signed)
You referred pt to [facility] for "elevated C reactive protein" however there's no lab results to support it.  Is there another reason we referred pt?  Their office wants more lab results, we don't have any and I faxed everything we received from [facility].  Please advise.  Pt's appointment is today @ 2:30

## 2013-09-18 NOTE — Progress Notes (Signed)
[place] A. Barnet Glasgow, M.D.  NEW PATIENT EVALUATION   Name: Christina Blake Date: 09/18/2013 MRN: [id number] DOB: December 14, 1970  PCP: Rubbie Battiest, MD   REFERRING PHYSICIAN: Mikey Kirschner, MD  REASON FOR REFERRAL: History of pulmonary embolism and deep venous thrombosis with elevated CRP.     HISTORY OF PRESENT ILLNESS:Christina Blake is a 43 y.o. female who is referred by her family physician for evaluation of possible thrombophilia with elevation in C-reactive protein. In 2012 the patient took a trip to [place] and on upon arrival develop shortness of breath. She was seen in the nursery center and given IV fluids. She remained there in about 13 days later ultimately was diagnosed with bilateral pulmonary emboli. She was treated with anticoagulants for 12 months and recently underwent a hypercoagulability workup all of which was normal. She has lost about 30-40,000 the past 2 years and has had "soaking sweats" for the last 2 years. Her 59 year old daughter became pregnant and she is currently taking care of that individual plus a 62-monthold child. 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 She does snore. She feels very fatigued despite getting the "good night sleep." Last menstrual period w I. a as 3 weeks ago lasting 3 days and occurring every 28 days. She also suffers for drenching night sweats.  PAST MEDICAL HISTORY:  has a past medical history of Pulmonary embolism; Hypercholesteremia; Pulmonary embolism (01/04/2011); Anxiety; Depression; Family history of anesthesia complication; Orthostatic hypotension; and Agoraphobia (2007).     PAST SURGICAL HISTORY: Past Surgical History  Procedure Laterality Date  . Osteotomy      left foot 2nd toe-[facility]  . Ablation      uterine  . L4,l5 & s1 fusion  07/23/12  . C5 & c6  fusion  07/25/12     CURRENT MEDICATIONS: has a current medication list which includes the following prescription(s): alprazolam, black cohosh, calcium carbonate, lamotrigine, methocarbamol, oxycodone-acetaminophen, paroxetine, temazepam, trazodone, and oseltamivir.   ALLERGIES: Review of patient's allergies indicates no known allergies.   SOCIAL HISTORY:  reports that she has been smoking Cigarettes.  She has a 20 pack-year smoking history. She has never used smokeless tobacco. She reports that she does not drink alcohol or use illicit drugs.   FAMILY HISTORY: family history includes Breast cancer in her cousin and maternal aunt; Heart disease (age of onset: 513 in her mother; Hypertension in her mother; Osteoporosis in her mother. There is no history of Anesthesia problems, Hypotension, Malignant hyperthermia, or Pseudochol deficiency.    REVIEW OF SYSTEMS:  Other than that discussed above is noncontributory.    PHYSICAL EXAM:  height is 5' 7.25" (1.708 m) and weight is 167 lb 9.6 oz (76.023 kg). Her oral temperature is 98.2 F (36.8 C). Her blood pressure is 110/72 and her pulse is 83. Her respiration is 16.    GENERAL:alert, no distress and comfortable SKIN: skin color, texture, turgor are normal, no rashes or significant lesions EYES: normal, Conjunctiva are pink and non-injected, sclera clear OROPHARYNX:no exudate, no erythema and lips, buccal mucosa, and tongue normal  NECK: supple, thyroid normal size, non-tender, without nodularity CHEST: No evidence of breast masses. LYMPH:  no palpable lymphadenopathy in the cervical, axillary or inguinal LUNGS: clear to auscultation and percussion with normal breathing effort HEART: regular rate &  rhythm and no murmurs ABDOMEN:abdomen soft, non-tender and normal bowel sounds MUSCULOSKELETALl:no cyanosis of digits, no clubbing or edema left anterior neck and lower spine surgical wounds well healed. NEURO: alert & oriented x 3 with fluent  speech, no focal motor/sensory deficits    LABORATORY DATA:  No visits with results within 30 Day(s) from this visit. Latest known visit with results is:  Office Visit on 04/17/2013  Component Date Value Ref Range Status  . Specimen adequacy: 04/17/2013    Final   Comment: SATISFACTORY.  Endocervical/transformation zone component present.                          The specimen is partially obscured by inflammation.  Marland Kitchen FINAL DIAGNOSIS: 04/17/2013    Final   Comment: -                          NEGATIVE FOR INTRAEPITHELIAL LESIONS OR MALIGNANCY.  Marland Kitchen COMMENTS: 04/17/2013    Final   LMP (Last Menstrual Period) of the patient is not given.  . Cytotechnologist: 04/17/2013    Final   Comment: JRW, BS CT(ASCP)                          *                          The Pap smear is a screening test used to detect cervical cancer and                          its precursors.  It should not be used as the sole means to detect                          cervical cancer.  Test results should be correlated with clinical                          findings.  The Pap test is unreliable for detecting endometrial                          lesions and should not be used to evaluate endometrial abnormalities.                          Data indicate the Pap test is subject to false negative and false                          positive results.  Therefore, periodic repeat testing and follow-up of                          any unexplained clinical signs and symptoms are recommended.                                                     Dr. Yisroel Ramming, Cytology Medical Director    Urinalysis    Component Value Date/Time   COLORURINE YELLOW 01/04/2011 [place] 01/04/2011 1850  LABSPEC 1.010 01/04/2011 1850   PHURINE 6.0 01/04/2011 1850   GLUCOSEU NEGATIVE 01/04/2011 1850   HGBUR NEGATIVE 01/04/2011 1850   BILIRUBINUR NEGATIVE 01/04/2011 1850   KETONESUR NEGATIVE 01/04/2011 1850   PROTEINUR NEGATIVE 01/04/2011  1850   UROBILINOGEN 0.2 01/04/2011 1850   NITRITE NEGATIVE 01/04/2011 1850   LEUKOCYTESUR NEGATIVE 01/04/2011 1850      _0 : No results found.  PATHOLOGY: No pathology.   IMPRESSION:  #1. History of pulmonary embolism 3 years ago with a family history in her mother  having  post partum blood clots X 2. The patient herself is gravida 2 para 2 Ab0 with no history of miscarriages. #2. Weight loss probably secondary to social considerations. #3. Fatigue possibly due to obstructive sleep apnea versus thyroid dysfunction. #4. Depressive disorder, taking Lamictal, paroxetine, and trazodone.   PLAN:  #1. CBC, chem profile, LDH, C-reactive protein, ESR, ANA, TSH, beta 2 microglobulin. #2. Sleep study. #3. Patient was reassured. #4. Followup in 2 weeks unless abnormalities are found that would require additional intervention before her next visit.  I appreciate the opportunity of sharing in her care.   Doroteo Bradford, MD 09/18/2013 3:46 PM

## 2013-09-19 LAB — ANA: Anti Nuclear Antibody(ANA): NEGATIVE

## 2013-09-19 LAB — TSH: TSH: 1.151 u[IU]/mL (ref 0.350–4.500)

## 2013-09-22 LAB — BETA 2 MICROGLOBULIN, SERUM: BETA 2 MICROGLOBULIN: 2.51 mg/L — AB (ref <=2.51)

## 2013-09-24 ENCOUNTER — Ambulatory Visit (INDEPENDENT_AMBULATORY_CARE_PROVIDER_SITE_OTHER): Payer: BC Managed Care – PPO | Admitting: Nurse Practitioner

## 2013-09-24 ENCOUNTER — Encounter: Payer: Self-pay | Admitting: Nurse Practitioner

## 2013-09-24 VITALS — BP 100/70 | Temp 98.6°F | Ht 67.0 in | Wt 164.0 lb

## 2013-09-24 DIAGNOSIS — Z3049 Encounter for surveillance of other contraceptives: Secondary | ICD-10-CM

## 2013-09-24 DIAGNOSIS — J069 Acute upper respiratory infection, unspecified: Secondary | ICD-10-CM

## 2013-09-24 MED ORDER — METHYLPREDNISOLONE ACETATE 40 MG/ML IJ SUSP
40.0000 mg | Freq: Once | INTRAMUSCULAR | Status: AC
  Administered 2013-09-24: 40 mg via INTRAMUSCULAR

## 2013-09-24 MED ORDER — LEVOFLOXACIN 500 MG PO TABS: 500.0000 mg | ORAL_TABLET | Freq: Every day | ORAL | Status: DC

## 2013-09-24 NOTE — Progress Notes (Signed)
   Subjective:    Patient ID: Christina Blake, female    DOB: 12-18-1970, 43 y.o.   MRN: [id number]  Sinusitis This is a recurrent problem. Episode onset: Friday. There has been no fever. Associated symptoms include congestion, coughing, headaches, sinus pressure and a sore throat. Past treatments include oral decongestants. The treatment provided mild relief.   presents complaints of congestion over the past week. Has been treated in urgent care twice over the past several weeks, has had 2 prescriptions for Augmentin, symptoms came back as soon as antibiotic with complete. Cough worse at night. Producing gray to brown sputum. Patient is a former smoker. Facial area headache. No ear pain.    Review of Systems  HENT: Positive for congestion, sinus pressure and sore throat.   Respiratory: Positive for cough.   Neurological: Positive for headaches.       Objective:   Physical Exam NAD. Alert, oriented. TMs clear effusion, no erythema. More so on the left side. Pharynx erythematous with PND noted. Neck supple with mild soft anterior adenopathy. Lungs clear. Heart regular rate rhythm.       Assessment & Plan:  Acute upper respiratory infections of unspecified site - Plan: methylPREDNISolone acetate (DEPO-MEDROL) injection 40 mg  Meds ordered this encounter  Medications  . levofloxacin (LEVAQUIN) 500 MG tablet    Sig: Take 1 tablet (500 mg total) by mouth daily.    Dispense:  14 tablet    Refill:  0    Order Specific Question:  Supervising Provider    Answer:  Mikey Kirschner [[id number]]  . methylPREDNISolone acetate (DEPO-MEDROL) injection 40 mg    Sig:    OTC meds as directed for congestion.  Call back next week if no improvement, sooner if worse.

## 2013-09-28 ENCOUNTER — Encounter: Payer: Self-pay | Admitting: Nurse Practitioner

## 2013-10-02 ENCOUNTER — Encounter (HOSPITAL_COMMUNITY): Payer: BC Managed Care – PPO | Attending: Hematology and Oncology

## 2013-10-02 ENCOUNTER — Encounter (HOSPITAL_COMMUNITY): Payer: Self-pay

## 2013-10-02 VITALS — BP 110/60 | HR 81 | Temp 97.5°F | Resp 20 | Wt 163.9 lb

## 2013-10-02 DIAGNOSIS — Z87898 Personal history of other specified conditions: Secondary | ICD-10-CM

## 2013-10-02 DIAGNOSIS — R5383 Other fatigue: Secondary | ICD-10-CM

## 2013-10-02 DIAGNOSIS — F329 Major depressive disorder, single episode, unspecified: Secondary | ICD-10-CM | POA: Insufficient documentation

## 2013-10-02 DIAGNOSIS — Z86711 Personal history of pulmonary embolism: Secondary | ICD-10-CM | POA: Insufficient documentation

## 2013-10-02 DIAGNOSIS — R7982 Elevated C-reactive protein (CRP): Secondary | ICD-10-CM

## 2013-10-02 DIAGNOSIS — F3289 Other specified depressive episodes: Secondary | ICD-10-CM | POA: Insufficient documentation

## 2013-10-02 DIAGNOSIS — R634 Abnormal weight loss: Secondary | ICD-10-CM | POA: Insufficient documentation

## 2013-10-02 DIAGNOSIS — G4733 Obstructive sleep apnea (adult) (pediatric): Secondary | ICD-10-CM | POA: Insufficient documentation

## 2013-10-02 DIAGNOSIS — Z86718 Personal history of other venous thrombosis and embolism: Secondary | ICD-10-CM

## 2013-10-02 DIAGNOSIS — Z9189 Other specified personal risk factors, not elsewhere classified: Secondary | ICD-10-CM

## 2013-10-02 DIAGNOSIS — R5381 Other malaise: Secondary | ICD-10-CM | POA: Insufficient documentation

## 2013-10-02 NOTE — Patient Instructions (Signed)
[facility] Discharge Instructions  RECOMMENDATIONS MADE BY THE CONSULTANT AND ANY TEST RESULTS WILL BE SENT TO YOUR REFERRING PHYSICIAN.  No follow up required from this clinic.  Please call for any questions or concerns.  Thank you for choosing [facility] to provide your oncology and hematology care.  To afford each patient quality time with our providers, please arrive at least 15 minutes before your scheduled appointment time.  With your help, our goal is to use those 15 minutes to complete the necessary work-up to ensure our physicians have the information they need to help with your evaluation and healthcare recommendations.    Effective January 1st, 2014, we ask that you re-schedule your appointment with our physicians should you arrive 10 or more minutes late for your appointment.  We strive to give you quality time with our providers, and arriving late affects you and other patients whose appointments are after yours.    Again, thank you for choosing [facility].  Our hope is that these requests will decrease the amount of time that you wait before being seen by our physicians.       _____________________________________________________________  Should you have questions after your visit to [facility], please contact our office at [phone] between the hours of 8:30 a.m. and 5:00 p.m.  Voicemails left after 4:30 p.m. will not be returned until the following business day.  For prescription refill requests, have your pharmacy contact our office with your prescription refill request.

## 2013-10-02 NOTE — Progress Notes (Signed)
[facility]  OFFICE PROGRESS NOTE  Rubbie Battiest, MD [address] [place] [address]  DIAGNOSIS: No diagnosis found.  Chief Complaint  Patient presents with  . Possible thrombophilia    CURRENT THERAPY: Undergoing workup for weight loss and night sweats.  INTERVAL HISTORY: Christina Blake 43 y.o. female returns for followup of possible thrombophilia with elevation and C-reactive protein.  She denies intact pain but recently episode of sinusitis which she is taking Levaquin. Symptoms have improved. She denies a cough, wheezing, but still gets ranging night sweats with normal bowel movements and no lower extremity swelling or redness. She denies any chest pain, PND, orthopnea, palpitations, has had a minimal headache which has improved but without focal weakness.   MEDICAL HISTORY: Past Medical History  Diagnosis Date  . Pulmonary embolism   . Hypercholesteremia   . Pulmonary embolism 01/04/2011    bilaterally  . Anxiety   . Depression   . Family history of anesthesia complication     PONV  . Orthostatic hypotension   . Agoraphobia 2007    08/2013 -patient states no longer has this    INTERIM HISTORY: has Depression and Panic disorder with agoraphobia on her problem list.   In 2012 the patient took a trip to [place] and on upon arrival develop shortness of breath. She was seen in the nursery center and given IV fluids. She remained there in about 13 days later ultimately was diagnosed with bilateral pulmonary emboli. She was treated with anticoagulants for 12 months and recently underwent a hypercoagulability workup all of which was normal.  She has lost about 30-40 pounds over the past 2 years and has had "soaking sweats" for the last 2 years. Her 29 year old daughter became pregnant and she is currently taking care of that individual plus a 28-monthold child. 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 She does snore. She feels very fatigued despite getting the "good night sleep." Last menstrual period w I. a as 3 weeks ago lasting 3 days and occurring every 28 days. She also suffers for drenching night sweats  ALLERGIES:  has No Known Allergies.  MEDICATIONS: has a current medication list which includes the following prescription(s): alprazolam, black cohosh, calcium carbonate, lamotrigine, levofloxacin, methocarbamol, oxycodone-acetaminophen, paroxetine, temazepam, and trazodone.  SURGICAL HISTORY:  Past Surgical History  Procedure Laterality Date  . Osteotomy      left foot 2nd toe-[facility]  . Ablation      uterine  . L4,l5 & s1 fusion  07/23/12  . C5 & c6 fusion  07/25/12    FAMILY HISTORY: family history includes Breast cancer in her cousin and maternal aunt; Heart disease (age of onset: 573 in her mother; Hypertension in her mother; Osteoporosis in her mother. There is no history of Anesthesia problems, Hypotension, Malignant hyperthermia, or Pseudochol deficiency.  SOCIAL HISTORY:  reports that she quit smoking about 2 weeks ago. Her smoking use included Cigarettes. She has a 20 pack-year smoking history. She has never used smokeless tobacco. She reports that she does not drink alcohol or use illicit drugs.  REVIEW OF SYSTEMS:  Other than that discussed above is noncontributory.  PHYSICAL EXAMINATION: ECOG PERFORMANCE STATUS: 1 - Symptomatic but completely ambulatory  There were no vitals taken for this visit.  GENERAL:alert, no distress and  comfortable SKIN: skin color, texture, turgor are normal, no rashes or significant lesions EYES: PERLA; Conjunctiva are pink and non-injected, sclera clear SINUSES: No redness or tenderness over maxillary or ethmoid sinuses OROPHARYNX:no exudate, no erythema on lips, buccal mucosa, or tongue. NECK: supple, thyroid normal size, non-tender,  without nodularity. No masses. Posterior surgical scar is well-healed. CHEST: Normal AP diameter with no breast masses. LYMPH:  no palpable lymphadenopathy in the cervical, axillary or inguinal LUNGS: clear to auscultation and percussion with normal breathing effort HEART: regular rate & rhythm and no murmurs. ABDOMEN:abdomen soft, non-tender and normal bowel sounds. No hepatosplenomegaly. MUSCULOSKELETAL:no cyanosis of digits and no clubbing. Range of motion normal.  NEURO: alert & oriented x 3 with fluent speech, no focal motor/sensory deficits   LABORATORY DATA: Office Visit on 09/18/2013  Component Date Value Ref Range Status  . WBC 09/18/2013 7.9  4.0 - 10.5 K/uL Final  . RBC 09/18/2013 4.38  3.87 - 5.11 MIL/uL Final  . Hemoglobin 09/18/2013 13.5  12.0 - 15.0 g/dL Final  . HCT 09/18/2013 40.3  36.0 - 46.0 % Final  . MCV 09/18/2013 92.0  78.0 - 100.0 fL Final  . MCH 09/18/2013 30.8  26.0 - 34.0 pg Final  . MCHC 09/18/2013 33.5  30.0 - 36.0 g/dL Final  . RDW 09/18/2013 12.9  11.5 - 15.5 % Final  . Platelets 09/18/2013 197  150 - 400 K/uL Final  . Neutrophils Relative % 09/18/2013 68  43 - 77 % Final  . Neutro Abs 09/18/2013 5.3  1.7 - 7.7 K/uL Final  . Lymphocytes Relative 09/18/2013 19  12 - 46 % Final  . Lymphs Abs 09/18/2013 1.5  0.7 - 4.0 K/uL Final  . Monocytes Relative 09/18/2013 11  3 - 12 % Final  . Monocytes Absolute 09/18/2013 0.8  0.1 - 1.0 K/uL Final  . Eosinophils Relative 09/18/2013 2  0 - 5 % Final  . Eosinophils Absolute 09/18/2013 0.2  0.0 - 0.7 K/uL Final  . Basophils Relative 09/18/2013 0  0 - 1 % Final  . Basophils Absolute 09/18/2013 0.0  0.0 - 0.1 K/uL Final  . Retic Ct Pct 09/18/2013 1.0  0.4 - 3.1 % Final  . RBC. 09/18/2013 4.38  3.87 - 5.11 MIL/uL Final  . Retic Count, Manual 09/18/2013 43.8  19.0 - 186.0 K/uL Final  . Sodium 09/18/2013 138  137 - 147 mEq/L Final  . Potassium 09/18/2013 4.0  3.7 - 5.3 mEq/L Final  . Chloride 09/18/2013 102  96 - 112  mEq/L Final  . CO2 09/18/2013 25  19 - 32 mEq/L Final  . Glucose, Bld 09/18/2013 89  70 - 99 mg/dL Final  . BUN 09/18/2013 21  6 - 23 mg/dL Final  . Creatinine, Ser 09/18/2013 0.83  0.50 - 1.10 mg/dL Final  . Calcium 09/18/2013 9.4  8.4 - 10.5 mg/dL Final  . Total Protein 09/18/2013 7.5  6.0 - 8.3 g/dL Final  . Albumin 09/18/2013 3.9  3.5 - 5.2 g/dL Final  . AST 09/18/2013 21  0 - 37 U/L Final  . ALT 09/18/2013 24  0 - 35 U/L Final  . Alkaline Phosphatase 09/18/2013 93  39 - 117 U/L Final  . Total Bilirubin 09/18/2013 <0.2* 0.3 - 1.2 mg/dL Final  . GFR calc non Af Amer 09/18/2013 85* >90 mL/min Final  . GFR calc Af Amer 09/18/2013 >90  >90 mL/min Final   Comment: (NOTE)  The eGFR has been calculated using the CKD EPI equation.                          This calculation has not been validated in all clinical situations.                          eGFR's persistently <90 mL/min signify possible Chronic Kidney                          Disease.  Marland Kitchen LDH 09/18/2013 192  94 - 250 U/L Final  . Sed Rate 09/18/2013 5  0 - 22 mm/hr Final  . ANA 09/18/2013 NEGATIVE  NEGATIVE Final   Performed at [facility]  . Rheumatoid Factor 09/18/2013 <10  <=14 IU/mL Corrected   Comment: (NOTE)                                                  Interpretive Table                                             Low Positive: 15 - 41 IU/mL                                             High Positive:  >= 42 IU/mL                          In addition to the RF result, and clinical symptoms including joint                          involvement, the 2010 ACR Classification Criteria for                          scoring/diagnosing Rheumatoid Arthritis include the results of the                          following tests:  CRP (61607), ESR (15010), and CCP (APCA) (37106).                          www.rheumatology.org/practice/clinical/classification/ra/ra_2010.asp                          Performed  at [facility] 03/19 AT 2259: PREVIOUSLY REPORTED AS <10 Reference range: <=14  . CRP 09/18/2013 <0.5* <0.60 mg/dL Final   Performed at [facility]  . D-Dimer, Quant 09/18/2013 0.90* 0.00 - 0.48 ug/mL-FEU Final   Comment:                                 AT  THE INHOUSE ESTABLISHED CUTOFF                          VALUE OF 0.48 ug/mL FEU,                          THIS ASSAY HAS BEEN DOCUMENTED                          IN THE LITERATURE TO HAVE                          A SENSITIVITY AND NEGATIVE                          PREDICTIVE VALUE OF AT LEAST                          98 TO 99%.  THE TEST RESULT                          SHOULD BE CORRELATED WITH                          AN ASSESSMENT OF THE CLINICAL                          PROBABILITY OF DVT / VTE.  . TSH 09/18/2013 1.151  0.350 - 4.500 uIU/mL Final   Performed at [facility]  . Beta-2 Microglobulin 09/18/2013 2.51* <=2.51 mg/L Final   Performed at [facility]: Peripheral smear is normal.  Urinalysis    Component Value Date/Time   COLORURINE YELLOW 01/04/2011 [place] 01/04/2011 1850   LABSPEC 1.010 01/04/2011 1850   PHURINE 6.0 01/04/2011 1850   GLUCOSEU NEGATIVE 01/04/2011 1850   HGBUR NEGATIVE 01/04/2011 1850   BILIRUBINUR NEGATIVE 01/04/2011 1850   KETONESUR NEGATIVE 01/04/2011 1850   PROTEINUR NEGATIVE 01/04/2011 1850   UROBILINOGEN 0.2 01/04/2011 1850   NITRITE NEGATIVE 01/04/2011 1850   LEUKOCYTESUR NEGATIVE 01/04/2011 1850    RADIOGRAPHIC STUDIES: No results found.  ASSESSMENT:  #1. Weight loss secondary to current social situation with night sweats to 2 estrogen deprivation. No evidence of lymphoproliferative or hematologic disorder.   PLAN:  #1. The patient was reassured. #2. She was offered CT scans of chest abdomen and pelvis in an effort to look for occult lymphoproliferative disorder which would be highly unlikely in view  of a normal blood count, LDH, beta-2 microglobulin, and CRP. Patient is content with no scan. #3. No further appointments were made in this clinic but she was told to call should any new symptoms occur that are troublesome and persistent after which she would seek counsel.   All questions were answered. The patient knows to call the clinic with any problems, questions or concerns. We can certainly see the patient much sooner if necessary.   I spent 25 minutes counseling the patient face to face. The total time spent in the appointment was 30 minutes.    Doroteo Bradford, MD 10/02/2013 2:33 PM

## 2013-10-06 ENCOUNTER — Encounter: Payer: Self-pay | Admitting: Family Medicine

## 2014-01-26 ENCOUNTER — Telehealth: Payer: Self-pay | Admitting: Family Medicine

## 2014-01-26 MED ORDER — VALACYCLOVIR HCL 1 G PO TABS: ORAL_TABLET | ORAL | Status: DC

## 2014-01-26 NOTE — Telephone Encounter (Signed)
Valtrex one g numb four two now, two 12 hrs later

## 2014-01-26 NOTE — Addendum Note (Signed)
Addended byCharolotte Capuchin D on: 01/26/2014 11:48 AM   Modules accepted: Orders

## 2014-01-26 NOTE — Telephone Encounter (Signed)
Patient notified and verbalized understanding. 

## 2014-01-26 NOTE — Telephone Encounter (Signed)
Patient wants prescription called in for fever blister and states its spread to her face. [place]

## 2014-04-23 ENCOUNTER — Encounter: Payer: Self-pay | Admitting: Family Medicine

## 2014-04-23 ENCOUNTER — Ambulatory Visit (INDEPENDENT_AMBULATORY_CARE_PROVIDER_SITE_OTHER): Payer: BC Managed Care – PPO | Admitting: Family Medicine

## 2014-04-23 VITALS — BP 118/78 | Temp 98.2°F | Ht 67.0 in | Wt 170.0 lb

## 2014-04-23 DIAGNOSIS — J329 Chronic sinusitis, unspecified: Secondary | ICD-10-CM

## 2014-04-23 DIAGNOSIS — M546 Pain in thoracic spine: Secondary | ICD-10-CM

## 2014-04-23 DIAGNOSIS — J310 Chronic rhinitis: Secondary | ICD-10-CM

## 2014-04-23 MED ORDER — LEVOFLOXACIN 500 MG PO TABS: 500.0000 mg | ORAL_TABLET | Freq: Every day | ORAL | Status: DC

## 2014-04-23 MED ORDER — METHOCARBAMOL 500 MG PO TABS: 500.0000 mg | ORAL_TABLET | Freq: Three times a day (TID) | ORAL | Status: DC | PRN

## 2014-04-23 MED ORDER — DICLOFENAC SODIUM 75 MG PO TBEC: 75.0000 mg | DELAYED_RELEASE_TABLET | Freq: Two times a day (BID) | ORAL | Status: DC

## 2014-04-23 NOTE — Progress Notes (Signed)
   Subjective:    Patient ID: Christina Blake, female    DOB: 02-10-1971, 43 y.o.   MRN: [id number]  Sinus Problem This is a new problem. The current episode started in the past 7 days. The problem has been gradually worsening since onset. There has been no fever. Associated symptoms include coughing and headaches. (Body aches, Back Pain & Runny Nose.) Past treatments include nothing.   Sinus and achey  Upper resp  Upper mid bk pain left more than right, using aleave and ibi, no excessive lifting, just the g bay    Sinus pressure and headache cong, prod yellow sputum   Also notes upper mid back pain for 1 week the left greater than right. Deep ache worse with certain motions. Does do some lifting professionally    Review of Systems  Respiratory: Positive for cough.   Neurological: Positive for headaches.       Objective:   Physical Exam  Alert mild malaise H&T moderate frontal maxillary tenderness trace normal neck supple. Lungs clear heart regular in rhythm. Left paraspinal mid thorax tender to palpation more so than right      Assessment & Plan:  Impression 1 rhinosinusitis #2 thoracic strain plan anti-inflammatory muscle spasm as prescribed antibiotics prescribed. Local measures discussed. [facility]

## 2014-07-28 ENCOUNTER — Encounter: Payer: Self-pay | Admitting: Family Medicine

## 2014-07-28 ENCOUNTER — Ambulatory Visit (INDEPENDENT_AMBULATORY_CARE_PROVIDER_SITE_OTHER): Payer: BLUE CROSS/BLUE SHIELD | Admitting: Family Medicine

## 2014-07-28 VITALS — BP 122/82 | Ht 67.0 in | Wt 167.0 lb

## 2014-07-28 DIAGNOSIS — M5412 Radiculopathy, cervical region: Secondary | ICD-10-CM

## 2014-07-28 DIAGNOSIS — M542 Cervicalgia: Secondary | ICD-10-CM

## 2014-07-28 DIAGNOSIS — A084 Viral intestinal infection, unspecified: Secondary | ICD-10-CM

## 2014-07-28 MED ORDER — LIDOCAINE 5 % EX PTCH: 1.0000 | MEDICATED_PATCH | CUTANEOUS | Status: DC

## 2014-07-28 MED ORDER — GABAPENTIN 100 MG PO CAPS: 100.0000 mg | ORAL_CAPSULE | Freq: Three times a day (TID) | ORAL | Status: DC

## 2014-07-28 MED ORDER — DICLOFENAC SODIUM 75 MG PO TBEC: 75.0000 mg | DELAYED_RELEASE_TABLET | Freq: Two times a day (BID) | ORAL | Status: DC

## 2014-07-28 MED ORDER — PROMETHAZINE HCL 25 MG PO TABS: 25.0000 mg | ORAL_TABLET | Freq: Three times a day (TID) | ORAL | Status: DC | PRN

## 2014-07-28 NOTE — Progress Notes (Signed)
   Subjective:    Patient ID: Christina Blake, female    DOB: December 30, 1970, 44 y.o.   MRN: [id number]  HPI  Patient arrives for complain of neck and shoulder pain for a while. Patient relates pain in the back of the neck radiates into the upper trapezius down into the back part of the triceps she denies weakness in the arm but states the pain she cannot relieve it's been present for over a month has not gotten better with anti-inflammatories has been taking strong pain medication for. Has had previous neck surgery she had plain x-rays done a couple weeks ago was told that looked okay  The pain keeps her awake at night has hard time sleeping she has limited what type of physical activity she does because of the pain in the discomfort.   Patient also having nausea and diarrhea this afternoon. She is had frequent diarrhea her mother had similar symptoms. She denies bloody stools high fevers or dysuria  Review of Systems Patient with positive neck pain positive pain down into the arm denies cough shortness of breath relates abdominal discomforts nausea and diarrhea denies vomiting denies bloody stools    Objective:   Physical Exam Very brisk reflexes in both arms and in the legs hyperreflexia 3+ strength in the hands as well as the feet normal No clonus Tender posterior neck surgical scar anterior neck is noted subjected discomfort through the right trapezius into the right shoulder as well as down into the tricep. Range of motion of shoulder is good. No sign of rotator cuff issues.  Lungs clear heart regular abdomen soft     Assessment & Plan:  History of cervical neck fusion Severe cervical pain radiating into the arm present for over a month has tried anti-inflammatories stretching and has tried taking strong pain medicine without relief Patient would benefit from having MRI of the cervical spine she would also benefit from continuing her pain medicine and adding a Lidoderm patch. She will  use a patch no more than 12 hours per day. Also recommend Neurontin 100 mg 1 3 times a day gradually titrate up to this dose. May need referral back to her neck specialist for possible injections await MRI.  I believe that it is medically indicated to do the MRI because of the pain she is having hyperreflexia as well as no relief despite multiple conservative measures over the past month.  Viral gastroenteritis Phenergan as needed for the nausea patient Arty tried Zofran without help she was told not to drive or work while taking this. Work excuse for the rest of this week. Warning signs regarding fevers bloody stools discussed.

## 2014-08-04 ENCOUNTER — Ambulatory Visit (HOSPITAL_COMMUNITY)
Admission: RE | Admit: 2014-08-04 | Discharge: 2014-08-04 | Disposition: A | Discharge status: Home / Self Care | Payer: BLUE CROSS/BLUE SHIELD | Source: Ambulatory Visit | Attending: Family Medicine | Admitting: Family Medicine

## 2014-08-04 DIAGNOSIS — M79601 Pain in right arm: Secondary | ICD-10-CM | POA: Diagnosis not present

## 2014-08-04 DIAGNOSIS — M542 Cervicalgia: Secondary | ICD-10-CM | POA: Diagnosis present

## 2014-08-04 DIAGNOSIS — M25511 Pain in right shoulder: Secondary | ICD-10-CM | POA: Diagnosis not present

## 2014-08-05 NOTE — Progress Notes (Signed)
Patient notified and verbalized understanding of the test results. No further questions. Pt saw Dr. Vertell Limber and will be calling them to schedule an appt with them. I told pt if she has trouble with getting an appt, to call us back. Pt verbalized understanding.

## 2014-08-07 ENCOUNTER — Other Ambulatory Visit: Payer: Self-pay | Admitting: Neurosurgery

## 2014-08-28 ENCOUNTER — Encounter (HOSPITAL_COMMUNITY): Payer: Self-pay

## 2014-08-28 ENCOUNTER — Encounter (HOSPITAL_COMMUNITY)
Admission: RE | Admit: 2014-08-28 | Discharge: 2014-08-28 | Disposition: A | Discharge status: Home / Self Care | Payer: BLUE CROSS/BLUE SHIELD | Source: Ambulatory Visit | Attending: Neurosurgery | Admitting: Neurosurgery

## 2014-08-28 DIAGNOSIS — Z01812 Encounter for preprocedural laboratory examination: Secondary | ICD-10-CM | POA: Diagnosis present

## 2014-08-28 LAB — SURGICAL PCR SCREEN
MRSA, PCR: NEGATIVE
STAPHYLOCOCCUS AUREUS: NEGATIVE

## 2014-08-28 LAB — BASIC METABOLIC PANEL
ANION GAP: 5 (ref 5–15)
BUN: 12 mg/dL (ref 6–23)
CALCIUM: 9.3 mg/dL (ref 8.4–10.5)
CHLORIDE: 108 mmol/L (ref 96–112)
CO2: 21 mmol/L (ref 19–32)
Creatinine, Ser: 0.74 mg/dL (ref 0.50–1.10)
GFR calc Af Amer: >90 mL/min (ref >90)
GFR calc non Af Amer: >90 mL/min (ref >90)
Glucose, Bld: 88 mg/dL (ref 70–99)
Potassium: 4.6 mmol/L (ref 3.5–5.1)
SODIUM: 134 mmol/L — AB (ref 135–145)

## 2014-08-28 LAB — CBC
HEMATOCRIT: 41.1 % (ref 36.0–46.0)
Hemoglobin: 14 g/dL (ref 12.0–15.0)
MCH: 30.8 pg (ref 26.0–34.0)
MCHC: 34.1 g/dL (ref 30.0–36.0)
MCV: 90.5 fL (ref 78.0–100.0)
Platelets: 173 10*3/uL (ref 150–400)
RBC: 4.54 MIL/uL (ref 3.87–5.11)
RDW: 12.9 % (ref 11.5–15.5)
WBC: 7.9 10*3/uL (ref 4.0–10.5)

## 2014-08-28 LAB — HCG, SERUM, QUALITATIVE: Preg, Serum: NEGATIVE

## 2014-08-28 NOTE — Progress Notes (Addendum)
no further episodes with PE.  She denies any cardiac issues.

## 2014-08-28 NOTE — Pre-Procedure Instructions (Addendum)
AYUSHI PLA  08/28/2014   Your procedure is scheduled on:  Friday, March 4th   Report to [facility] Admitting at 6:15 AM.             (Arrival time is per your surgeon's request)    Call this number if you have problems the morning of surgery: [phone]   Remember:   Do not eat food or drink liquids after midnight Thursday.   Take these medicines the morning of surgery with A SIP OF WATER: Xanax, Gabapentin, Oxycodone   Do not wear jewelry, make-up or nail polish.  Do not wear lotions, powders, or perfumes. You may NOT wear deodorant the morning of surgery.  Do not shave 48 hours prior to surgery.    Do not bring valuables to the hospital.  [facility] is not responsible for any belongings or valuables.               Contacts, dentures or bridgework may not be worn into surgery.  Leave suitcase in the car. After surgery it may be brought to your room.  For patients admitted to the hospital, discharge time is determined by your treatment team.                 Name and phone number of your driver:    Special Instructions: "Preparing for Surgery" instruction sheet.   Please read over the following fact sheets that you were given: Pain Booklet, Coughing and Deep Breathing, MRSA Information and Surgical Site Infection Prevention

## 2014-08-29 NOTE — H&P (Signed)
Patient ID:   [id number] Patient: Christina Blake  Date of Birth: February 05, 1971 Visit Type: Office Visit   Date: 08/05/2014 03:30 PM Provider: Marchia Meiers. Vertell Limber MD   This 44 year old female presents for neck pain.  History of Present Illness: 1.  neck pain  Patient returns to discuss her cervical MRI, noting increased posterior cervical right deltoid and right upper arm pain.  Percocet 04/04/24 and Robaxin 500 mg of her little relief  MRI on [place]  Patient is complaining of increasing right-sided neck and arm pain.  She says it is getting quite miserable.  It is worsened since I last saw her.  Her cervical MRI shows a ruptured disc at C4 C5 on the right with right C5 nerve root compression and a central disc protrusion at C67.  Her prior fusion appears to be solid and without complicating features at the C5 C6 level.  Physical examination reveals right deltoid weakness at 4 out of 5.  The patient complains of significant neck pain in addition to right arm pain.  He is also continuing to smoke despite my admonitions and encouragement for her to stop.      Medical/Surgical/Interim History Reviewed, no change.   PAST MEDICAL HISTORY, SURGICAL HISTORY, FAMILY HISTORY, SOCIAL HISTORY AND REVIEW OF SYSTEMS I have reviewed the patient's past medical, surgical, family and social history as well as the comprehensive review of systems as included on the [facility] history form dated 06/17/2014, which I have signed.  Family History: Reviewed, no changes.    Social History: Tobacco use reviewed. Reviewed, no changes.     MEDICATIONS(added, continued or stopped this visit): Started Medication Directions Instruction Stopped   lamotrigine 200 mg tablet take 1 tablet by oral route  every day    10/08/2013 methocarbamol 500 mg tablet take 1 tablet by oral route 3 times every day  08/05/2014  08/05/2014 methocarbamol 500 mg tablet take 1 tablet by oral route 3  times every day     Paxil 40 mg tablet take 1 tablet by oral route  every day    06/17/2014 Percocet 10 mg-325 mg tablet take 1 tablet by oral route  every 12 hours as needed  08/05/2014  08/05/2014 Percocet 10 mg-325 mg tablet take 1 tablet by oral route  every 6 hours as needed     trazodone 100 mg tablet take 1 tablet by oral route  every day     Xanax 0.5 mg tablet take 1 tablet by oral route 3 times every day       ALLERGIES: Ingredient Reaction Medication Name Comment  CEPHALEXIN MONOHYDRATE  KEFLEX    Reviewed, no changes.    Vitals Date Temp F BP Pulse Ht In Wt Lb BMI BSA Pain Score  08/05/2014  122/78 74 66 165 26.63  8/10     DIAGNOSTIC RESULTS Diagnostic report text  CLINICAL DATA: Two-month history of neck and right-sided shoulder and arm pain. History of prior cervical fusion in 2014.  EXAM: MRI CERVICAL SPINE WITHOUT CONTRAST  TECHNIQUE: Multiplanar and multiecho pulse sequences of the cervical spine, to include the craniocervical junction and cervicothoracic junction, and the thoracic spine, were obtained without intravenous contrast.  COMPARISON: MRI 02/18/2013 and radiographs 06/17/2014  FINDINGS: MRI CERVICAL SPINE FINDINGS  Mild straightening of the normal cervical lordosis. The vertebral bodies demonstrate normal marrow signal. Anterior and interbody fusion changes are noted at C5-6. No complicating features. The cervical spinal cord demonstrates normal signal intensity. No cord  lesions or syrinx.  C2-3: No significant findings.  C3-4: No significant findings.  C4-5: There is a moderate-sized central, right paracentral and right foraminal disc protrusion with mass effect on the thecal sac and right C5 nerve root.  C5-6: Anterior and interbody fusion changes. No complicating features. No spinal or foraminal stenosis.  C6-7: Focal central disc protrusion with focal mass effect on the ventral thecal sac and narrowing of the ventral CSF  space. No foraminal stenosis.  C7-T1: No significant findings  IMPRESSION:  1. Postoperative changes at C5-6 with anterior and interbody fusion. No complicating features. No spinal or foraminal stenosis. 2. Unfortunately, there are now disc protrusions above and below the fusion level as discussed above.   Electronically Signed By: Kalman Jewels M.D. On: 08/04/2014 16:55    IMPRESSION Patient has disc herniations at C4 C5 on the right and C6 C7 centrally.  Both of these are problematic and both may be contributing to her neck pain although her radiculopathy is coming from the C4 C5 level.  The severity of her pain and weakness I recommended proceeding with surgery.  This will consist of exploration of fusion at C5 C6 with removal of previously placed helix anterior cervical plate with anterior cervical decompression and fusion at C4 C5 and C6 C7 levels.  Risks and benefits were discussed with the patient and she wishes to proceed.  She is once again been encouraged to stop smoking.  Completed Orders (this encounter) Order Details Reason Side Interpretation Result Initial Treatment Date Region  Lifestyle education regarding diet Encouraged to eat a well balanced diet and follow up with primary care physician.         Assessment/Plan # Detail Type Description   1. Assessment Body mass index (BMI) 26.0-26.9, adult (R15.40).   Plan Orders Today's instructions / counseling include(s) Lifestyle education regarding diet.       2. Assessment Radiculopathy, cervical region (M54.12).       3. Assessment Cervical spondylosis without myelopathy (M47.812).       4. Assessment Cervicalgia (M54.2).       5. Assessment Herniated nucleus pulposus, cervical (M50.20).         Pain Assessment/Treatment Pain Scale: 8/10. Method: Numeric Pain Intensity Scale. Location: neck/right arm. Onset: 07/10/2012. Duration: varies. Quality: discomforting. Pain Assessment/Treatment follow-up plan of  care: Patient is taking medications as prescribed..  Anterior cervical decompression and fusion C4 C5 and C6 C7 levels with exploration of prior fusion at C5 C6.  This will be done on an expedited basis because of the patient's pain and weakness.  Orders: Instruction(s)/Education: Assessment Instruction  [phone] Lifestyle education regarding diet    MEDICATIONS PRESCRIBED TODAY    Rx Quantity Refills  PERCOCET 10 mg-325 mg  60 0  METHOCARBAMOL 500 mg  90 3            Provider:  Marchia Meiers. Vertell Limber MD  08/06/2014 10:58 AM Dictation edited by: Marchia Meiers. Vertell Limber    CC Providers: [facility] [address] [place],  [place]  [id number]   [facility] [address] [place], [place] [id number]-              Electronically signed by Marchia Meiers. Vertell Limber MD on 08/06/2014 10:58 AM

## 2014-09-03 MED ORDER — CEFAZOLIN SODIUM-DEXTROSE 2-3 GM-% IV SOLR
2.0000 g | INTRAVENOUS | Status: AC
  Administered 2014-09-04: 2 g via INTRAVENOUS
  Filled 2014-09-03: qty 50

## 2014-09-04 ENCOUNTER — Encounter (HOSPITAL_COMMUNITY)
Admission: RE | Disposition: A | Discharge status: Home / Self Care | Payer: Self-pay | Source: Ambulatory Visit | Attending: Neurosurgery

## 2014-09-04 ENCOUNTER — Ambulatory Visit (HOSPITAL_COMMUNITY): Payer: BLUE CROSS/BLUE SHIELD | Admitting: Anesthesiology

## 2014-09-04 ENCOUNTER — Encounter (HOSPITAL_COMMUNITY): Payer: Self-pay | Admitting: *Deleted

## 2014-09-04 ENCOUNTER — Ambulatory Visit (HOSPITAL_COMMUNITY): Payer: BLUE CROSS/BLUE SHIELD

## 2014-09-04 ENCOUNTER — Inpatient Hospital Stay (HOSPITAL_COMMUNITY)
Admission: RE | Admit: 2014-09-04 | Discharge: 2014-09-05 | DRG: 473 | Disposition: A | Discharge status: Home / Self Care | Payer: BLUE CROSS/BLUE SHIELD | Source: Ambulatory Visit | Attending: Neurosurgery | Admitting: Neurosurgery

## 2014-09-04 DIAGNOSIS — M5012 Cervical disc disorder with radiculopathy, mid-cervical region: Secondary | ICD-10-CM | POA: Diagnosis present

## 2014-09-04 DIAGNOSIS — F419 Anxiety disorder, unspecified: Secondary | ICD-10-CM | POA: Diagnosis present

## 2014-09-04 DIAGNOSIS — F17210 Nicotine dependence, cigarettes, uncomplicated: Secondary | ICD-10-CM | POA: Diagnosis present

## 2014-09-04 DIAGNOSIS — M4722 Other spondylosis with radiculopathy, cervical region: Secondary | ICD-10-CM | POA: Diagnosis present

## 2014-09-04 DIAGNOSIS — M542 Cervicalgia: Secondary | ICD-10-CM | POA: Diagnosis present

## 2014-09-04 DIAGNOSIS — M5020 Other cervical disc displacement, unspecified cervical region: Secondary | ICD-10-CM | POA: Diagnosis present

## 2014-09-04 DIAGNOSIS — F329 Major depressive disorder, single episode, unspecified: Secondary | ICD-10-CM | POA: Diagnosis present

## 2014-09-04 DIAGNOSIS — Z419 Encounter for procedure for purposes other than remedying health state, unspecified: Secondary | ICD-10-CM

## 2014-09-04 HISTORY — PX: ANTERIOR CERVICAL DECOMP/DISCECTOMY FUSION: SHX1161

## 2014-09-04 SURGERY — ANTERIOR CERVICAL DECOMPRESSION/DISCECTOMY FUSION 2 LEVELS
Anesthesia: General

## 2014-09-04 MED ORDER — SENNA 8.6 MG PO TABS
1.0000 | ORAL_TABLET | Freq: Two times a day (BID) | ORAL | Status: DC
  Administered 2014-09-04: 8.6 mg via ORAL
  Filled 2014-09-04 (×3): qty 1

## 2014-09-04 MED ORDER — METHOCARBAMOL 500 MG PO TABS
500.0000 mg | ORAL_TABLET | Freq: Four times a day (QID) | ORAL | Status: DC | PRN
  Administered 2014-09-04 – 2014-09-05 (×4): 500 mg via ORAL
  Filled 2014-09-04 (×5): qty 1

## 2014-09-04 MED ORDER — EPHEDRINE SULFATE 50 MG/ML IJ SOLN
INTRAMUSCULAR | Status: AC
  Filled 2014-09-04: qty 1

## 2014-09-04 MED ORDER — DIAZEPAM 5 MG/ML IJ SOLN
INTRAMUSCULAR | Status: AC
  Filled 2014-09-04: qty 2

## 2014-09-04 MED ORDER — HYDROMORPHONE HCL 1 MG/ML IJ SOLN
INTRAMUSCULAR | Status: AC
Start: 2014-09-04 — End: 2014-09-05
  Filled 2014-09-04: qty 1

## 2014-09-04 MED ORDER — THROMBIN 5000 UNITS EX SOLR
OROMUCOSAL | Status: DC | PRN
  Administered 2014-09-04: 11:00:00 via TOPICAL

## 2014-09-04 MED ORDER — TRAZODONE HCL 100 MG PO TABS
100.0000 mg | ORAL_TABLET | Freq: Every day | ORAL | Status: DC
  Administered 2014-09-04: 100 mg via ORAL
  Filled 2014-09-04 (×2): qty 1

## 2014-09-04 MED ORDER — ONDANSETRON HCL 4 MG/2ML IJ SOLN: 4.0000 mg | INTRAMUSCULAR | Status: DC | PRN

## 2014-09-04 MED ORDER — PANTOPRAZOLE SODIUM 40 MG IV SOLR
40.0000 mg | Freq: Every day | INTRAVENOUS | Status: DC
Start: 2014-09-04 — End: 2014-09-04
  Filled 2014-09-04: qty 40

## 2014-09-04 MED ORDER — LIDOCAINE 5 % EX PTCH
1.0000 | MEDICATED_PATCH | CUTANEOUS | Status: DC
  Administered 2014-09-04: 1 via TRANSDERMAL
  Filled 2014-09-04 (×2): qty 1

## 2014-09-04 MED ORDER — THROMBIN 5000 UNITS EX SOLR
CUTANEOUS | Status: DC | PRN
  Administered 2014-09-04 (×2): 5000 [IU] via TOPICAL

## 2014-09-04 MED ORDER — ACETAMINOPHEN 325 MG PO TABS: 650.0000 mg | ORAL_TABLET | ORAL | Status: DC | PRN

## 2014-09-04 MED ORDER — DIAZEPAM 5 MG/ML IJ SOLN
1.0000 mg | Freq: Three times a day (TID) | INTRAMUSCULAR | Status: AC | PRN
  Administered 2014-09-04 (×2): 1 mg via INTRAVENOUS

## 2014-09-04 MED ORDER — ALPRAZOLAM 0.5 MG PO TABS
0.5000 mg | ORAL_TABLET | Freq: Three times a day (TID) | ORAL | Status: DC | PRN
  Administered 2014-09-04: 0.5 mg via ORAL
  Filled 2014-09-04: qty 1

## 2014-09-04 MED ORDER — PROMETHAZINE HCL 25 MG PO TABS: 25.0000 mg | ORAL_TABLET | Freq: Three times a day (TID) | ORAL | Status: DC | PRN

## 2014-09-04 MED ORDER — PHENOL 1.4 % MT LIQD: 1.0000 | OROMUCOSAL | Status: DC | PRN

## 2014-09-04 MED ORDER — HYDROMORPHONE HCL 1 MG/ML IJ SOLN
0.2500 mg | INTRAMUSCULAR | Status: AC | PRN
  Administered 2014-09-04 (×8): 0.5 mg via INTRAVENOUS

## 2014-09-04 MED ORDER — OXYCODONE HCL 5 MG PO TABS
ORAL_TABLET | ORAL | Status: AC
  Filled 2014-09-04: qty 1

## 2014-09-04 MED ORDER — SUFENTANIL CITRATE 50 MCG/ML IV SOLN
INTRAVENOUS | Status: AC
  Filled 2014-09-04: qty 1

## 2014-09-04 MED ORDER — LIDOCAINE-EPINEPHRINE 1 %-1:100000 IJ SOLN
INTRAMUSCULAR | Status: DC | PRN
  Administered 2014-09-04: 5 mL

## 2014-09-04 MED ORDER — SODIUM CHLORIDE 0.9 % IV SOLN: 250.0000 mL | INTRAVENOUS | Status: DC

## 2014-09-04 MED ORDER — ARTIFICIAL TEARS OP OINT
TOPICAL_OINTMENT | OPHTHALMIC | Status: DC | PRN
  Administered 2014-09-04: 1 via OPHTHALMIC

## 2014-09-04 MED ORDER — KETOROLAC TROMETHAMINE 30 MG/ML IJ SOLN
INTRAMUSCULAR | Status: AC
  Filled 2014-09-04: qty 1

## 2014-09-04 MED ORDER — PHENYLEPHRINE 40 MCG/ML (10ML) SYRINGE FOR IV PUSH (FOR BLOOD PRESSURE SUPPORT)
PREFILLED_SYRINGE | INTRAVENOUS | Status: AC
  Filled 2014-09-04: qty 10

## 2014-09-04 MED ORDER — PROPOFOL 10 MG/ML IV BOLUS
INTRAVENOUS | Status: AC
  Filled 2014-09-04: qty 20

## 2014-09-04 MED ORDER — 0.9 % SODIUM CHLORIDE (POUR BTL) OPTIME
TOPICAL | Status: DC | PRN
  Administered 2014-09-04: 1000 mL

## 2014-09-04 MED ORDER — GABAPENTIN 100 MG PO CAPS
100.0000 mg | ORAL_CAPSULE | Freq: Three times a day (TID) | ORAL | Status: DC
  Administered 2014-09-04: 100 mg via ORAL
  Filled 2014-09-04 (×4): qty 1

## 2014-09-04 MED ORDER — KETOROLAC TROMETHAMINE 30 MG/ML IJ SOLN
30.0000 mg | Freq: Once | INTRAMUSCULAR | Status: AC
  Administered 2014-09-04: 30 mg via INTRAVENOUS

## 2014-09-04 MED ORDER — SUFENTANIL CITRATE 50 MCG/ML IV SOLN
INTRAVENOUS | Status: DC | PRN
  Administered 2014-09-04 (×2): 5 ug via INTRAVENOUS
  Administered 2014-09-04: 10 ug via INTRAVENOUS
  Administered 2014-09-04 (×2): 5 ug via INTRAVENOUS
  Administered 2014-09-04: 10 ug via INTRAVENOUS

## 2014-09-04 MED ORDER — MIDAZOLAM HCL 5 MG/5ML IJ SOLN
INTRAMUSCULAR | Status: DC | PRN
  Administered 2014-09-04 (×2): 1 mg via INTRAVENOUS

## 2014-09-04 MED ORDER — ROCURONIUM BROMIDE 50 MG/5ML IV SOLN
INTRAVENOUS | Status: AC
  Filled 2014-09-04: qty 1

## 2014-09-04 MED ORDER — HYDROMORPHONE HCL 1 MG/ML IJ SOLN: 0.5000 mg | INTRAMUSCULAR | Status: DC | PRN

## 2014-09-04 MED ORDER — GLYCOPYRROLATE 0.2 MG/ML IJ SOLN
INTRAMUSCULAR | Status: AC
  Filled 2014-09-04: qty 3

## 2014-09-04 MED ORDER — CEFAZOLIN SODIUM 1-5 GM-% IV SOLN
1.0000 g | Freq: Three times a day (TID) | INTRAVENOUS | Status: AC
  Administered 2014-09-04 – 2014-09-05 (×2): 1 g via INTRAVENOUS
  Filled 2014-09-04 (×2): qty 50

## 2014-09-04 MED ORDER — PAROXETINE HCL ER 25 MG PO TB24
50.0000 mg | ORAL_TABLET | Freq: Every evening | ORAL | Status: DC
  Administered 2014-09-04: 50 mg via ORAL
  Filled 2014-09-04 (×2): qty 2

## 2014-09-04 MED ORDER — HEMOSTATIC AGENTS (NO CHARGE) OPTIME
TOPICAL | Status: DC | PRN
  Administered 2014-09-04: 1 via TOPICAL

## 2014-09-04 MED ORDER — LACTATED RINGERS IV SOLN
INTRAVENOUS | Status: DC | PRN
  Administered 2014-09-04 (×2): via INTRAVENOUS

## 2014-09-04 MED ORDER — METHOCARBAMOL 500 MG PO TABS
ORAL_TABLET | ORAL | Status: AC
  Filled 2014-09-04: qty 1

## 2014-09-04 MED ORDER — LACTATED RINGERS IV SOLN
INTRAVENOUS | Status: DC
  Administered 2014-09-04: 07:00:00 via INTRAVENOUS

## 2014-09-04 MED ORDER — SUCCINYLCHOLINE CHLORIDE 20 MG/ML IJ SOLN
INTRAMUSCULAR | Status: AC
  Filled 2014-09-04: qty 1

## 2014-09-04 MED ORDER — NEOSTIGMINE METHYLSULFATE 10 MG/10ML IV SOLN
INTRAVENOUS | Status: AC
  Filled 2014-09-04: qty 1

## 2014-09-04 MED ORDER — SODIUM CHLORIDE 0.9 % IJ SOLN
3.0000 mL | Freq: Two times a day (BID) | INTRAMUSCULAR | Status: DC
  Administered 2014-09-04: 3 mL via INTRAVENOUS

## 2014-09-04 MED ORDER — METHOCARBAMOL 500 MG PO TABS
500.0000 mg | ORAL_TABLET | Freq: Three times a day (TID) | ORAL | Status: DC | PRN
  Filled 2014-09-04: qty 1

## 2014-09-04 MED ORDER — OXYCODONE-ACETAMINOPHEN 10-325 MG PO TABS: 1.0000 | ORAL_TABLET | ORAL | Status: DC | PRN

## 2014-09-04 MED ORDER — NEOSTIGMINE METHYLSULFATE 10 MG/10ML IV SOLN
INTRAVENOUS | Status: DC | PRN
  Administered 2014-09-04: 4 mg via INTRAVENOUS

## 2014-09-04 MED ORDER — HYDROMORPHONE HCL 1 MG/ML IJ SOLN
0.5000 mg | INTRAMUSCULAR | Status: DC | PRN
  Administered 2014-09-04: 1 mg via INTRAVENOUS
  Filled 2014-09-04: qty 1

## 2014-09-04 MED ORDER — ALUM & MAG HYDROXIDE-SIMETH 200-200-20 MG/5ML PO SUSP: 30.0000 mL | Freq: Four times a day (QID) | ORAL | Status: DC | PRN

## 2014-09-04 MED ORDER — HYDROCODONE-ACETAMINOPHEN 5-325 MG PO TABS: 1.0000 | ORAL_TABLET | ORAL | Status: DC | PRN

## 2014-09-04 MED ORDER — LIDOCAINE HCL (CARDIAC) 20 MG/ML IV SOLN
INTRAVENOUS | Status: DC | PRN
  Administered 2014-09-04: 100 mg via INTRAVENOUS

## 2014-09-04 MED ORDER — DOCUSATE SODIUM 100 MG PO CAPS
100.0000 mg | ORAL_CAPSULE | Freq: Two times a day (BID) | ORAL | Status: DC
  Administered 2014-09-04: 100 mg via ORAL
  Filled 2014-09-04: qty 1

## 2014-09-04 MED ORDER — ROCURONIUM BROMIDE 100 MG/10ML IV SOLN
INTRAVENOUS | Status: DC | PRN
  Administered 2014-09-04: 40 mg via INTRAVENOUS
  Administered 2014-09-04: 10 mg via INTRAVENOUS

## 2014-09-04 MED ORDER — MIDAZOLAM HCL 2 MG/2ML IJ SOLN
INTRAMUSCULAR | Status: AC
  Filled 2014-09-04: qty 2

## 2014-09-04 MED ORDER — HYDROMORPHONE HCL 1 MG/ML IJ SOLN
INTRAMUSCULAR | Status: AC
  Filled 2014-09-04: qty 1

## 2014-09-04 MED ORDER — DEXAMETHASONE SODIUM PHOSPHATE 10 MG/ML IJ SOLN
INTRAMUSCULAR | Status: DC | PRN
  Administered 2014-09-04: 10 mg via INTRAVENOUS

## 2014-09-04 MED ORDER — LIDOCAINE HCL 4 % MT SOLN
OROMUCOSAL | Status: DC | PRN
  Administered 2014-09-04: 4 mL via TOPICAL

## 2014-09-04 MED ORDER — LIDOCAINE HCL (CARDIAC) 20 MG/ML IV SOLN
INTRAVENOUS | Status: AC
  Filled 2014-09-04: qty 10

## 2014-09-04 MED ORDER — LAMOTRIGINE 200 MG PO TABS
200.0000 mg | ORAL_TABLET | Freq: Every day | ORAL | Status: DC
  Administered 2014-09-04: 200 mg via ORAL
  Filled 2014-09-04 (×2): qty 1

## 2014-09-04 MED ORDER — SODIUM CHLORIDE 0.9 % IJ SOLN
INTRAMUSCULAR | Status: AC
  Filled 2014-09-04: qty 20

## 2014-09-04 MED ORDER — PROPOFOL 10 MG/ML IV BOLUS
INTRAVENOUS | Status: DC | PRN
  Administered 2014-09-04: 150 mg via INTRAVENOUS

## 2014-09-04 MED ORDER — DEXTROSE 5 % IV SOLN
500.0000 mg | Freq: Four times a day (QID) | INTRAVENOUS | Status: DC | PRN
  Filled 2014-09-04: qty 5

## 2014-09-04 MED ORDER — OXYCODONE-ACETAMINOPHEN 5-325 MG PO TABS
1.0000 | ORAL_TABLET | ORAL | Status: DC | PRN
  Administered 2014-09-04 – 2014-09-05 (×4): 2 via ORAL
  Filled 2014-09-04 (×4): qty 2

## 2014-09-04 MED ORDER — SODIUM CHLORIDE 0.9 % IJ SOLN: 3.0000 mL | INTRAMUSCULAR | Status: DC | PRN

## 2014-09-04 MED ORDER — ZOLPIDEM TARTRATE 5 MG PO TABS: 5.0000 mg | ORAL_TABLET | Freq: Every evening | ORAL | Status: DC | PRN

## 2014-09-04 MED ORDER — ACETAMINOPHEN 650 MG RE SUPP: 650.0000 mg | RECTAL | Status: DC | PRN

## 2014-09-04 MED ORDER — PHENYLEPHRINE HCL 10 MG/ML IJ SOLN
INTRAMUSCULAR | Status: DC | PRN
  Administered 2014-09-04 (×2): 40 ug via INTRAVENOUS

## 2014-09-04 MED ORDER — ONDANSETRON HCL 4 MG/2ML IJ SOLN
INTRAMUSCULAR | Status: DC | PRN
  Administered 2014-09-04: 4 mg via INTRAVENOUS

## 2014-09-04 MED ORDER — SODIUM CHLORIDE 0.9 % IJ SOLN
INTRAMUSCULAR | Status: AC
  Filled 2014-09-04: qty 10

## 2014-09-04 MED ORDER — PANTOPRAZOLE SODIUM 40 MG PO TBEC
40.0000 mg | DELAYED_RELEASE_TABLET | Freq: Every day | ORAL | Status: DC
Start: 2014-09-04 — End: 2014-09-05
  Administered 2014-09-04: 40 mg via ORAL
  Filled 2014-09-04: qty 1

## 2014-09-04 MED ORDER — OXYCODONE HCL 5 MG PO TABS
5.0000 mg | ORAL_TABLET | Freq: Once | ORAL | Status: AC | PRN
  Administered 2014-09-04: 5 mg via ORAL

## 2014-09-04 MED ORDER — KCL IN DEXTROSE-NACL 20-5-0.45 MEQ/L-%-% IV SOLN
INTRAVENOUS | Status: DC
  Filled 2014-09-04 (×3): qty 1000

## 2014-09-04 MED ORDER — CALCIUM CARBONATE 600 MG PO TABS: 600.0000 mg | ORAL_TABLET | Freq: Every day | ORAL | Status: DC

## 2014-09-04 MED ORDER — ONDANSETRON HCL 4 MG/2ML IJ SOLN
INTRAMUSCULAR | Status: AC
  Filled 2014-09-04: qty 2

## 2014-09-04 MED ORDER — CALCIUM CARBONATE 1250 (500 CA) MG PO TABS
1.0000 | ORAL_TABLET | Freq: Every day | ORAL | Status: DC
Start: 2014-09-05 — End: 2014-09-05
  Filled 2014-09-04 (×2): qty 1

## 2014-09-04 MED ORDER — OXYCODONE HCL 5 MG/5ML PO SOLN: 5.0000 mg | Freq: Once | ORAL | Status: AC | PRN

## 2014-09-04 MED ORDER — GLYCOPYRROLATE 0.2 MG/ML IJ SOLN
INTRAMUSCULAR | Status: DC | PRN
Start: 2014-09-04 — End: 2014-09-04
  Administered 2014-09-04: 0.6 mg via INTRAVENOUS

## 2014-09-04 MED ORDER — MENTHOL 3 MG MT LOZG: 1.0000 | LOZENGE | OROMUCOSAL | Status: DC | PRN

## 2014-09-04 MED ORDER — EPHEDRINE SULFATE 50 MG/ML IJ SOLN
INTRAMUSCULAR | Status: DC | PRN
  Administered 2014-09-04 (×2): 5 mg via INTRAVENOUS

## 2014-09-04 MED ORDER — TEMAZEPAM 15 MG PO CAPS: 15.0000 mg | ORAL_CAPSULE | Freq: Every evening | ORAL | Status: DC | PRN

## 2014-09-04 MED ORDER — BUPIVACAINE HCL (PF) 0.5 % IJ SOLN
INTRAMUSCULAR | Status: DC | PRN
  Administered 2014-09-04: 5 mL

## 2014-09-04 MED ORDER — DEXAMETHASONE SODIUM PHOSPHATE 10 MG/ML IJ SOLN
INTRAMUSCULAR | Status: AC
  Filled 2014-09-04: qty 8

## 2014-09-04 MED ORDER — PROMETHAZINE HCL 25 MG/ML IJ SOLN: 6.2500 mg | INTRAMUSCULAR | Status: DC | PRN

## 2014-09-04 SURGICAL SUPPLY — 67 items
ALLOGRAFT LORDOTIC 8X11X14 (Bone Implant) ×6 IMPLANT
BENZOIN TINCTURE PRP APPL 2/3 (GAUZE/BANDAGES/DRESSINGS) IMPLANT
BIT DRILL NEURO 2X3.1 SFT TUCH (MISCELLANEOUS) ×1 IMPLANT
BLADE ULTRA TIP 2M (BLADE) IMPLANT
BNDG GAUZE ELAST 4 BULKY (GAUZE/BANDAGES/DRESSINGS) IMPLANT
BUR BARREL STRAIGHT FLUTE 4.0 (BURR) ×3 IMPLANT
CANISTER SUCT 3000ML PPV (MISCELLANEOUS) ×3 IMPLANT
CLOSURE WOUND 1/2 X4 (GAUZE/BANDAGES/DRESSINGS)
CONT SPEC 4OZ CLIKSEAL STRL BL (MISCELLANEOUS) ×3 IMPLANT
COVER MAYO STAND STRL (DRAPES) ×3 IMPLANT
DECANTER SPIKE VIAL GLASS SM (MISCELLANEOUS) ×3 IMPLANT
DRAPE LAPAROTOMY 100X72 PEDS (DRAPES) ×3 IMPLANT
DRAPE MICROSCOPE LEICA (MISCELLANEOUS) ×3 IMPLANT
DRAPE POUCH INSTRU U-SHP 10X18 (DRAPES) ×3 IMPLANT
DRAPE PROXIMA HALF (DRAPES) IMPLANT
DRILL NEURO 2X3.1 SOFT TOUCH (MISCELLANEOUS) ×3
DRSG TELFA 3X8 NADH (GAUZE/BANDAGES/DRESSINGS) IMPLANT
DURAPREP 6ML APPLICATOR 50/CS (WOUND CARE) ×3 IMPLANT
ELECT COATED BLADE 2.86 ST (ELECTRODE) ×3 IMPLANT
ELECT REM PT RETURN 9FT ADLT (ELECTROSURGICAL) ×3
ELECTRODE REM PT RTRN 9FT ADLT (ELECTROSURGICAL) ×1 IMPLANT
GAUZE SPONGE 4X4 12PLY STRL (GAUZE/BANDAGES/DRESSINGS) IMPLANT
GAUZE SPONGE 4X4 16PLY XRAY LF (GAUZE/BANDAGES/DRESSINGS) IMPLANT
GLOVE BIO SURGEON STRL SZ8 (GLOVE) ×3 IMPLANT
GLOVE BIOGEL PI IND STRL 8 (GLOVE) ×1 IMPLANT
GLOVE BIOGEL PI IND STRL 8.5 (GLOVE) ×1 IMPLANT
GLOVE BIOGEL PI INDICATOR 8 (GLOVE) ×2
GLOVE BIOGEL PI INDICATOR 8.5 (GLOVE) ×2
GLOVE ECLIPSE 7.5 STRL STRAW (GLOVE) ×6 IMPLANT
GLOVE ECLIPSE 8.0 STRL XLNG CF (GLOVE) ×3 IMPLANT
GLOVE EXAM NITRILE LRG STRL (GLOVE) IMPLANT
GLOVE EXAM NITRILE MD LF STRL (GLOVE) IMPLANT
GLOVE EXAM NITRILE XL STR (GLOVE) IMPLANT
GLOVE EXAM NITRILE XS STR PU (GLOVE) IMPLANT
GLOVE INDICATOR 7.5 STRL GRN (GLOVE) ×6 IMPLANT
GLOVE SURG SS PI 7.5 STRL IVOR (GLOVE) ×6 IMPLANT
GOWN STRL REUS W/ TWL LRG LVL3 (GOWN DISPOSABLE) IMPLANT
GOWN STRL REUS W/ TWL XL LVL3 (GOWN DISPOSABLE) ×1 IMPLANT
GOWN STRL REUS W/TWL 2XL LVL3 (GOWN DISPOSABLE) ×3 IMPLANT
GOWN STRL REUS W/TWL LRG LVL3 (GOWN DISPOSABLE)
GOWN STRL REUS W/TWL XL LVL3 (GOWN DISPOSABLE) ×2
HALTER HD/CHIN CERV TRACTION D (MISCELLANEOUS) ×3 IMPLANT
KIT BASIN OR (CUSTOM PROCEDURE TRAY) ×3 IMPLANT
KIT ROOM TURNOVER OR (KITS) ×3 IMPLANT
LIQUID BAND (GAUZE/BANDAGES/DRESSINGS) ×3 IMPLANT
NEEDLE HYPO 18GX1.5 BLUNT FILL (NEEDLE) ×3 IMPLANT
NEEDLE HYPO 25X1 1.5 SAFETY (NEEDLE) ×3 IMPLANT
NEEDLE SPNL 22GX3.5 QUINCKE BK (NEEDLE) ×3 IMPLANT
NS IRRIG 1000ML POUR BTL (IV SOLUTION) ×3 IMPLANT
PACK LAMINECTOMY NEURO (CUSTOM PROCEDURE TRAY) ×3 IMPLANT
PAD ARMBOARD 7.5X6 YLW CONV (MISCELLANEOUS) ×3 IMPLANT
PIN DISTRACTION 14MM (PIN) ×6 IMPLANT
PLATE ARCHON 24MM 1LVL (Plate) ×6 IMPLANT
RUBBERBAND STERILE (MISCELLANEOUS) ×6 IMPLANT
SCREW ARCHON SELFTAP 4.0X13 (Screw) ×18 IMPLANT
SCREW ARCHON ST VAR 4.5X13MM (Screw) ×6 IMPLANT
SPONGE INTESTINAL PEANUT (DISPOSABLE) ×3 IMPLANT
SPONGE SURGIFOAM ABS GEL SZ50 (HEMOSTASIS) IMPLANT
STAPLER SKIN PROX WIDE 3.9 (STAPLE) IMPLANT
STRIP CLOSURE SKIN 1/2X4 (GAUZE/BANDAGES/DRESSINGS) IMPLANT
SUT VIC AB 3-0 SH 8-18 (SUTURE) ×6 IMPLANT
SYR 20ML ECCENTRIC (SYRINGE) ×3 IMPLANT
SYR 3ML LL SCALE MARK (SYRINGE) ×3 IMPLANT
TOWEL OR 17X24 6PK STRL BLUE (TOWEL DISPOSABLE) ×3 IMPLANT
TOWEL OR 17X26 10 PK STRL BLUE (TOWEL DISPOSABLE) ×3 IMPLANT
TRAP SPECIMEN MUCOUS 40CC (MISCELLANEOUS) ×3 IMPLANT
WATER STERILE IRR 1000ML POUR (IV SOLUTION) ×3 IMPLANT

## 2014-09-04 NOTE — Interval H&P Note (Signed)
History and Physical Interval Note:  09/04/2014 7:25 AM  Christina Blake  has presented today for surgery, with the diagnosis of Cervical HNP, Cervical radiculopathy, Cervical spondylosis without myelopathy, Cervicalgia  The various methods of treatment have been discussed with the patient and family. After consideration of risks, benefits and other options for treatment, the patient has consented to  Procedure(s) with comments: C4-5 C6-7 Anterior cervical decompression/diskectomy,fusion with exploration of C5-6 Fusion (Removal of Helix plate) (N/A) - C4-5 C6-7 Anterior cervical decompression/diskectomy,fusion with exploration of C5-6 Fusion (Removal of Helix plate) as a surgical intervention .  The patient's history has been reviewed, patient examined, no change in status, stable for surgery.  I have reviewed the patient's chart and labs.  Questions were answered to the patient's satisfaction.     Zinedine Ellner D

## 2014-09-04 NOTE — Progress Notes (Signed)
Awake, alert, conversant.  MAEW with full bilateral D/B/T/HI.  Doing well.

## 2014-09-04 NOTE — Anesthesia Preprocedure Evaluation (Addendum)
Anesthesia Evaluation  Patient identified by MRN, date of birth, ID band Patient awake    Reviewed: Allergy & Precautions, NPO status , Patient's Chart, lab work & pertinent test results  Airway Mallampati: I  TM Distance: >3 FB Neck ROM: Full    Dental  (+) Teeth Intact, Dental Advisory Given   Pulmonary Current Smoker,  breath sounds clear to auscultation        Cardiovascular negative cardio ROS  Rhythm:Regular Rate:Normal     Neuro/Psych Anxiety Depression  Neuromuscular disease    GI/Hepatic negative GI ROS, Neg liver ROS,   Endo/Other  negative endocrine ROS  Renal/GU negative Renal ROS     Musculoskeletal negative musculoskeletal ROS (+)   Abdominal   Peds  Hematology negative hematology ROS (+)   Anesthesia Other Findings   Reproductive/Obstetrics                            Anesthesia Physical Anesthesia Plan  ASA: II  Anesthesia Plan: General   Post-op Pain Management:    Induction: Intravenous  Airway Management Planned: Oral ETT  Additional Equipment:   Intra-op Plan:   Post-operative Plan: Extubation in OR  Informed Consent: I have reviewed the patients History and Physical, chart, labs and discussed the procedure including the risks, benefits and alternatives for the proposed anesthesia with the patient or authorized representative who has indicated his/her understanding and acceptance.   Dental advisory given  Plan Discussed with: CRNA  Anesthesia Plan Comments:         Anesthesia Quick Evaluation

## 2014-09-04 NOTE — Anesthesia Procedure Notes (Signed)
Procedure Name: Intubation Date/Time: 09/04/2014 10:22 AM Performed by: Suzy Bouchard Pre-anesthesia Checklist: Patient identified, Timeout performed, Emergency Drugs available, Suction available and Patient being monitored Patient Re-evaluated:Patient Re-evaluated prior to inductionOxygen Delivery Method: Circle system utilized Preoxygenation: Pre-oxygenation with 100% oxygen Intubation Type: IV induction Ventilation: Mask ventilation without difficulty Laryngoscope Size: Miller and 2 Grade View: Grade I Tube type: Oral Number of attempts: 1 Airway Equipment and Method: Stylet and LTA kit utilized Placement Confirmation: ETT inserted through vocal cords under direct vision,  breath sounds checked- equal and bilateral,  positive ETCO2 and CO2 detector Secured at: 21 cm Tube secured with: Tape Dental Injury: Teeth and Oropharynx as per pre-operative assessment

## 2014-09-04 NOTE — Anesthesia Postprocedure Evaluation (Signed)
  Anesthesia Post-op Note  Patient: Christina Blake  Procedure(s) Performed: Procedure(s): Cervical four-five Cervical six-seven Anterior cervical decompression/diskectomy,fusion with exploration of Cervical five-six Fusion (Removal of Helix plate) (N/A)  Patient Location: PACU  Anesthesia Type: General   Level of Consciousness: awake, alert  and oriented  Airway and Oxygen Therapy: Patient Spontanous Breathing  Post-op Pain: moderate  Post-op Assessment: Post-op Vital signs reviewed  Post-op Vital Signs: Reviewed  Last Vitals:  Filed Vitals:   09/04/14 1445  BP: 110/68  Pulse:   Temp:   Resp:     Complications: No apparent anesthesia complications

## 2014-09-04 NOTE — Progress Notes (Signed)
OT Cancellation Note  Patient Details Name: Christina Blake MRN: [id number] DOB: 15-Nov-1970   Cancelled Treatment:    Reason Eval/Treat Not Completed: Pain limiting ability to participate. Pt declined at this time. OT will follow up with pt in AM to complete evaluation.   Villa Herb M   Cyndie Chime, OTR/L Occupational Therapist [phone] (pager)  09/04/2014, 5:50 PM

## 2014-09-04 NOTE — Transfer of Care (Signed)
Immediate Anesthesia Transfer of Care Note  Patient: Christina Blake  Procedure(s) Performed: Procedure(s): Cervical four-five Cervical six-seven Anterior cervical decompression/diskectomy,fusion with exploration of Cervical five-six Fusion (Removal of Helix plate) (N/A)  Patient Location: PACU  Anesthesia Type:General  Level of Consciousness: awake and alert   Airway & Oxygen Therapy: Patient Spontanous Breathing and Patient connected to nasal cannula oxygen  Post-op Assessment: Report given to RN, Post -op Vital signs reviewed and stable and Patient moving all extremities  Post vital signs: Reviewed and stable  Last Vitals:  Filed Vitals:   09/04/14 1233  BP:   Pulse:   Temp: 37.2 C  Resp:     Complications: No apparent anesthesia complications

## 2014-09-04 NOTE — Brief Op Note (Signed)
09/04/2014  12:25 PM  PATIENT:  Christina Blake  44 y.o. female  PRE-OPERATIVE DIAGNOSIS:  Cervical Herniated Nucleus pulposus Cervical radiculopathy, Cervical spondylosis without myelopathy, Cervicalgia C45 and C 67 levels  POST-OPERATIVE DIAGNOSIS:  Cervical Herniated Nucleus pulposus Cervical radiculopathy, Cervical spondylosis without myelopathy, Cervicalgia C45 and C 67 levels   PROCEDURE:  Procedure(s): Cervical four-five Cervical six-seven Anterior cervical decompression/diskectomy,fusion with exploration of Cervical five-six Fusion (Removal of Helix plate) (N/A) with Lordotic allograft bone grafts and Anterior Cervical Plates  SURGEON:  Surgeon(s) and Role:    * Erline Levine, MD - Primary    * Hosie Spangle, MD - Assisting  PHYSICIAN ASSISTANT:   ASSISTANTS: Poteat, RN   ANESTHESIA:   general  EBL:  Total I/O In: 1000 [I.V.:1000] Out: -   BLOOD ADMINISTERED:none  DRAINS: none   LOCAL MEDICATIONS USED:  LIDOCAINE   SPECIMEN:  No Specimen  DISPOSITION OF SPECIMEN:  N/A  COUNTS:  YES  TOURNIQUET:  * No tourniquets in log *  DICTATION: DICTATION: Patient is 44 year old female with cervical disc herniation of C 45 and C 67 with stenosis, radiculopathy, HNP and cervical kyphosis.  It was elected to take her to surgery for anterior cervical decompression and fusion C 45 and C 67 levels.  PROCEDURE: Patient was brought to operating room and following the smooth and uncomplicated induction of general endotracheal anesthesia his head was placed on a horseshoe head holder he was placed in 5 pounds of Holter traction and her anterior neck was prepped and draped in usual sterile fashion. Previous incision was made on the left side of midline after infiltrating the skin and subcutaneous tissues with local lidocaine. The platysmal layer was incised and subplatysmal dissection was performed exposing the anterior border sternocleidomastoid muscle. Using blunt dissection the  carotid sheath was kept lateral and trachea and esophagus kept medial exposing the anterior cervical spine. The previous plate was exposed and removed.  The bony interfaces were carefully inspected and found to be fully healed without evidence of pseudoarthrosis.   A bent spinal needle was placed it was felt to be the C 45 and the C 67 levels and this was confirmed on intraoperative x-ray. Longus coli muscles were taken down from the anterior cervical spine using electrocautery and key elevator and self-retaining retractor was placed exposing the C 67 level. The interspace was incised and a thorough discectomy was performed. Distraction pins were placed. Uncinate spurs and central spondylitic ridges were drilled down with a high-speed drill. The spinal cord dura and both C 7 nerve roots were widely decompressed. Hemostasis was assured. After trial sizing an 8 mm lordotic allograft bone wedge was selected and packed with local autograft. This was tamped into position and countersunk appropriately. Distraction weight was removed. A 24 mm Nuvasive Archon anterior cervical plate was affixed to the cervical spine with 13 mm variable-angle screws 2 at C6, 2 at C7. All screws were well-positioned and locking mechanisms were engaged. Longus coli muscles were taken down from the anterior cervical spine using electrocautery and key elevator and self-retaining retractor was placed exposing the C 45 level. The interspace was incised and a thorough discectomy was performed. Distraction pins were placed. Uncinate spurs and central spondylitic ridges were drilled down with a high-speed drill. The spinal cord dura and both C 5 nerve roots were widely decompressed. Hemostasis was assured. After trial sizing an 8 mm lordotic allograft bone wedge was selected and packed with local autograft. This was tamped  into position and countersunk appropriately. Distraction weight was removed. A 24 mm Nuvasive Archon anterior cervical plate was  affixed to the cervical spine with 13 mm variable-angle screws 2 at C4, 2 at C5. All screws were well-positioned and locking mechanisms were engaged.A final X ray was obtained which showed well positioned graft and anterior plate without complicating features. Soft tissues were inspected and found to be in good repair. The wound was irrigated. The platysma layer was closed with 3-0 Vicryl stitches and the skin was reapproximated with 3-0 Vicryl subcuticular stitches. The wound was dressed with Dermabond. Counts were correct at the end of the case. Patient was extubated and taken to recovery in stable and satisfactory condition.   PLAN OF CARE: Admit for overnight observation  PATIENT DISPOSITION:  PACU - hemodynamically stable.   Delay start of Pharmacological VTE agent (>24hrs) due to surgical blood loss or risk of bleeding: yes

## 2014-09-04 NOTE — Op Note (Signed)
09/04/2014  12:25 PM  PATIENT:  Christina Blake  44 y.o. female  PRE-OPERATIVE DIAGNOSIS:  Cervical Herniated Nucleus pulposus Cervical radiculopathy, Cervical spondylosis without myelopathy, Cervicalgia C45 and C 67 levels  POST-OPERATIVE DIAGNOSIS:  Cervical Herniated Nucleus pulposus Cervical radiculopathy, Cervical spondylosis without myelopathy, Cervicalgia C45 and C 67 levels   PROCEDURE:  Procedure(s): Cervical four-five Cervical six-seven Anterior cervical decompression/diskectomy,fusion with exploration of Cervical five-six Fusion (Removal of Helix plate) (N/A) with Lordotic allograft bone grafts and Anterior Cervical Plates  SURGEON:  Surgeon(s) and Role:    * Erline Levine, MD - Primary    * Hosie Spangle, MD - Assisting  PHYSICIAN ASSISTANT:   ASSISTANTS: Poteat, RN   ANESTHESIA:   general  EBL:  Total I/O In: 1000 [I.V.:1000] Out: -   BLOOD ADMINISTERED:none  DRAINS: none   LOCAL MEDICATIONS USED:  LIDOCAINE   SPECIMEN:  No Specimen  DISPOSITION OF SPECIMEN:  N/A  COUNTS:  YES  TOURNIQUET:  * No tourniquets in log *  DICTATION: DICTATION: Patient is 44 year old female with cervical disc herniation of C 45 and C 67 with stenosis, radiculopathy, HNP and cervical kyphosis.  It was elected to take her to surgery for anterior cervical decompression and fusion C 45 and C 67 levels.  PROCEDURE: Patient was brought to operating room and following the smooth and uncomplicated induction of general endotracheal anesthesia his head was placed on a horseshoe head holder he was placed in 5 pounds of Holter traction and her anterior neck was prepped and draped in usual sterile fashion. Previous incision was made on the left side of midline after infiltrating the skin and subcutaneous tissues with local lidocaine. The platysmal layer was incised and subplatysmal dissection was performed exposing the anterior border sternocleidomastoid muscle. Using blunt dissection the  carotid sheath was kept lateral and trachea and esophagus kept medial exposing the anterior cervical spine. The previous plate was exposed and removed.  The bony interfaces were carefully inspected and found to be fully healed without evidence of pseudoarthrosis.   A bent spinal needle was placed it was felt to be the C 45 and the C 67 levels and this was confirmed on intraoperative x-ray. Longus coli muscles were taken down from the anterior cervical spine using electrocautery and key elevator and self-retaining retractor was placed exposing the C 67 level. The interspace was incised and a thorough discectomy was performed. Distraction pins were placed. Uncinate spurs and central spondylitic ridges were drilled down with a high-speed drill. The spinal cord dura and both C 7 nerve roots were widely decompressed. Hemostasis was assured. After trial sizing an 8 mm lordotic allograft bone wedge was selected and packed with local autograft. This was tamped into position and countersunk appropriately. Distraction weight was removed. A 24 mm Nuvasive Archon anterior cervical plate was affixed to the cervical spine with 13 mm variable-angle screws 2 at C6, 2 at C7. All screws were well-positioned and locking mechanisms were engaged. Longus coli muscles were taken down from the anterior cervical spine using electrocautery and key elevator and self-retaining retractor was placed exposing the C 45 level. The interspace was incised and a thorough discectomy was performed. Distraction pins were placed. Uncinate spurs and central spondylitic ridges were drilled down with a high-speed drill. The spinal cord dura and both C 5 nerve roots were widely decompressed. Hemostasis was assured. After trial sizing an 8 mm lordotic allograft bone wedge was selected and packed with local autograft. This was tamped  into position and countersunk appropriately. Distraction weight was removed. A 24 mm Nuvasive Archon anterior cervical plate was  affixed to the cervical spine with 13 mm variable-angle screws 2 at C4, 2 at C5. All screws were well-positioned and locking mechanisms were engaged.A final X ray was obtained which showed well positioned graft and anterior plate without complicating features. Soft tissues were inspected and found to be in good repair. The wound was irrigated. The platysma layer was closed with 3-0 Vicryl stitches and the skin was reapproximated with 3-0 Vicryl subcuticular stitches. The wound was dressed with Dermabond. Counts were correct at the end of the case. Patient was extubated and taken to recovery in stable and satisfactory condition.   PLAN OF CARE: Admit for overnight observation  PATIENT DISPOSITION:  PACU - hemodynamically stable.   Delay start of Pharmacological VTE agent (>24hrs) due to surgical blood loss or risk of bleeding: yes

## 2014-09-05 MED ORDER — HYDROCODONE-ACETAMINOPHEN 5-325 MG PO TABS: 1.0000 | ORAL_TABLET | ORAL | Status: DC | PRN

## 2014-09-05 MED ORDER — METHOCARBAMOL 500 MG PO TABS: 500.0000 mg | ORAL_TABLET | Freq: Three times a day (TID) | ORAL | Status: DC | PRN

## 2014-09-05 NOTE — Discharge Summary (Signed)
Physician Discharge Summary  Patient ID: Christina Blake MRN: [id number] DOB/AGE: September 07, 1970 44 y.o.  Admit date: 09/04/2014 Discharge date: 09/05/2014  Admission Diagnoses: cervical spondylosis   Discharge Diagnoses: same   Discharged Condition: good  Hospital Course: The patient was admitted on 09/04/2014 and taken to the operating room where the patient underwent ACDF. The patient tolerated the procedure well and was taken to the recovery room and then to the floor in stable condition. The hospital course was routine. There were no complications. The wound remained clean dry and intact. Pt had appropriate neck soreness. No complaints of arm pain or new N/T/W. The patient remained afebrile with stable vital signs, and tolerated a regular diet. The patient continued to increase activities, and pain was well controlled with oral pain medications.   Consults: None  Significant Diagnostic Studies:  Results for orders placed or performed during the hospital encounter of 08/28/14  Surgical pcr screen  Result Value Ref Range   MRSA, PCR NEGATIVE NEGATIVE   Staphylococcus aureus NEGATIVE NEGATIVE  hCG, serum, qualitative  Result Value Ref Range   Preg, Serum NEGATIVE NEGATIVE  Basic metabolic panel  Result Value Ref Range   Sodium 134 (L) 135 - 145 mmol/L   Potassium 4.6 3.5 - 5.1 mmol/L   Chloride 108 96 - 112 mmol/L   CO2 21 19 - 32 mmol/L   Glucose, Bld 88 70 - 99 mg/dL   BUN 12 6 - 23 mg/dL   Creatinine, Ser 0.74 0.50 - 1.10 mg/dL   Calcium 9.3 8.4 - 10.5 mg/dL   GFR calc non Af Amer >90 >90 mL/min   GFR calc Af Amer >90 >90 mL/min   Anion gap 5 5 - 15  CBC  Result Value Ref Range   WBC 7.9 4.0 - 10.5 K/uL   RBC 4.54 3.87 - 5.11 MIL/uL   Hemoglobin 14.0 12.0 - 15.0 g/dL   HCT 41.1 36.0 - 46.0 %   MCV 90.5 78.0 - 100.0 fL   MCH 30.8 26.0 - 34.0 pg   MCHC 34.1 30.0 - 36.0 g/dL   RDW 12.9 11.5 - 15.5 %   Platelets 173 150 - 400 K/uL    Dg Cervical Spine 2-3  Views  09/04/2014   CLINICAL DATA:  ACDF C4-C5 and C6-C7  EXAM: CERVICAL SPINE - 2-3 VIEW  COMPARISON:  08/04/2014  FINDINGS: Two lateral views of the cervical spine submitted. On the first view metallic plate at C5 C6 level has been removed. A localization needle is noted at C4-C5 disc level and a second localization needle at C6-C7 disc level.  On the second lateral view there is anterior metallic fixation plate and screws at I0-X7 level. Partial visualized metallic fixation plate and screw at C6 level.  IMPRESSION: On the first view metallic plate at C5 C6 level has been removed. A localization needle is noted at C4-C5 disc level and a second localization needle at C6-C7 disc level. On the second lateral view there is anterior metallic fixation plate and screws at D5-H2 level. Partial visualized metallic fixation plate and screw at C6 level.   Electronically Signed   By: Lahoma Crocker M.D.   On: 09/04/2014 13:22    Antibiotics:  Anti-infectives    Start     Dose/Rate Route Frequency Ordered Stop   09/04/14 1830  ceFAZolin (ANCEF) IVPB 1 g/50 mL premix     1 g 100 mL/hr over 30 Minutes Intravenous Every 8 hours 09/04/14 1533 09/05/14 0228  09/04/14 0600  ceFAZolin (ANCEF) IVPB 2 g/50 mL premix     2 g 100 mL/hr over 30 Minutes Intravenous On call to O.R. 09/03/14 1400 09/04/14 1030      Discharge Exam: Blood pressure 101/63, pulse 92, temperature 98.2 F (36.8 C), temperature source Oral, resp. rate 16, weight 161 lb (73.029 kg), last menstrual period 09/01/2014, SpO2 98 %. Neurologic: Grossly normal incison CDI  Discharge Medications:     Medication List    STOP taking these medications        diclofenac 75 MG EC tablet  Commonly known as:  VOLTAREN      TAKE these medications        ALPRAZolam 0.5 MG tablet  Commonly known as:  XANAX  Take 0.5 mg by mouth 3 (three) times daily as needed for anxiety.     BLACK COHOSH PO  Take 1 tablet by mouth daily. At bedtime     calcium  carbonate 600 MG Tabs tablet  Commonly known as:  OS-CAL  Take 600 mg by mouth daily.     gabapentin 100 MG capsule  Commonly known as:  NEURONTIN  Take 1 capsule (100 mg total) by mouth 3 (three) times daily.     HYDROcodone-acetaminophen 5-325 MG per tablet  Commonly known as:  NORCO/VICODIN  Take 1-2 tablets by mouth every 4 (four) hours as needed (mild pain).     lamoTRIgine 200 MG tablet  Commonly known as:  LAMICTAL  Take 200 mg by mouth at bedtime.     levofloxacin 500 MG tablet  Commonly known as:  LEVAQUIN  Take 1 tablet (500 mg total) by mouth daily.     lidocaine 5 %  Commonly known as:  LIDODERM  Place 1 patch onto the skin daily. Remove & Discard patch within 12 hours or as directed by MD     methocarbamol 500 MG tablet  Commonly known as:  ROBAXIN  Take 1 tablet (500 mg total) by mouth every 8 (eight) hours as needed for muscle spasms.     oxyCODONE-acetaminophen 10-325 MG per tablet  Commonly known as:  PERCOCET  Take 1 tablet by mouth every 4 (four) hours as needed for pain.     PARoxetine 25 MG 24 hr tablet  Commonly known as:  PAXIL-CR  Take 50 mg by mouth every evening.     promethazine 25 MG tablet  Commonly known as:  PHENERGAN  Take 1 tablet (25 mg total) by mouth every 8 (eight) hours as needed for nausea.     temazepam 15 MG capsule  Commonly known as:  RESTORIL  Take 15 mg by mouth at bedtime as needed for sleep.     traZODone 100 MG tablet  Commonly known as:  DESYREL  Take 100 mg by mouth at bedtime.     valACYclovir 1000 MG tablet  Commonly known as:  VALTREX  Two tablets now, and then take two tablets 12 hrs later        Disposition: home   Final Dx: ACDF      Discharge Instructions    Call MD for:  difficulty breathing, headache or visual disturbances    Complete by:  As directed      Call MD for:  persistant nausea and vomiting    Complete by:  As directed      Call MD for:  redness, tenderness, or signs of infection  (pain, swelling, redness, odor or green/yellow discharge around incision site)  Complete by:  As directed      Call MD for:  severe uncontrolled pain    Complete by:  As directed      Call MD for:  temperature >100.4    Complete by:  As directed      Diet - low sodium heart healthy    Complete by:  As directed      Discharge instructions    Complete by:  As directed   May shower, no strenuous activity     Increase activity slowly    Complete by:  As directed               Signed: Kida Digiulio S 09/05/2014, 9:45 AM

## 2014-09-05 NOTE — Progress Notes (Signed)
OT Cancellation Note  Patient Details Name: Christina Blake MRN: [id number] DOB: 03/23/1971   Cancelled Treatment:    Reason Eval/Treat Not Completed: Patient declined, no reason specified. Pt is declining all therapy at this time. Acute OT to sign off.   Villa Herb M   Cyndie Chime, OTR/L Occupational Therapist [phone] (pager)  09/05/2014, 7:46 AM

## 2014-09-05 NOTE — Progress Notes (Signed)
Pt doing well. Pt and sister given D/C instructions with Rx's, verbal understanding was provided. Pt's incision is open to air with no sign of infection. Pt's IV was removed prior to D/C. Pt D/C'd home via walking @ 1030 per MD order. Pt is stable @ D/C and has no other needs at this time. Holli Humbles, RN

## 2014-09-05 NOTE — Progress Notes (Signed)
PT Cancellation Note  Patient Details Name: Christina Blake MRN: [id number] DOB: 09/30/1970   Cancelled Treatment:    Reason Eval/Treat Not Completed: Other (comment). Spoke with nursing, pt refusing all therapies at this time. Will sign off.    Elie Confer [place], [place] 09/05/2014, 7:51 AM

## 2014-09-06 NOTE — Care Management Note (Signed)
    Page 1 of 1   09/06/2014     9:05:21 AM CARE MANAGEMENT NOTE 09/06/2014  Patient:  Christina Blake, Christina Blake   Account Number:  [id number]  Date Initiated:  09/05/2014  Documentation initiated by:  [facility]  Subjective/Objective Assessment:     Action/Plan:   discharge planning   Anticipated DC Date:  09/05/2014   Anticipated DC Plan:  [place]  CM consult      Choice offered to / List presented to:             Status of service:   Medicare Important Message given?   (If response is "NO", the following Medicare IM given date fields will be blank) Date Medicare IM given:   Medicare IM given by:   Date Additional Medicare IM given:   Additional Medicare IM given by:    Discharge Disposition:  HOME/SELF CARE  Per UR Regulation:    If discussed at Long Length of Stay Meetings, dates discussed:    Comments:  09/04/13 CM notes pt refusing all therapies.  No other Cmneeds were communicated.  Mariane Masters, BSn, [place].

## 2014-09-07 ENCOUNTER — Encounter (HOSPITAL_COMMUNITY): Payer: Self-pay | Admitting: Neurosurgery

## 2014-10-06 ENCOUNTER — Ambulatory Visit: Payer: BLUE CROSS/BLUE SHIELD | Admitting: Family Medicine

## 2014-11-18 ENCOUNTER — Encounter: Payer: Self-pay | Admitting: Family Medicine

## 2014-11-18 ENCOUNTER — Ambulatory Visit (INDEPENDENT_AMBULATORY_CARE_PROVIDER_SITE_OTHER): Payer: BLUE CROSS/BLUE SHIELD | Admitting: Family Medicine

## 2014-11-18 VITALS — BP 102/74 | Temp 99.2°F | Ht 67.0 in | Wt 154.4 lb

## 2014-11-18 DIAGNOSIS — R509 Fever, unspecified: Secondary | ICD-10-CM

## 2014-11-18 DIAGNOSIS — J329 Chronic sinusitis, unspecified: Secondary | ICD-10-CM | POA: Diagnosis not present

## 2014-11-18 MED ORDER — CEFTRIAXONE SODIUM 1 G IJ SOLR
500.0000 mg | Freq: Once | INTRAMUSCULAR | Status: AC
  Administered 2014-11-18: 500 mg via INTRAMUSCULAR

## 2014-11-18 MED ORDER — CLARITHROMYCIN 500 MG PO TABS: 500.0000 mg | ORAL_TABLET | Freq: Two times a day (BID) | ORAL | Status: DC

## 2014-11-18 MED ORDER — VALACYCLOVIR HCL 1 G PO TABS
ORAL_TABLET | ORAL | Status: DC
Start: 2014-11-18 — End: 2015-02-27

## 2014-11-18 MED ORDER — LIDOCAINE VISCOUS 2 % MT SOLN: OROMUCOSAL | Status: DC

## 2014-11-18 NOTE — Progress Notes (Signed)
   Subjective:    Patient ID: Christina Blake, female    DOB: 29-Oct-1970, 44 y.o.   MRN: [id number]  HPI  Patient here for sore throat, fever, headache, body aches, and ear pain times 2 days.   Patient requesting another rx for valtrex.   Low-grade fever intermittently.  Headache frontal in nature worse with change of position.  Cough productive at times.  Review of Systems No rash currently no high fevers no chills positive insomnia positive fatigue    Objective:   Physical Exam Alert vitals stable mild malaise. HEENT moderate his congestion frontal tenderness pharynx normal neck supple. Lungs clear. Heart regular in rhythm.       Assessment & Plan:  Impression 1 acute rhinosinusitis plan antibiotics prescribed. Symptom care discussed warning signs discussed WSL

## 2015-02-25 ENCOUNTER — Encounter: Payer: Self-pay | Admitting: Nurse Practitioner

## 2015-02-25 ENCOUNTER — Ambulatory Visit (INDEPENDENT_AMBULATORY_CARE_PROVIDER_SITE_OTHER): Payer: BLUE CROSS/BLUE SHIELD | Admitting: Nurse Practitioner

## 2015-02-25 ENCOUNTER — Encounter: Payer: Self-pay | Admitting: Family Medicine

## 2015-02-25 VITALS — BP 112/76 | Temp 98.7°F | Ht 67.0 in | Wt 149.0 lb

## 2015-02-25 DIAGNOSIS — R5383 Other fatigue: Secondary | ICD-10-CM

## 2015-02-25 DIAGNOSIS — Z13220 Encounter for screening for lipoid disorders: Secondary | ICD-10-CM | POA: Diagnosis not present

## 2015-02-25 DIAGNOSIS — Z0000 Encounter for general adult medical examination without abnormal findings: Secondary | ICD-10-CM

## 2015-02-25 DIAGNOSIS — Z01419 Encounter for gynecological examination (general) (routine) without abnormal findings: Secondary | ICD-10-CM

## 2015-02-25 DIAGNOSIS — B9689 Other specified bacterial agents as the cause of diseases classified elsewhere: Secondary | ICD-10-CM

## 2015-02-25 DIAGNOSIS — J069 Acute upper respiratory infection, unspecified: Secondary | ICD-10-CM

## 2015-02-25 MED ORDER — AMOXICILLIN-POT CLAVULANATE 875-125 MG PO TABS
1.0000 | ORAL_TABLET | Freq: Two times a day (BID) | ORAL | Status: DC
Start: 2015-02-25 — End: 2015-07-15

## 2015-02-27 ENCOUNTER — Encounter: Payer: Self-pay | Admitting: Nurse Practitioner

## 2015-02-27 NOTE — Progress Notes (Signed)
Subjective:    Patient ID: Christina Blake, female    DOB: [birth year], 44 y.o.   MRN: [id number]  HPI presents for her wellness exam. Same partner. Regular cycles, normal flow. Regular vision and dental care. Has mamm[facility]; normal. Active. Doing well with diet. Generalized mild malaise. Increased stress; raising her toddler grandchild. Has increased smoking to 1 1/2 ppd. Continues to get counseling.    Review of Systems  Constitutional: Negative for fever, activity change, appetite change and fatigue.  HENT: Positive for congestion. Negative for dental problem, ear pain, sinus pressure and sore throat.   Respiratory: Positive for cough. Negative for chest tightness, shortness of breath and wheezing.        Slight wheeze; producing yellow sputum.   Cardiovascular: Negative for chest pain.  Gastrointestinal: Negative for nausea, vomiting, abdominal pain, diarrhea, constipation and abdominal distention.       No acid reflux.  Genitourinary: Negative for dysuria, urgency, frequency, vaginal discharge, enuresis, difficulty urinating, genital sores, menstrual problem and pelvic pain.       Objective:   Physical Exam  Constitutional: She is oriented to person, place, and time. She appears well-developed. No distress.  HENT:  Right Ear: External ear normal.  Left Ear: External ear normal.  Mouth/Throat: Oropharynx is clear and moist.  TMs clear effusion. Pharynx injected with PND noted.   Neck: Normal range of motion. Neck supple. No tracheal deviation present. No thyromegaly present.  Cardiovascular: Normal rate, regular rhythm and normal heart sounds.  Exam reveals no gallop.   No murmur heard. Pulmonary/Chest: Effort normal and breath sounds normal.  Abdominal: Soft. She exhibits no distension. There is no tenderness.  Genitourinary: Vagina normal and uterus normal. No vaginal discharge found.  External GU: no rashes or lesions. Vagina: no discharge. Bimanual exam: no  tenderness or obvious masses.   Musculoskeletal: She exhibits no edema.  Lymphadenopathy:    She has no cervical adenopathy.  Neurological: She is alert and oriented to person, place, and time.  Skin: Skin is warm and dry. No rash noted.  Significant sun damage.   Psychiatric: She has a normal mood and affect. Her behavior is normal.  Vitals reviewed. Breast exam: no masses; axillae no adenopathy.         Assessment & Plan:  Well woman exam - Plan: Lipid panel  Other fatigue - Plan: Hepatic function panel, Basic metabolic panel, CBC with Differential/Platelet, TSH, Vit D  25 hydroxy (rtn osteoporosis monitoring)  Bacterial upper respiratory infection  Screening for lipid disorders - Plan: Lipid panel  Meds ordered this encounter  Medications  . diclofenac (VOLTAREN) 75 MG EC tablet    Sig: Take 75 mg by mouth 2 (two) times daily.  Marland Kitchen amoxicillin-clavulanate (AUGMENTIN) 875-125 MG per tablet    Sig: Take 1 tablet by mouth 2 (two) times daily.    Dispense:  20 tablet    Refill:  0    Order Specific Question:  Supervising Provider    Answer:  Mikey Kirschner [[id number]]   Meds ordered this encounter  Medications  . diclofenac (VOLTAREN) 75 MG EC tablet    Sig: Take 75 mg by mouth 2 (two) times daily.  Marland Kitchen amoxicillin-clavulanate (AUGMENTIN) 875-125 MG per tablet    Sig: Take 1 tablet by mouth 2 (two) times daily.    Dispense:  20 tablet    Refill:  0    Order Specific Question:  Supervising Provider    Answer:  Mikey Kirschner [  [id number]]   OTC as directed for congestion and cough. Call back if persists. Recommend skin cancer screening and use of sunscreen. Recommend dental exam; daily vitamin D and calcium.  Return in about 1 year (around 02/25/2016) for physical.

## 2015-03-01 LAB — CBC WITH DIFFERENTIAL/PLATELET
BASOS ABS: 0.1 10*3/uL (ref 0.0–0.2)
Basos: 1 %
EOS (ABSOLUTE): 0.2 10*3/uL (ref 0.0–0.4)
Eos: 4 %
Hematocrit: 43.9 % (ref 34.0–46.6)
Hemoglobin: 15 g/dL (ref 11.1–15.9)
IMMATURE GRANS (ABS): 0 10*3/uL (ref 0.0–0.1)
Immature Granulocytes: 0 %
Lymphocytes Absolute: 2.2 10*3/uL (ref 0.7–3.1)
Lymphs: 42 %
MCH: 30.9 pg (ref 26.6–33.0)
MCHC: 34.2 g/dL (ref 31.5–35.7)
MCV: 90 fL (ref 79–97)
Monocytes Absolute: 0.5 10*3/uL (ref 0.1–0.9)
Monocytes: 10 %
NEUTROS ABS: 2.4 10*3/uL (ref 1.4–7.0)
Neutrophils: 43 %
PLATELETS: 228 10*3/uL (ref 150–379)
RBC: 4.86 x10E6/uL (ref 3.77–5.28)
RDW: 13.4 % (ref 12.3–15.4)
WBC: 5.4 10*3/uL (ref 3.4–10.8)

## 2015-03-01 LAB — BASIC METABOLIC PANEL
BUN/Creatinine Ratio: 15 (ref 9–23)
BUN: 13 mg/dL (ref 6–24)
CO2: 19 mmol/L (ref 18–29)
Calcium: 9.6 mg/dL (ref 8.7–10.2)
Chloride: 103 mmol/L (ref 97–108)
Creatinine, Ser: 0.89 mg/dL (ref 0.57–1.00)
GFR calc non Af Amer: 79 mL/min/{1.73_m2} (ref >59)
GFR, EST AFRICAN AMERICAN: 91 mL/min/{1.73_m2} (ref >59)
GLUCOSE: 88 mg/dL (ref 65–99)
POTASSIUM: 4.5 mmol/L (ref 3.5–5.2)
Sodium: 142 mmol/L (ref 134–144)

## 2015-03-01 LAB — LIPID PANEL
CHOLESTEROL TOTAL: 179 mg/dL (ref 100–199)
Chol/HDL Ratio: 3.4 ratio units (ref 0.0–4.4)
HDL: 52 mg/dL (ref >39)
LDL CALC: 113 mg/dL — AB (ref 0–99)
TRIGLYCERIDES: 70 mg/dL (ref 0–149)
VLDL CHOLESTEROL CAL: 14 mg/dL (ref 5–40)

## 2015-03-01 LAB — HEPATIC FUNCTION PANEL
ALT: 12 IU/L (ref 0–32)
AST: 14 IU/L (ref 0–40)
Albumin: 4.4 g/dL (ref 3.5–5.5)
Alkaline Phosphatase: 94 IU/L (ref 39–117)
BILIRUBIN TOTAL: 0.3 mg/dL (ref 0.0–1.2)
Bilirubin, Direct: 0.11 mg/dL (ref 0.00–0.40)
Total Protein: 6.9 g/dL (ref 6.0–8.5)

## 2015-03-01 LAB — TSH: TSH: 1.98 u[IU]/mL (ref 0.450–4.500)

## 2015-03-01 LAB — VITAMIN D 25 HYDROXY (VIT D DEFICIENCY, FRACTURES): VIT D 25 HYDROXY: 48.5 ng/mL (ref 30.0–100.0)

## 2015-03-09 ENCOUNTER — Encounter: Payer: Self-pay | Admitting: Nurse Practitioner

## 2015-07-06 ENCOUNTER — Ambulatory Visit (INDEPENDENT_AMBULATORY_CARE_PROVIDER_SITE_OTHER): Payer: BLUE CROSS/BLUE SHIELD | Admitting: Family Medicine

## 2015-07-06 ENCOUNTER — Encounter: Payer: Self-pay | Admitting: Family Medicine

## 2015-07-06 VITALS — BP 126/82 | Ht 67.0 in | Wt 156.1 lb

## 2015-07-06 DIAGNOSIS — R079 Chest pain, unspecified: Secondary | ICD-10-CM | POA: Diagnosis not present

## 2015-07-06 NOTE — Progress Notes (Signed)
   Subjective:    Patient ID: Christina Blake, female    DOB: [birth year], 45 y.o.   MRN: [id number]  Chest Pain  This is a new problem. The current episode started in the past 7 days. The problem occurs daily. The pain does not radiate. Associated symptoms include palpitations. She has tried rest for the symptoms.     CAN OCCUR WHEN SITTING   NO SIG ASSOC WITH EXERCISE AT ALL , OCCURING WITH REST USUAL .Marland Kitchen  FEELING PALPITATIONS NOT TRUE SKIPPING . Patient has ongoing stress though no more than usual.  Struggling with depression on multiple medications at this time.  Chest pain is deep substernal sometimes associated shortness breath sometimes palpitations radiates towards the back.  Also chronic neck pain with history of multiple disc surgeries next  Concerned because of strong family history of coronary artery disease  Also patient smokes unfortunately  Dr little  [place] [facility] [address] [place], hx of cad   Do smoke   Review of Systems  Cardiovascular: Positive for chest pain and palpitations.   no abdominal pain no headache no shortness of breath     Objective:   Physical Exam  Alert no acute distress lungs clear heart rare rhythm HET normal chest wall no obvious pain X  EKG normal sinus rhythm no significant ST-T changes      Assessment & Plan:  Impression atypical chest pain with palpitations in patient with some risk factors. With patient's age and smoking history need to respect Plan cardiology referral. Rationale discussed.

## 2015-07-07 ENCOUNTER — Encounter: Payer: Self-pay | Admitting: Family Medicine

## 2015-07-15 ENCOUNTER — Encounter: Payer: Self-pay | Admitting: *Deleted

## 2015-07-15 ENCOUNTER — Encounter: Payer: Self-pay | Admitting: Cardiology

## 2015-07-15 ENCOUNTER — Ambulatory Visit (INDEPENDENT_AMBULATORY_CARE_PROVIDER_SITE_OTHER): Payer: BLUE CROSS/BLUE SHIELD | Admitting: Cardiology

## 2015-07-15 ENCOUNTER — Ambulatory Visit: Payer: Self-pay | Admitting: Cardiology

## 2015-07-15 VITALS — BP 118/74 | HR 68 | Ht 67.0 in | Wt 150.0 lb

## 2015-07-15 DIAGNOSIS — E785 Hyperlipidemia, unspecified: Secondary | ICD-10-CM

## 2015-07-15 DIAGNOSIS — R002 Palpitations: Secondary | ICD-10-CM

## 2015-07-15 DIAGNOSIS — Z8249 Family history of ischemic heart disease and other diseases of the circulatory system: Secondary | ICD-10-CM

## 2015-07-15 DIAGNOSIS — R072 Precordial pain: Secondary | ICD-10-CM | POA: Diagnosis not present

## 2015-07-15 DIAGNOSIS — Z720 Tobacco use: Secondary | ICD-10-CM

## 2015-07-15 NOTE — Progress Notes (Signed)
Cardiology Office Note  Date: 07/15/2015   ID: Christina Blake, Christina Blake [birth year], MRN [id number]  PCP: Mickie Hillier, MD  Consulting Cardiologist: Rozann Lesches, MD   Chief Complaint  Patient presents with  . Chest Pain    History of Present Illness: Christina Blake is a 45 y.o. female referred for cardiology consultation by Dr. Mickie Hillier. She presents for further evaluation of intermittent palpitations and chest tightness. She states that symptoms have been present over the last 3 weeks. She describes both a sense of rapid forceful heartbeat that occurs for a few seconds without known precipitant, and not necessarily associated with this a recurring feeling of chest tightness that is also fairly brief and nonexertional. She has wondered whether stress has been involved. At no point has she had lightheadedness or syncope. She denies orthopnea or PND.  Family history includes premature CAD on her mother's side of the family. She also has a history of hyperlipidemia, last LDL was 113 back in August 2016. She does not have any prior diagnosis of ischemic heart disease or arrhythmia, underwent evaluation by Dr. Rex Kras several years ago, I do not have details of her cardiac testing however.  I reviewed her recent ECG showing sinus rhythm with nonspecific T-wave changes.   We discussed her medications which are outlined below, she denies any recent changes or additions.  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  Past Medical History  Diagnosis Date  . Hyperlipidemia   . Pulmonary embolism, bilateral ([place]) 2012  . Anxiety   . Depression   . Orthostatic hypotension   . History of agoraphobia 2007    Past Surgical History  Procedure Laterality Date  . Osteotomy      Left foot 2nd toe-[facility]  . Uterine ablation    . L4,l5 & s1 fusion  07/23/12  . C5 & c6 fusion  07/25/12  . Back surgery    . Anterior cervical decomp/discectomy fusion N/A 09/04/2014   Procedure: Cervical four-five Cervical six-seven Anterior cervical decompression/diskectomy,fusion with exploration of Cervical five-six Fusion (Removal of Helix plate);  Surgeon: Erline Levine, MD;  Location: [facility];  Service: Neurosurgery;  Laterality: N/A;    Current Outpatient Prescriptions  Medication Sig Dispense Refill  . ALPRAZolam (XANAX) 0.5 MG tablet Take 0.5 mg by mouth 3 (three) times daily as needed for anxiety.    Marland Kitchen BLACK COHOSH PO Take 1 tablet by mouth daily. At bedtime    . Calcium Citrate-Vitamin D (CALCIUM + D PO) Take by mouth.    . diclofenac (VOLTAREN) 75 MG EC tablet Take 75 mg by mouth as needed.     . dimenhyDRINATE (DRAMAMINE) 50 MG tablet Take 25 mg by mouth as needed.    . lamoTRIgine (LAMICTAL) 200 MG tablet Take 200 mg by mouth at bedtime.     Marland Kitchen lurasidone (LATUDA) 40 MG TABS tablet Take 40 mg by mouth daily with breakfast.    . methocarbamol (ROBAXIN) 500 MG tablet Take 1 tablet (500 mg total) by mouth every 8 (eight) hours as needed for muscle spasms. 60 tablet 0  . Multiple Vitamin (MULTIVITAMIN) capsule Take 1 capsule by mouth daily.    Marland Kitchen oxyCODONE-acetaminophen (PERCOCET) 10-325 MG per tablet Take 1 tablet by mouth every 4 (four) hours as needed for pain.     Marland Kitchen PARoxetine (PAXIL-CR) 37.5 MG 24 hr tablet Take 75 mg by mouth daily.    . temazepam (RESTORIL) 15 MG capsule  Take 15 mg by mouth at bedtime as needed for sleep.    . traZODone (DESYREL) 100 MG tablet Take 100 mg by mouth at bedtime.      No current facility-administered medications for this visit.   Allergies:  Review of patient's allergies indicates no known allergies.   Social History: The patient  reports that she has been smoking Cigarettes.  She has a 20 pack-year smoking history. She has never used smokeless tobacco. She reports that she does not drink alcohol or use illicit drugs.   Family History: The patient's family history includes Breast cancer in her cousin and maternal aunt;  Heart disease (age of onset: 77) in her mother; Hypertension in her mother; Osteoporosis in her mother. There is no history of Anesthesia problems, Hypotension, Malignant hyperthermia, or Pseudochol deficiency.   ROS:  Please see the history of present illness. Otherwise, complete review of systems is positive for none.  All other systems are reviewed and negative.   Physical Exam: VS:  BP 118/74 mmHg  Pulse 68  Ht 5\' 7"  (1.702 m)  Wt 150 lb (68.04 kg)  BMI 23.49 kg/m2  SpO2 98%, BMI Body mass index is 23.49 kg/(m^2).  Wt Readings from Last 3 Encounters:  07/15/15 150 lb (68.04 kg)  07/06/15 156 lb 2 oz (70.818 kg)  02/25/15 149 lb (67.586 kg)    General: Thin woman, appears comfortable at rest. HEENT: Conjunctiva and lids normal, oropharynx clear. Neck: Supple, no elevated JVP or carotid bruits, no thyromegaly. Lungs: Clear to auscultation, nonlabored breathing at rest. Cardiac: Regular rate and rhythm, no S3 or click, no significant systolic murmur, no pericardial rub. Abdomen: Soft, nontender, bowel sounds present, no guarding or rebound. Extremities: No pitting edema, distal pulses 2+. Skin: Warm and dry. Musculoskeletal: No kyphosis. Neuropsychiatric: Alert and oriented x3, affect grossly appropriate.  ECG: Tracing from 07/06/2015 showed normal sinus rhythm with nonspecific T-wave changes.  Recent Labwork: 08/28/2014: Hemoglobin 14.0 02/27/2015: ALT 12; AST 14; BUN 13; Creatinine, Ser 0.89; Platelets 228; Potassium 4.5; Sodium 142; TSH 1.980     Component Value Date/Time   CHOL 179 02/27/2015 0805   TRIG 70 02/27/2015 0805   HDL 52 02/27/2015 0805   CHOLHDL 3.4 02/27/2015 0805   LDLCALC 113* 02/27/2015 0805    Other Studies Reviewed Today:  Echocardiogram 08/15/2004: RESULTS: 1. Technically adequate echocardiographic study. 2. Normal left atrium, right atrium and right ventricle. 3. Normal aortic, mitral, tricuspid and pulmonic valves; normal proximal   pulmonary artery. 4. Normal internal dimension, wall thickness, regional and global function  of the left ventricle. 5. Normal IVC. 6. Normal Doppler study.  Assessment and Plan:  1. Recent history of recurring palpitations and chest tightness, fairly atypical features overall, no associated syncope. Recent ECG showed nonspecific T-wave changes. She does have a history of hyperlipidemia and also family history of premature CAD on her mother's side of the family. Also active tobacco use. Plan is to obtain an exercise echocardiogram for further evaluation - she states that she would like to have this done in March. We will accommodate, however if symptoms escalate in the meanwhile I have recommended that she proceed with testing sooner.  2. History of hyperlipidemia, LDL was 113 last year. She is presently not on specific medical therapy, working on diet.  3. Tobacco use. Smoking cessation would be beneficial particular from perspective of reducing cardiac risk over time. Would continue to work on this with Dr. Wolfgang Phoenix.  Current medicines were reviewed with the patient  today.   Orders Placed This Encounter  Procedures  . ECHO STRESS TEST    Disposition: Call with results.   Signed, Satira Sark, MD, [facility] 07/15/2015 11:07 AM    [facility] [place], [place], [place] [address] Phone: [phone]; Fax: [phone]

## 2015-07-15 NOTE — Patient Instructions (Signed)
Your physician recommends that you continue on your current medications as directed. Please refer to the Current Medication list given to you today. Your physician has requested that you have a stress echocardiogram. For further information please visit [email or web address]. Please follow instruction sheet as given. We will call you with your results.  

## 2015-08-12 ENCOUNTER — Ambulatory Visit: Payer: Self-pay | Admitting: Cardiology

## 2015-08-13 ENCOUNTER — Ambulatory Visit: Payer: Self-pay | Admitting: Cardiology

## 2015-09-17 ENCOUNTER — Other Ambulatory Visit (HOSPITAL_COMMUNITY): Payer: Self-pay

## 2016-02-10 ENCOUNTER — Telehealth: Payer: Self-pay | Admitting: *Deleted

## 2016-02-14 NOTE — Telephone Encounter (Signed)
Spoke with patient and she said that she was not having any more symptoms and has decided to not have stress test done.

## 2016-05-01 ENCOUNTER — Ambulatory Visit (INDEPENDENT_AMBULATORY_CARE_PROVIDER_SITE_OTHER): Payer: BLUE CROSS/BLUE SHIELD | Admitting: Family Medicine

## 2016-05-01 ENCOUNTER — Encounter: Payer: Self-pay | Admitting: Family Medicine

## 2016-05-01 VITALS — BP 118/78 | Temp 98.3°F | Ht 67.0 in | Wt 178.6 lb

## 2016-05-01 DIAGNOSIS — B9689 Other specified bacterial agents as the cause of diseases classified elsewhere: Secondary | ICD-10-CM

## 2016-05-01 DIAGNOSIS — J019 Acute sinusitis, unspecified: Secondary | ICD-10-CM

## 2016-05-01 MED ORDER — OXYCODONE-ACETAMINOPHEN 10-325 MG PO TABS: 1.0000 | ORAL_TABLET | Freq: Three times a day (TID) | ORAL | 0 refills | Status: DC | PRN

## 2016-05-01 MED ORDER — AMOXICILLIN-POT CLAVULANATE 875-125 MG PO TABS: 1.0000 | ORAL_TABLET | Freq: Two times a day (BID) | ORAL | 0 refills | Status: DC

## 2016-05-01 NOTE — Progress Notes (Signed)
   Subjective:    Patient ID: Christina Blake, female    DOB: October 14, 1970, 45 y.o.   MRN: [id number]  Cough  This is a new problem. The current episode started 1 to 4 weeks ago. Associated symptoms include a fever, headaches, myalgias, nasal congestion and a sore throat. Treatments tried: tylenol pm.   Patient relates approximate 5-6 days of head congestion drainage sinus pressure not feeling good denies high fever chills does relate having some sweats and mild fever   Review of Systems  Constitutional: Positive for fever.  HENT: Positive for sore throat.   Respiratory: Positive for cough.   Musculoskeletal: Positive for myalgias.  Neurological: Positive for headaches.       Objective:   Physical Exam  Constitutional: She appears well-developed.  HENT:  Head: Normocephalic.  Nose: Nose normal.  Mouth/Throat: Oropharynx is clear and moist. No oropharyngeal exudate.  Neck: Neck supple.  Cardiovascular: Normal rate and normal heart sounds.   No murmur heard. Pulmonary/Chest: Effort normal and breath sounds normal. She has no wheezes.  Lymphadenopathy:    She has no cervical adenopathy.  Skin: Skin is warm and dry.  Nursing note and vitals reviewed.  Patient does not appear toxic moderate sinus tenderness       Assessment & Plan:  I do not find any evidence of bacteremia or meningitis Sinusitis Augmentin prescribed Patient and take the next several days off from work If progressive symptoms occur she she will need to follow-up. No need for lab work currently

## 2016-08-09 DIAGNOSIS — F3181 Bipolar II disorder: Secondary | ICD-10-CM | POA: Diagnosis not present

## 2016-08-09 DIAGNOSIS — F411 Generalized anxiety disorder: Secondary | ICD-10-CM | POA: Diagnosis not present

## 2016-08-18 ENCOUNTER — Encounter: Payer: Self-pay | Admitting: Family Medicine

## 2016-08-18 ENCOUNTER — Ambulatory Visit (INDEPENDENT_AMBULATORY_CARE_PROVIDER_SITE_OTHER): Payer: BLUE CROSS/BLUE SHIELD | Admitting: Family Medicine

## 2016-08-18 VITALS — BP 128/86 | Temp 98.3°F | Ht 67.0 in | Wt 186.0 lb

## 2016-08-18 DIAGNOSIS — J111 Influenza due to unidentified influenza virus with other respiratory manifestations: Secondary | ICD-10-CM | POA: Diagnosis not present

## 2016-08-18 DIAGNOSIS — B9689 Other specified bacterial agents as the cause of diseases classified elsewhere: Secondary | ICD-10-CM | POA: Diagnosis not present

## 2016-08-18 DIAGNOSIS — J019 Acute sinusitis, unspecified: Secondary | ICD-10-CM

## 2016-08-18 MED ORDER — OSELTAMIVIR PHOSPHATE 75 MG PO CAPS: 75.0000 mg | ORAL_CAPSULE | Freq: Two times a day (BID) | ORAL | 0 refills | Status: AC

## 2016-08-18 MED ORDER — AMOXICILLIN-POT CLAVULANATE 875-125 MG PO TABS: 1.0000 | ORAL_TABLET | Freq: Two times a day (BID) | ORAL | 0 refills | Status: AC

## 2016-08-18 NOTE — Progress Notes (Signed)
   Subjective:    Patient ID: Christina Blake, female    DOB: 02-12-1971, 46 y.o.   MRN: [id number]  Sinusitis  This is a new problem. Episode onset: 2 days ago. Associated symptoms include coughing, headaches and sinus pressure. (Low grade fever) Past treatments include acetaminophen.   Started wed  Felt heaache soretthroat  Felt fever immediately  Energy level appetitie low   Nos major aches elsewhere   no flu shot this yr     Review of Systems  HENT: Positive for sinus pressure.   Respiratory: Positive for cough.   Neurological: Positive for headaches.       Objective:   Physical Exam Alert vitals reviewed, moderate malaise. Hydration good. Positive nasal congestion lungs no crackles or wheezes, no tachypnea, intermittent bronchial cough during exam heart regular rate and rhythm.        Assessment & Plan:  Impression influenza discussed at length/Potential rhinosinusitis Petra Kuba of illness and potential sequela discussed. Plan Tamiflu prescribed if indicated and timing appropriate. Symptom care discussed. Warning signs discussed. WSL

## 2016-09-04 DIAGNOSIS — Z79899 Other long term (current) drug therapy: Secondary | ICD-10-CM | POA: Diagnosis not present

## 2016-10-30 ENCOUNTER — Ambulatory Visit (HOSPITAL_COMMUNITY)
Admission: RE | Admit: 2016-10-30 | Discharge: 2016-10-30 | Disposition: A | Discharge status: Home / Self Care | Payer: BLUE CROSS/BLUE SHIELD | Attending: Psychiatry | Admitting: Psychiatry

## 2016-10-30 DIAGNOSIS — F329 Major depressive disorder, single episode, unspecified: Secondary | ICD-10-CM | POA: Insufficient documentation

## 2016-10-30 NOTE — H&P (Signed)
Behavioral Health Medical Screening Exam  Christina Blake is an 46 y.o. female who presents as a walk in requesting help for increased anxiety/depression that appears to be driven by external stressors. Patient reports increased workload related to her job and caring for her 30 year old grandchild. She is seen at [facility] and [facility] for medication management. Marcie Mowers counselor assisted patient is getting a sooner appointment with Wells Guiles therapist at [facility]. A request was also made for the patient to see her medication provider sooner. At time of assessment the patient denies any thoughts of self harm but stated "I just wish I could get a break from it all. I need a few days away. I don't want to hurt myself nor have I ever tried."   Total Time spent with patient: 20 minutes  Psychiatric Specialty Exam: Physical Exam  Constitutional: She is oriented to person, place, and time. She appears well-developed and well-nourished.  HENT:  Head: Normocephalic and atraumatic.  Right Ear: External ear normal.  Left Ear: External ear normal.  Mouth/Throat: Oropharynx is clear and moist.  Eyes: Conjunctivae are normal. Pupils are equal, round, and reactive to light.  Neck: Normal range of motion. Neck supple.  Cardiovascular: Normal rate, regular rhythm, normal heart sounds and intact distal pulses.   Respiratory: Effort normal and breath sounds normal.  GI: Soft. Bowel sounds are normal.  Musculoskeletal: Normal range of motion.  Neurological: She is alert and oriented to person, place, and time.  Skin: Skin is warm and dry.    Review of Systems  Constitutional: Negative for chills, fever, malaise/fatigue and weight loss.  HENT: Negative for hearing loss and tinnitus.   Respiratory: Negative for cough, hemoptysis, sputum production and shortness of breath.   Cardiovascular: Negative for chest pain, palpitations, orthopnea and claudication.  Gastrointestinal:  Negative for abdominal pain, diarrhea, heartburn, nausea and vomiting.  Genitourinary: Negative for dysuria and urgency.  Musculoskeletal: Negative for back pain, myalgias and neck pain.  Skin: Negative for itching and rash.  Neurological: Negative for dizziness, tingling, tremors, sensory change and headaches.  Psychiatric/Behavioral: Positive for depression. Negative for hallucinations, memory loss, substance abuse and suicidal ideas. The patient is nervous/anxious. The patient does not have insomnia.     Blood pressure 129/87, pulse 75, temperature 98.5 F (36.9 C), resp. rate 18.There is no height or weight on file to calculate BMI.  General Appearance: Casual  Eye Contact:  Good  Speech:  Clear and Coherent  Volume:  Normal  Mood:  Anxious  Affect:  Depressed  Thought Process:  Coherent and Goal Directed  Orientation:  Full (Time, Place, and Person)  Thought Content:  Symptoms, worries, concerns   Suicidal Thoughts:  Yes.  without intent/plan  Homicidal Thoughts:  No  Memory:  Immediate;   Good Recent;   Good Remote;   Good  Judgement:  Fair  Insight:  Present  Psychomotor Activity:  Normal  Concentration: Concentration: Good and Attention Span: Good  Recall:  Good  Fund of Knowledge:Good  Language: Good  Akathisia:  No  Handed:  Right  AIMS (if indicated):     Assets:  Communication Skills Desire for Improvement Financial Resources/Insurance Housing Intimacy Leisure Time [place] Talents/Skills Transportation Vocational/Educational  Sleep:       Musculoskeletal: Strength & Muscle Tone: within normal limits Gait & Station: normal Patient leans: N/A  Blood pressure 129/87, pulse 75, temperature 98.5 F (36.9 C), resp. rate 18.  Recommendations:  Based  on my evaluation the patient does not appear to have an emergency medical condition.  [name], NP 10/30/2016, 3:50 PM

## 2016-10-30 NOTE — BH Assessment (Signed)
Tele Assessment Note   Christina Blake is an 46 y.o. female presenting with depressed mood, reports of persistent anxiety, multiple stressors and suicidal ideation. The patient is seen by Ala Bent at [facility] for medication management, and in the past seen by Wells Guiles a therapist there. The patient reports being overwhelmed and under a lot of pressure at home and on the job. She is raising her 16 year old granddaughter, her husband is on disability due to a medication issue and the patient works full time as a Marine scientist. 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 Reports she had a "melt down" today, crying this morning.  Reports wanting to disappear and have another life. Expressed desire for escapism but no plan or intent. Denies HI or A/V. Denies SA.  The patient did report her suicide attempts in her family. Her father made x2 SI attempts in his life but died of natural causes in 08/24/2022 of this year.   Several options were discussed with the patient. She felt she was unable to take off work for IOP and felt this would just add to her stress. She was interested in this clinician calling [facility] to see if she could get an appointment with therapist Wells Guiles or move up her appt with Bayard Beaver currently scheduled for May 29th, 2018. The patient signed a consent to release form.   I spoke with Cecille Rubin at [facility] who confirmed she had an appointment for tomorrow at [place] with Wells Guiles.  The patient affirmed her willingness and ability to make this appointment.   [name], NP recommends outpatient to [facility]  Diagnosis: Major depressive disorder, recurrent episode, without psychotic features  Past Medical History:  Past Medical History:  Diagnosis Date  . Anxiety   . Depression   . History of agoraphobia 2007  .  Hyperlipidemia   . Orthostatic hypotension   . Pulmonary embolism, bilateral ([place]) 2012    Past Surgical History:  Procedure Laterality Date  . ANTERIOR CERVICAL DECOMP/DISCECTOMY FUSION N/A 09/04/2014   Procedure: Cervical four-five Cervical six-seven Anterior cervical decompression/diskectomy,fusion with exploration of Cervical five-six Fusion (Removal of Helix plate);  Surgeon: Erline Levine, MD;  Location: [facility];  Service: Neurosurgery;  Laterality: N/A;  . BACK SURGERY    . C5 & C6 fusion  07/25/12  . L4,L5 & S1 fusion  07/23/12  . OSTEOTOMY     Left foot 2nd toe-[facility]  . Uterine ablation      Family History:  Family History  Problem Relation Age of Onset  . Anesthesia problems Neg Hx   . Hypotension Neg Hx   . Malignant hyperthermia Neg Hx   . Pseudochol deficiency Neg Hx   . Hypertension Mother   . Heart disease Mother 70    MI at 39   . Osteoporosis Mother   . Breast cancer Maternal Aunt   . Breast cancer Cousin     Maternal first cousin    Social History:  reports that she has been smoking Cigarettes.  She has a 20.00 pack-year smoking history. She has never used smokeless tobacco. She reports that she does not drink alcohol or use drugs.  Additional Social History:  Alcohol / Drug Use Pain Medications: see MAR Prescriptions: see MAR Over the Counter: see MAR History of alcohol / drug use?: No  history of alcohol / drug abuse  CIWA: CIWA-Ar BP: 129/87 Pulse Rate: 75 COWS:    PATIENT STRENGTHS: (choose at least two) Average or above average intelligence General fund of knowledge  Allergies: No Known Allergies  Home Medications:  (Not in a hospital admission)  OB/GYN Status:  No LMP recorded.  General Assessment Data Location of Assessment: [facility] Assessment Services TTS Assessment: In system Is this a Tele or Face-to-Face Assessment?: Face-to-Face Is this an Initial Assessment or a Re-assessment for this encounter?: Initial Assessment Marital status:  Married Is patient pregnant?: No Pregnancy Status: No Living Arrangements: Spouse/significant other, Other relatives Can pt return to current living arrangement?: Yes Admission Status: Voluntary Is patient capable of signing voluntary admission?: Yes Referral Source: Self/Family/Friend Insurance type: Insurance risk surveyor Exam ([employer]) Medical Exam completed: Yes  Crisis Care Plan Living Arrangements: Spouse/significant other, Other relatives Name of Psychiatrist: `Lisa Polus Name of Therapist: Wells Guiles  Education Status Is patient currently in school?: No  Risk to self with the past 6 months Suicidal Ideation: Yes-Currently Present Has patient been a risk to self within the past 6 months prior to admission? : No Suicidal Intent: No Has patient had any suicidal intent within the past 6 months prior to admission? : No Is patient at risk for suicide?: No Suicidal Plan?: No Has patient had any suicidal plan within the past 6 months prior to admission? : No Access to Means: No What has been your use of drugs/alcohol within the last 12 months?: n/a Previous Attempts/Gestures: No How many times?: 0 Other Self Harm Risks: 0 Intentional Self Injurious Behavior: None Family Suicide History: Yes Recent stressful life event(s): Conflict (Comment), Other (Comment) Persecutory voices/beliefs?: No Depression: Yes Depression Symptoms: Tearfulness, Fatigue, Loss of interest in usual pleasures, Feeling angry/irritable Substance abuse history and/or treatment for substance abuse?: No Suicide prevention information given to non-admitted patients: Not applicable  Risk to Others within the past 6 months Homicidal Ideation: No Does patient have any lifetime risk of violence toward others beyond the six months prior to admission? : No Thoughts of Harm to Others: No Current Homicidal Intent: No Current Homicidal Plan: No Access to Homicidal Means: No History of harm to others?:  No Assessment of Violence: None Noted Does patient have access to weapons?: No Criminal Charges Pending?: No Does patient have a court date: No Is patient on probation?: No  Psychosis Hallucinations: None noted Delusions: None noted  Mental Status Report Appearance/Hygiene: Unremarkable Eye Contact: Fair Motor Activity: Freedom of movement Speech: Logical/coherent Level of Consciousness: Alert Mood: Depressed Affect: Depressed Anxiety Level: Severe Thought Processes: Coherent, Relevant Judgement: Impaired Orientation: Person, Place, Time, Situation Obsessive Compulsive Thoughts/Behaviors: None  Cognitive Functioning Concentration: Normal Memory: Recent Intact, Remote Intact IQ: Average Insight: Fair Impulse Control: Fair Appetite: Fair Vegetative Symptoms: Staying in bed  ADLScreening [facility] Assessment Services) Patient's cognitive ability adequate to safely complete daily activities?: Yes Patient able to express need for assistance with ADLs?: Yes Independently performs ADLs?: Yes (appropriate for developmental age)  Prior Inpatient Therapy Prior Inpatient Therapy: No  Prior Outpatient Therapy Prior Outpatient Therapy: Yes Prior Therapy Dates: ongoing Prior Therapy Facilty/Provider(s): [facility] Reason for Treatment: depression Does patient have an ACCT team?: No Does patient have Intensive In-House Services?  : No Does patient have [facility] services? : No Does patient have P4CC services?: No  ADL Screening (condition at time of admission) Patient's cognitive ability adequate to safely complete daily activities?: Yes Is the patient deaf or have  difficulty hearing?: No Does the patient have difficulty seeing, even when wearing glasses/contacts?: No Does the patient have difficulty concentrating, remembering, or making decisions?: No Patient able to express need for assistance with ADLs?: Yes Does the patient have difficulty dressing or bathing?:  No Independently performs ADLs?: Yes (appropriate for developmental age)             Regulatory affairs officer (For Healthcare) Does Patient Have a Medical Advance Directive?: No    Additional Information 1:1 In Past 12 Months?: No CIRT Risk: No Elopement Risk: No Does patient have medical clearance?: No     Disposition:  Disposition Initial Assessment Completed for this Encounter: Yes Disposition of Patient: Outpatient treatment Type of outpatient treatment: Adult  Mollie Germany 10/30/2016 3:53 PM

## 2016-10-31 DIAGNOSIS — F411 Generalized anxiety disorder: Secondary | ICD-10-CM | POA: Diagnosis not present

## 2016-10-31 DIAGNOSIS — F3181 Bipolar II disorder: Secondary | ICD-10-CM | POA: Diagnosis not present

## 2016-11-14 IMAGING — MR MR CERVICAL SPINE W/O CM
4 of 5 series · 20 of 48 positions shown · non-contrast
Comparison: MRI 02/18/2013 and radiographs 06/17/2014

CLINICAL DATA: Two-month history of neck and right-sided shoulder
and arm pain. History of prior cervical fusion in 8980.

EXAM:
MRI CERVICAL SPINE WITHOUT CONTRAST
TECHNIQUE: Multiplanar and multiecho pulse sequences of the cervical spine, to
include the craniocervical junction and cervicothoracic junction,
and the thoracic spine, were obtained without intravenous contrast.

[Series 3: T2 · sagittal · 3.0mm · 0.41mm/px · 6 of 13 slices shown (1 of 2)]
[im 1/13]
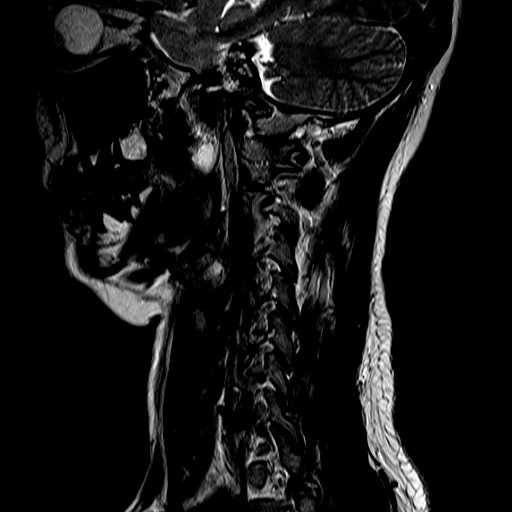
[im 3/13]
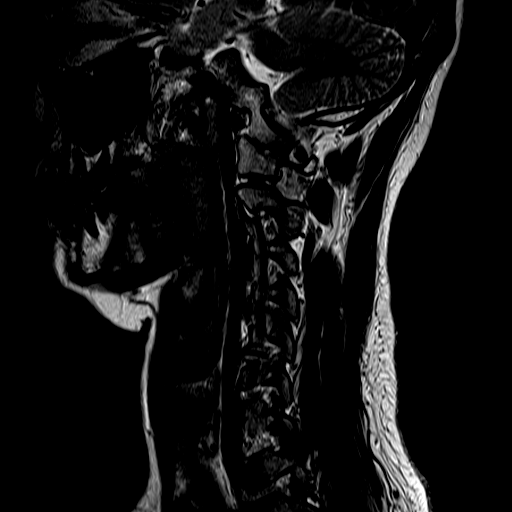
[im 5/13]
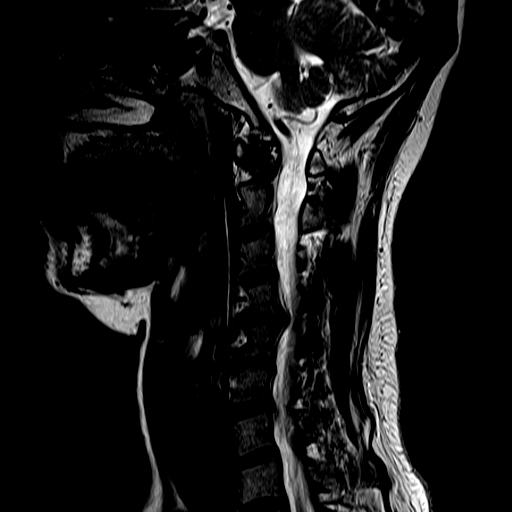
[im 8/13]
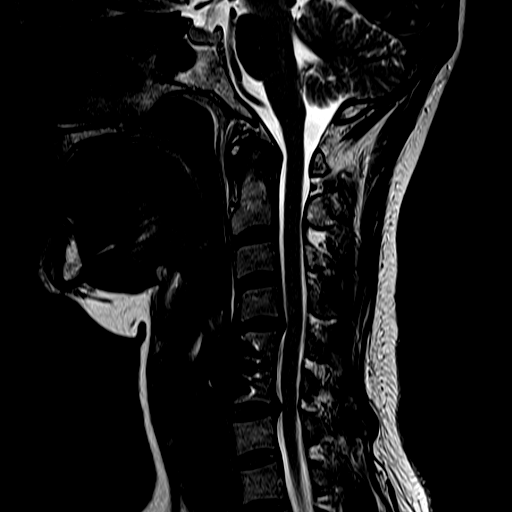
[im 10/13]
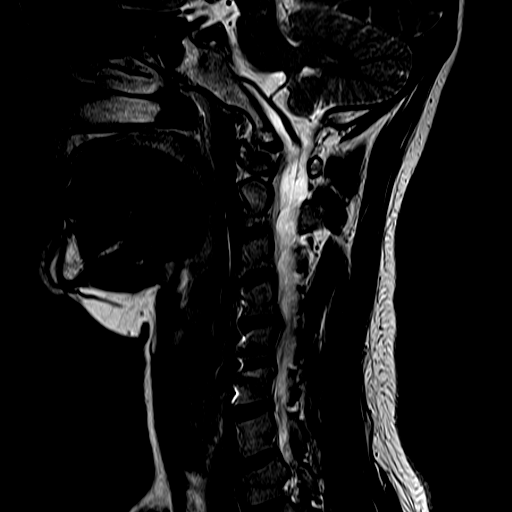
[im 13/13]
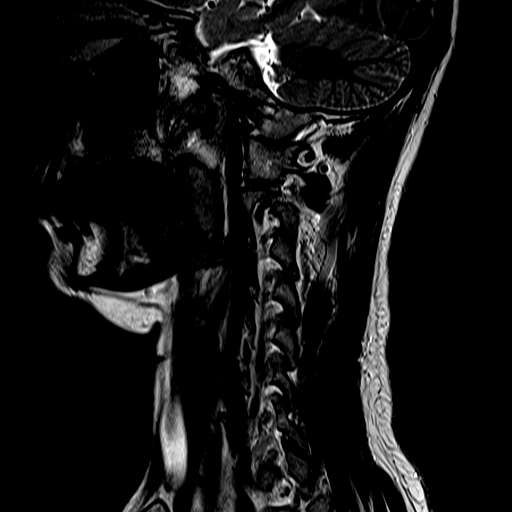

[Series 4: FLAIR · sagittal · 3.0mm · 0.47mm/px · 3 of 13 slices shown]
[im 1/13]
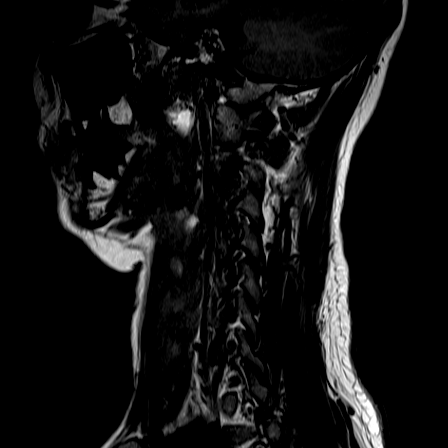
[im 7/13]
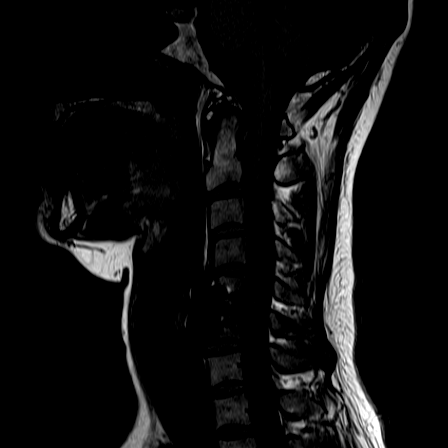
[im 13/13]
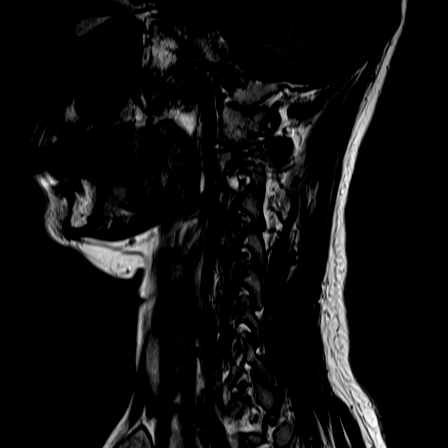

[Series 6: t2_medic · axial · 3.0mm · 0.19mm/px · z∈[-52,+39]mm · 3 of 40 slices shown]
[im 6/40]
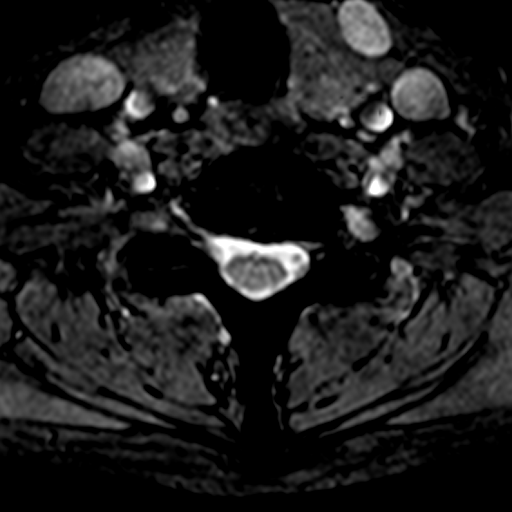
[im 21/40]
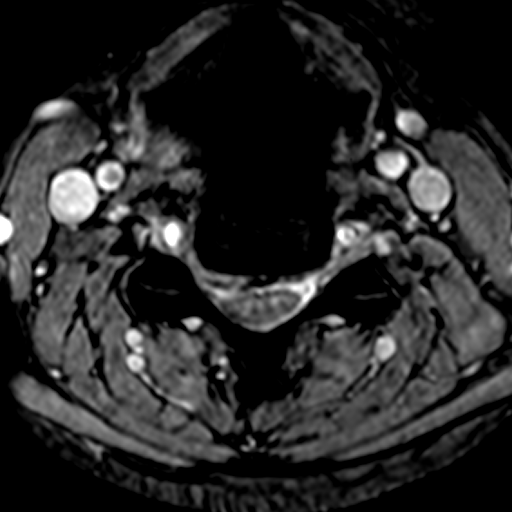
[im 34/40]
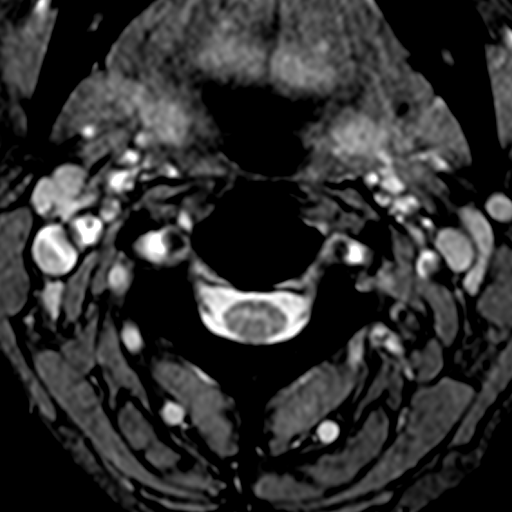

[Series 7: T2 · axial · 3.0mm · 0.17mm/px · z∈[-60,+41]mm · 8 of 40 slices shown (2 of 2)]
[im 3/40]
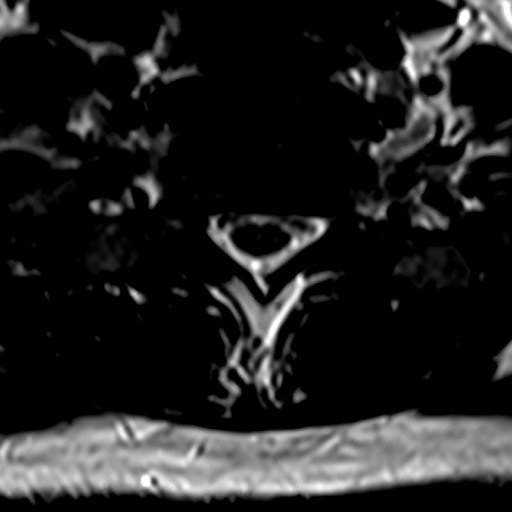
[im 6/40]
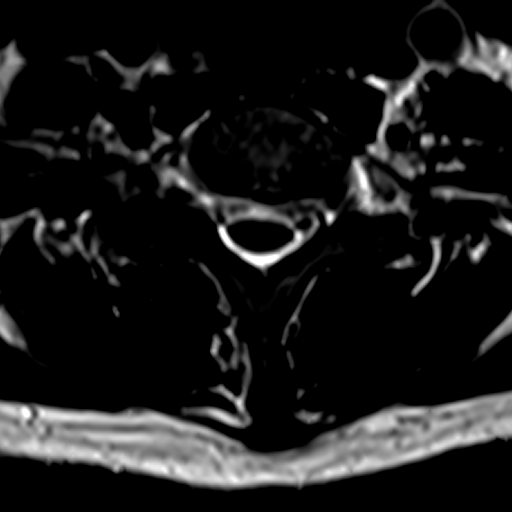
[im 8/40]
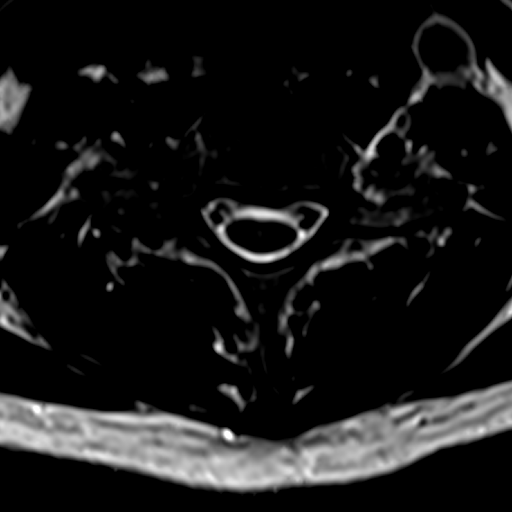
[im 14/40]
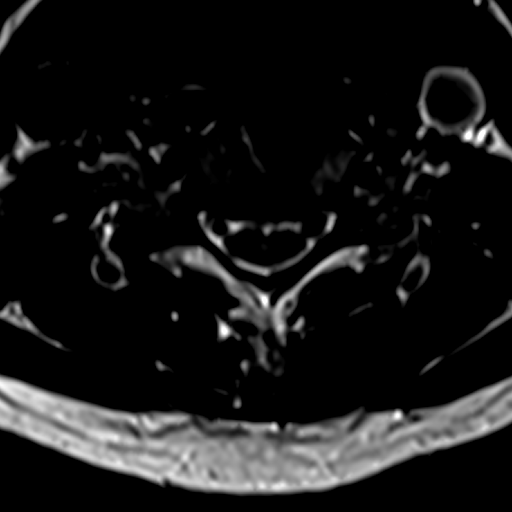
[im 19/40]
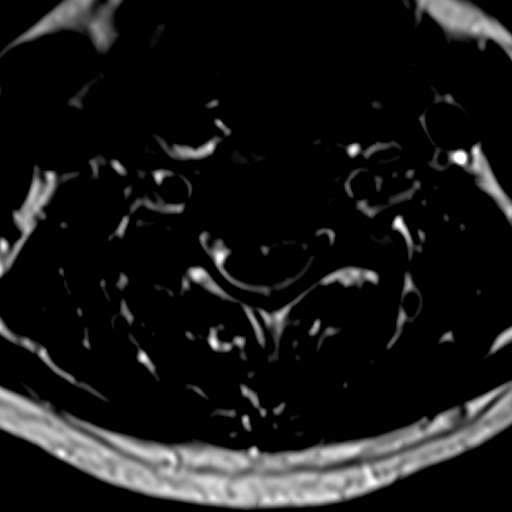
[im 21/40]
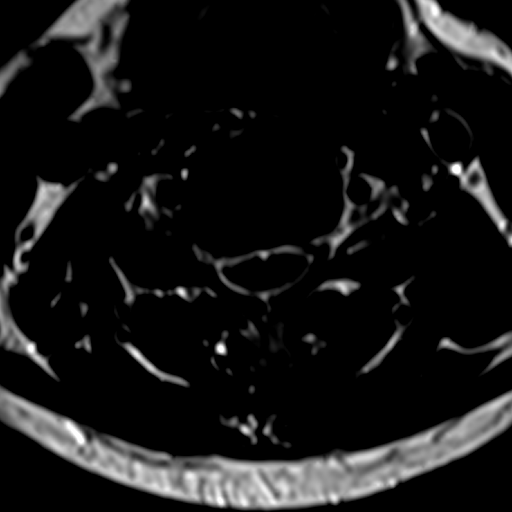
[im 24/40]
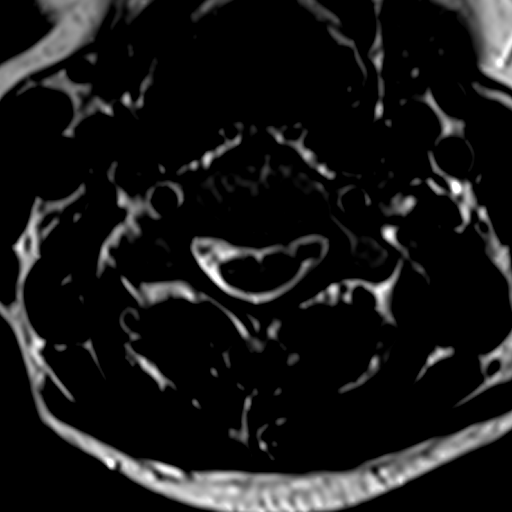
[im 34/40]
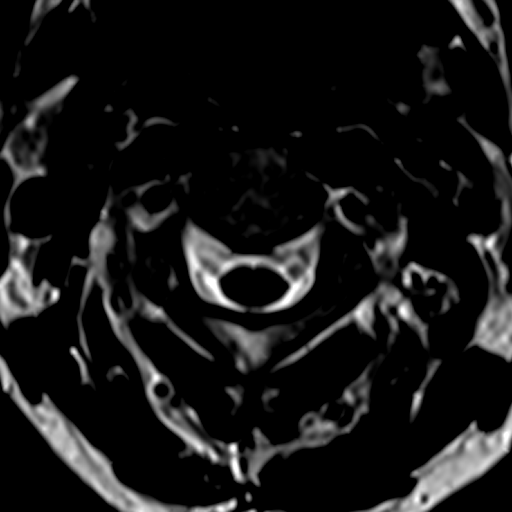

[20 of 48 positions shown; findings below may reference images not displayed]

FINDINGS: MRI CERVICAL SPINE FINDINGS

Mild straightening of the normal cervical lordosis. The vertebral
bodies demonstrate normal marrow signal. Anterior and interbody
fusion changes are noted at C5-6. No complicating features. The
cervical spinal cord demonstrates normal signal intensity. No cord
lesions or syrinx.

C2-3:  No significant findings.

C3-4:  No significant findings.

C4-5: There is a moderate-sized central, right paracentral and right
foraminal disc protrusion with mass effect on the thecal sac and
right C5 nerve root.

C5-6: Anterior and interbody fusion changes. No complicating
features. No spinal or foraminal stenosis.

C6-7: Focal central disc protrusion with focal mass effect on the
ventral thecal sac and narrowing of the ventral CSF space. No
foraminal stenosis.

C7-T1:  No significant findings
IMPRESSION: 1. Postoperative changes at C5-6 with anterior and interbody fusion.
No complicating features. No spinal or foraminal stenosis.
2. Unfortunately, there are now disc protrusions above and below the
fusion level as discussed above.

## 2016-11-28 DIAGNOSIS — F411 Generalized anxiety disorder: Secondary | ICD-10-CM | POA: Diagnosis not present

## 2016-11-28 DIAGNOSIS — F3181 Bipolar II disorder: Secondary | ICD-10-CM | POA: Diagnosis not present

## 2016-12-19 DIAGNOSIS — F411 Generalized anxiety disorder: Secondary | ICD-10-CM | POA: Diagnosis not present

## 2016-12-19 DIAGNOSIS — F3181 Bipolar II disorder: Secondary | ICD-10-CM | POA: Diagnosis not present

## 2017-02-06 DIAGNOSIS — F3181 Bipolar II disorder: Secondary | ICD-10-CM | POA: Diagnosis not present

## 2017-02-06 DIAGNOSIS — F411 Generalized anxiety disorder: Secondary | ICD-10-CM | POA: Diagnosis not present

## 2017-06-17 DIAGNOSIS — M545 Low back pain: Secondary | ICD-10-CM | POA: Diagnosis not present

## 2017-08-28 DIAGNOSIS — F3181 Bipolar II disorder: Secondary | ICD-10-CM | POA: Diagnosis not present

## 2017-08-28 DIAGNOSIS — F411 Generalized anxiety disorder: Secondary | ICD-10-CM | POA: Diagnosis not present

## 2017-09-27 ENCOUNTER — Ambulatory Visit: Payer: 59 | Admitting: Family Medicine

## 2017-09-27 ENCOUNTER — Encounter: Payer: Self-pay | Admitting: Family Medicine

## 2017-09-27 VITALS — BP 134/86 | Temp 98.9°F | Ht 67.0 in | Wt 183.0 lb

## 2017-09-27 DIAGNOSIS — B9689 Other specified bacterial agents as the cause of diseases classified elsewhere: Secondary | ICD-10-CM

## 2017-09-27 DIAGNOSIS — J019 Acute sinusitis, unspecified: Secondary | ICD-10-CM

## 2017-09-27 DIAGNOSIS — M7062 Trochanteric bursitis, left hip: Secondary | ICD-10-CM

## 2017-09-27 MED ORDER — ETODOLAC 400 MG PO TABS: 400.0000 mg | ORAL_TABLET | Freq: Two times a day (BID) | ORAL | 0 refills | Status: DC

## 2017-09-27 MED ORDER — CEFPROZIL 500 MG PO TABS: 500.0000 mg | ORAL_TABLET | Freq: Two times a day (BID) | ORAL | 0 refills | Status: AC

## 2017-09-27 MED ORDER — PREDNISONE 20 MG PO TABS: ORAL_TABLET | ORAL | 0 refills | Status: DC

## 2017-09-27 NOTE — Progress Notes (Signed)
   Subjective:    Patient ID: Christina Blake, female    DOB: 1971-03-19, 47 y.o.   MRN: [id number]  Hip Pain   The incident occurred more than 1 week ago. The pain is present in the left hip. The quality of the pain is described as aching. The pain is at a severity of 8/10. She has tried acetaminophen and heat for the symptoms.   Pt states she is having pain in both hips but left hip hurts more. Pt also states she is having pain in right ankle; same pain as in hip. 8/10 on pain scale. Also productive cough with yellow congestion and runny nose for a couple of days.  Cough and cong, three d ago , pos dsich  No fever, non flu feeling      Hip pain, bilat , two weeks, both hips, left the wors.  No hx of injuries   tyl prn , or tarthirits stngth prn     back cont to hurt   Ankle hurting this morn  Review of Systems No headache, no major weight loss or weight gain, no chest pain no back pain abdominal pain no change in bowel habits complete ROS otherwise negative     Objective:   Physical Exam  Alert and oriented, vitals reviewed and stable, NAD ENT-TM's and ext canals WNL bilat via otoscopic exam Soft palate, tonsils and post pharynx positive nasal congestion frontal fullness WNL via oropharyngeal exam Neck-symmetric, no masses; thyroid nonpalpable and nontender Pulmonary-no tachypnea or accessory muscle use; Clear without wheezes via auscultation Card--no abnrml murmurs, rhythm reg and rate WNL Carotid pulses symmetric, without bruits Hips good range of motion positive lateral tenderness to deep palpation positive pain with internal rotation left greater than right.  Ankle within normal limits   impression 1 trochanteric bursitis discussed.  Trial of prednisone taper.  Then anti-inflammatory range of motion exercises discussed to be followed by walking  2.  Rhinosinusitis/bronchitis with known elements of chronic smoking.  Will cover with antibiotics appropriately symptom  care discussed warning signs discussed  Greater than 50% of this 25 minute face to face visit was spent in counseling and discussion and coordination of care regarding the above diagnosis/diagnosies       Assessment & Plan:

## 2017-09-28 ENCOUNTER — Ambulatory Visit: Payer: BLUE CROSS/BLUE SHIELD | Admitting: Nurse Practitioner

## 2017-10-02 ENCOUNTER — Encounter: Payer: Self-pay | Admitting: Family Medicine

## 2017-10-02 DIAGNOSIS — Z1231 Encounter for screening mammogram for malignant neoplasm of breast: Secondary | ICD-10-CM | POA: Diagnosis not present

## 2017-10-04 ENCOUNTER — Telehealth: Payer: Self-pay | Admitting: Family Medicine

## 2017-10-04 NOTE — Telephone Encounter (Signed)
Review screening mammogram results from [facility] in results folder.

## 2017-10-10 ENCOUNTER — Encounter: Payer: Self-pay | Admitting: Nurse Practitioner

## 2017-10-10 ENCOUNTER — Ambulatory Visit (INDEPENDENT_AMBULATORY_CARE_PROVIDER_SITE_OTHER): Payer: 59 | Admitting: Nurse Practitioner

## 2017-10-10 VITALS — BP 138/80 | Ht 67.75 in | Wt 180.0 lb

## 2017-10-10 DIAGNOSIS — R5383 Other fatigue: Secondary | ICD-10-CM

## 2017-10-10 DIAGNOSIS — Z124 Encounter for screening for malignant neoplasm of cervix: Secondary | ICD-10-CM | POA: Diagnosis not present

## 2017-10-10 DIAGNOSIS — Z13220 Encounter for screening for lipoid disorders: Secondary | ICD-10-CM

## 2017-10-10 DIAGNOSIS — Z0000 Encounter for general adult medical examination without abnormal findings: Secondary | ICD-10-CM | POA: Diagnosis not present

## 2017-10-10 DIAGNOSIS — R69 Illness, unspecified: Secondary | ICD-10-CM | POA: Diagnosis not present

## 2017-10-10 DIAGNOSIS — R87610 Atypical squamous cells of undetermined significance on cytologic smear of cervix (ASC-US): Secondary | ICD-10-CM | POA: Diagnosis not present

## 2017-10-10 DIAGNOSIS — Z1151 Encounter for screening for human papillomavirus (HPV): Secondary | ICD-10-CM | POA: Diagnosis not present

## 2017-10-10 NOTE — Progress Notes (Signed)
Subjective:    Patient ID: Christina Blake, female    DOB: 12-10-1970, 47 y.o.   MRN: [id number]  CC: annual exam  HPI: patient presents for her annual exam.  She is also concerned about some weakness and shaking in her legs that happens whenever she walks up and down stairs.  She says it can happen after just one flight of stairs.  She is worried it is related to her back issues.  She is not experiencing pain in her legs.  Past Medical History:  Diagnosis Date  . Anxiety   . Depression   . History of agoraphobia 2007  . Hyperlipidemia   . Orthostatic hypotension   . Pulmonary embolism, bilateral ([place]) 2012   Past Surgical History:  Procedure Laterality Date  . ANTERIOR CERVICAL DECOMP/DISCECTOMY FUSION N/A 09/04/2014   Procedure: Cervical four-five Cervical six-seven Anterior cervical decompression/diskectomy,fusion with exploration of Cervical five-six Fusion (Removal of Helix plate);  Surgeon: Erline Levine, MD;  Location: [facility];  Service: Neurosurgery;  Laterality: N/A;  . BACK SURGERY    . C5 & C6 fusion  07/25/12  . L4,L5 & S1 fusion  07/23/12  . OSTEOTOMY     Left foot 2nd toe-[facility]  . Uterine ablation     No hospitalizations, accidents, or injuries in the past three months.  No Known Allergies  Current Outpatient Medications on File Prior to Visit  Medication Sig Dispense Refill  . ALPRAZolam (XANAX) 0.5 MG tablet Take 0.5 mg by mouth 3 (three) times daily as needed for anxiety.    . Calcium Citrate-Vitamin D (CALCIUM + D PO) Take by mouth.    . dimenhyDRINATE (DRAMAMINE) 50 MG tablet Take 25 mg by mouth as needed.    . etodolac (LODINE) 400 MG tablet Take 1 tablet (400 mg total) by mouth 2 (two) times daily. Start AFTER prednisone 28 tablet 0  . lamoTRIgine (LAMICTAL) 200 MG tablet Take 200 mg by mouth at bedtime.     Marland Kitchen lurasidone (LATUDA) 40 MG TABS tablet Take 40 mg by mouth daily with breakfast.    . Multiple Vitamin (MULTIVITAMIN) capsule Take 1 capsule by  mouth daily.    . Nutritional Supplements (ESTROVEN PO) Take by mouth.    . Nutritional Supplements (ESTROVEN PO) Take by mouth. One qhs    . PARoxetine (PAXIL-CR) 37.5 MG 24 hr tablet Take 75 mg by mouth daily.    . temazepam (RESTORIL) 15 MG capsule Take 15 mg by mouth at bedtime as needed for sleep.    . traZODone (DESYREL) 100 MG tablet Take 100 mg by mouth at bedtime.     Marland Kitchen BLACK COHOSH PO Take 1 tablet by mouth daily. At bedtime    . methocarbamol (ROBAXIN) 500 MG tablet Take 1 tablet (500 mg total) by mouth every 8 (eight) hours as needed for muscle spasms. (Patient not taking: Reported on 09/27/2017) 60 tablet 0  . predniSONE (DELTASONE) 20 MG tablet Take three for 3 days, two for 3 days and then one for 3 days (Patient not taking: Reported on 10/10/2017) 18 tablet 0   No current facility-administered medications on file prior to visit.    Family history: Mother: HTN, heart disease, cancer Father: diabetes, heart disease  Immunizations: UTD  Social history: works as a Marine scientist at [employer].  Tries to eat a healthy diet.  Walks half a mile occasionally.  Current 2ppd smoker, has never used smokeless tobacco.  Does not drink alcohol or use illicit  drugs.  No new sexual partners; not currently sexually active.  Has not traveled outside the [place] in the past six months.  Lives with her husband, daughter, grandchild, and dog.  No issues sleeping.  LMP: ended two days ago.  Cycles are monthly and regular.  Flow is usually moderate.  Review of Systems  Constitutional: Positive for fatigue. Negative for activity change, appetite change and unexpected weight change.       Fatigue from job and stress  HENT: Negative for congestion, postnasal drip, rhinorrhea, sneezing and sore throat.   Eyes: Negative for visual disturbance.  Respiratory: Negative for cough, chest tightness, shortness of breath and wheezing.   Cardiovascular: Negative for chest pain and leg swelling.  Gastrointestinal:  Negative for abdominal distention, abdominal pain, blood in stool, constipation, diarrhea, nausea and vomiting.  Genitourinary: Negative for difficulty urinating, dysuria, genital sores, menstrual problem, pelvic pain, vaginal bleeding, vaginal discharge and vaginal pain.  Musculoskeletal: Positive for back pain.       Chronic back pain  Neurological: Positive for weakness and light-headedness. Negative for dizziness, speech difficulty, numbness and headaches.       History of orthostatic hypotension, gets light-headed sometimes with position change. Leg weakness when climbing stairs.  Psychiatric/Behavioral: Negative for confusion, decreased concentration and sleep disturbance. The patient is nervous/anxious.        History of anxiety   Depression screen PHQ 2/9 10/10/2017  Decreased Interest 2  Down, Depressed, Hopeless 1  PHQ - 2 Score 3  Altered sleeping 1  Tired, decreased energy 1  Change in appetite 1  Feeling bad or failure about yourself  1  Trouble concentrating 1  Moving slowly or fidgety/restless 1  Suicidal thoughts 0  PHQ-9 Score 9       Objective:   Physical Exam  Constitutional: Vital signs are normal. No distress.  HENT:  Right Ear: Hearing and tympanic membrane normal.  Left Ear: Hearing and tympanic membrane normal.  Mouth/Throat: No posterior oropharyngeal erythema.  Pharynx has clear drainage;   Neck: Normal range of motion. Neck supple. No thyroid mass and no thyromegaly present.  Cardiovascular: Normal rate, regular rhythm and normal heart sounds. Exam reveals no gallop, no S3 and no S4.  No murmur heard. Pulmonary/Chest: Effort normal and breath sounds normal. She exhibits no mass and no tenderness. Right breast exhibits no inverted nipple, no mass, no nipple discharge, no skin change and no tenderness. Left breast exhibits no inverted nipple, no mass, no nipple discharge, no skin change and no tenderness.  Abdominal: Soft. Normal appearance. She exhibits  no distension and no mass. There is no tenderness.  Genitourinary: Vagina normal and uterus normal. There is no rash, tenderness, lesion or injury on the right labia. There is no rash, tenderness, lesion or injury on the left labia. Cervix exhibits discharge. Cervix exhibits no motion tenderness and no friability.  Genitourinary Comments: White mucus discharge around cervix  Lymphadenopathy:    She has no cervical adenopathy.    She has no axillary adenopathy.  Skin: Skin is warm, dry and intact.  Psychiatric: She has a normal mood and affect. Her speech is normal and behavior is normal. Thought content normal. Cognition and memory are normal.   Blood pressure 138/80, height 5' 7.75" (1.721 m), weight 180 lb (81.6 kg), last menstrual period 10/08/2017. Body mass index is 27.57 kg/m.     Assessment & Plan:   Problem List Items Addressed This Visit    None    Visit Diagnoses  Routine general medical examination at a health care facility    -  Primary   Relevant Orders   Pap IG and HPV (high risk) DNA detection   Hepatic function panel   Lipid panel   Basic metabolic panel   VITAMIN D 25 Hydroxy (Vit-D Deficiency, Fractures)   TSH   Screening for cervical cancer       Relevant Orders   Pap IG and HPV (high risk) DNA detection   Screening for HPV (human papillomavirus)       Relevant Orders   Pap IG and HPV (high risk) DNA detection   Other fatigue       Relevant Orders   VITAMIN D 25 Hydroxy (Vit-D Deficiency, Fractures)   TSH   Screening for lipid disorders       Relevant Orders   Lipid panel     Medications: no new medications at this time  Teaching: Encouraged smoking cessation or at least cutting back to one ppd.  Discussed potential risks associated with smoking.  Encouraged healthy eating and exercise.  Recommended that she contact her neurosurgeon about the weakness in her legs.  Recommend referral to neurology if symptoms persist. Reviewed health maintenance items  with her.  Return in about 1 year (around 10/11/2018) for physical.

## 2017-10-11 ENCOUNTER — Encounter: Payer: Self-pay | Admitting: Nurse Practitioner

## 2017-10-12 LAB — PAP IG AND HPV HIGH-RISK
HPV, HIGH-RISK: NEGATIVE
PAP Smear Comment: 0

## 2017-11-13 DIAGNOSIS — R5383 Other fatigue: Secondary | ICD-10-CM | POA: Diagnosis not present

## 2017-11-13 DIAGNOSIS — Z13220 Encounter for screening for lipoid disorders: Secondary | ICD-10-CM | POA: Diagnosis not present

## 2017-11-13 DIAGNOSIS — Z0000 Encounter for general adult medical examination without abnormal findings: Secondary | ICD-10-CM | POA: Diagnosis not present

## 2017-11-14 LAB — HEPATIC FUNCTION PANEL
ALBUMIN: 4.2 g/dL (ref 3.5–5.5)
ALT: 23 IU/L (ref 0–32)
AST: 13 IU/L (ref 0–40)
Alkaline Phosphatase: 105 IU/L (ref 39–117)
Bilirubin Total: 0.2 mg/dL (ref 0.0–1.2)
Bilirubin, Direct: 0.07 mg/dL (ref 0.00–0.40)
TOTAL PROTEIN: 6.4 g/dL (ref 6.0–8.5)

## 2017-11-14 LAB — LIPID PANEL
CHOLESTEROL TOTAL: 212 mg/dL — AB (ref 100–199)
Chol/HDL Ratio: 3.4 ratio (ref 0.0–4.4)
HDL: 63 mg/dL (ref >39)
LDL CALC: 139 mg/dL — AB (ref 0–99)
Triglycerides: 49 mg/dL (ref 0–149)
VLDL CHOLESTEROL CAL: 10 mg/dL (ref 5–40)

## 2017-11-14 LAB — BASIC METABOLIC PANEL
BUN / CREAT RATIO: 19 (ref 9–23)
BUN: 17 mg/dL (ref 6–24)
CO2: 20 mmol/L (ref 20–29)
Calcium: 9.2 mg/dL (ref 8.7–10.2)
Chloride: 108 mmol/L — ABNORMAL HIGH (ref 96–106)
Creatinine, Ser: 0.9 mg/dL (ref 0.57–1.00)
GFR calc Af Amer: 88 mL/min/{1.73_m2} (ref >59)
GFR, EST NON AFRICAN AMERICAN: 76 mL/min/{1.73_m2} (ref >59)
GLUCOSE: 88 mg/dL (ref 65–99)
POTASSIUM: 4.8 mmol/L (ref 3.5–5.2)
SODIUM: 141 mmol/L (ref 134–144)

## 2017-11-14 LAB — VITAMIN D 25 HYDROXY (VIT D DEFICIENCY, FRACTURES): Vit D, 25-Hydroxy: 27.1 ng/mL — ABNORMAL LOW (ref 30.0–100.0)

## 2017-11-14 LAB — TSH: TSH: 2.11 u[IU]/mL (ref 0.450–4.500)

## 2018-03-20 ENCOUNTER — Encounter: Payer: Self-pay | Admitting: Family Medicine

## 2018-03-20 ENCOUNTER — Ambulatory Visit: Payer: 59 | Admitting: Family Medicine

## 2018-03-20 VITALS — BP 116/74 | Ht 67.75 in | Wt 190.0 lb

## 2018-03-20 DIAGNOSIS — M25551 Pain in right hip: Secondary | ICD-10-CM

## 2018-03-20 DIAGNOSIS — M7061 Trochanteric bursitis, right hip: Secondary | ICD-10-CM

## 2018-03-20 MED ORDER — HYDROCODONE-ACETAMINOPHEN 5-325 MG PO TABS: ORAL_TABLET | ORAL | 0 refills | Status: DC

## 2018-03-20 NOTE — Patient Instructions (Signed)
Trochanteric Bursitis Trochanteric bursitis is a condition that causes hip pain. Trochanteric bursitis happens when fluid-filled sacs (bursae) in the hip get irritated. Normally these sacs absorb shock and help strong bands of tissue (tendons) in your hip glide smoothly over each other and over your hip bones. What are the causes? This condition results from increased friction between the hip bones and the tendons that go over them. This condition can happen if you:  Have weak hips.  Use your hip muscles too much (overuse).  Get hit in the hip.  What increases the risk? This condition is more likely to develop in:  Women.  Adults who are middle-aged or older.  People with arthritis or a spinal condition.  People with weak buttocks muscles (gluteal muscles).  People who have one leg that is shorter than the other.  People who participate in certain kinds of athletic activities, such as: ? Running sports, especially long-distance running. ? Contact sports, like football or martial arts. ? Sports in which falls may occur, like skiing.  What are the signs or symptoms? The main symptom of this condition is pain and tenderness over the point of your hip. The pain may be:  Sharp and intense.  Dull and achy.  Felt on the outside of your thigh.  It may increase when you:  Lie on your side.  Walk or run.  Go up on stairs.  Sit.  Stand up after sitting.  Stand for long periods of time.  How is this diagnosed? This condition may be diagnosed based on:  Your symptoms.  Your medical history.  A physical exam.  Imaging tests, such as: ? X-rays to check your bones. ? An MRI or ultrasound to check your tendons and muscles.  During your physical exam, your health care provider will check the movement and strength of your hip. He or she may press on the point of your hip to check for pain. How is this treated? This condition may be treated by:  Resting.  Reducing  your activity.  Avoiding activities that cause pain.  Using crutches, a cane, or a walker to decrease the strain on your hip.  Taking medicine to help with swelling.  Having medicine injected into the bursae to help with swelling.  Using ice, heat, and massage therapy for pain relief.  Physical therapy exercises for strength and flexibility.  Surgery (rare).  Follow these instructions at home: Activity  Rest.  Avoid activities that cause pain.  Return to your normal activities as told by your health care provider. Ask your health care provider what activities are safe for you. Managing pain, stiffness, and swelling  Take over-the-counter and prescription medicines only as told by your health care provider.  If directed, apply heat to the injured area as told by your health care provider. ? Place a towel between your skin and the heat source. ? Leave the heat on for 20-30 minutes. ? Remove the heat if your skin turns bright red. This is especially important if you are unable to feel pain, heat, or cold. You may have a greater risk of getting burned.  If directed, apply ice to the injured area: ? Put ice in a plastic bag. ? Place a towel between your skin and the bag. ? Leave the ice on for 20 minutes, 2-3 times a day. General instructions  If the affected leg is one that you use for driving, ask your health care provider when it is safe to drive.    Use crutches, a cane, or a walker as told by your health care provider.  If one of your legs is shorter than the other, get fitted for a shoe insert.  Lose weight if you are overweight. How is this prevented?  Wear supportive footwear that is appropriate for your sport.  If you have hip pain, start any new exercise or sport slowly.  Maintain physical fitness, including: ? Strength. ? Flexibility. Contact a health care provider if:  Your pain does not improve with 2-4 weeks. Get help right away if:  You develop  severe pain.  You have a fever.  You develop increased redness over your hip.  You have a change in your bowel function or bladder function.  You cannot control the muscles in your feet. This information is not intended to replace advice given to you by your health care provider. Make sure you discuss any questions you have with your health care provider. Document Released: 07/27/2004 Document Revised: 02/23/2016 Document Reviewed: 06/04/2015 Elsevier Interactive Patient Education  2018 Elsevier Inc.  

## 2018-03-20 NOTE — Progress Notes (Signed)
   Subjective:    Patient ID: Christina Blake, female    DOB: 26-May-1971, 47 y.o.   MRN: [id number]  Hip Pain   Incident onset: more than one month ago. Pain location: bilateral hips. She has tried nothing for the symptoms.   Reports bilateral hip pain 4/10 going on for over 1 month, worse with getting out of car, squatting, bending, or walking.  Hurts when lying on right side. Denies pain like this before. Hip pain improved when sitting still or lying flat. Denies any particular onset, no injury or falls, gradually worsened over time.  Reports taking 500 mg tylenol occasionally and 800 mg ibuprofen, not helpful anymore. No swelling, no knee pain.  Reports chronic low back pain r/t fusion surgery many years ago.  Reports getting very little physical activity outside of work.  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  Review of Systems  Musculoskeletal: Negative for joint swelling.       Bilateral hip pain, worse to right lateral side, See HPI  All other systems reviewed and are negative.      Objective:   Physical Exam  Constitutional: She is oriented to person, place, and time. She appears well-developed and well-nourished. No distress.  HENT:  Head: Normocephalic and atraumatic.  Eyes: Right eye exhibits no discharge. Left eye exhibits no discharge.  Pulmonary/Chest: No respiratory distress.  Musculoskeletal: She exhibits tenderness. She exhibits no edema or deformity.       Right hip: She exhibits decreased range of motion and tenderness. She exhibits no swelling.  Mild tenderness to left greater trochanter, severe tenderness to right greater trochanter.  Active hip flexion limited on right side d/t hip pain, pain to outer right hip with internal rotation of the hip. ROM normal on left side.   Neurological: She is alert and  oriented to person, place, and time.  Skin: Skin is warm and dry.  Psychiatric: She has a normal mood and affect.  Nursing note and vitals reviewed.      Assessment & Plan:  Trochanteric bursitis of right hip Symptoms and exam consistent with trochanteric bursitis of the right hip.  Steroid injection discussed and patient willing to proceed with that today, if not helpful will refer to orthopedics for further evaluation.  Short pain rx given by Dr. Richardson Landry. Work excuse provided. She will f/u prn if symptoms worsen or fail to improve.

## 2018-03-21 ENCOUNTER — Encounter: Payer: Self-pay | Admitting: Family Medicine

## 2018-03-26 MED ORDER — METHYLPREDNISOLONE ACETATE 40 MG/ML IJ SUSP: 40.0000 mg | Freq: Once | INTRAMUSCULAR | Status: DC

## 2018-04-02 DIAGNOSIS — F411 Generalized anxiety disorder: Secondary | ICD-10-CM | POA: Diagnosis not present

## 2018-04-02 DIAGNOSIS — R69 Illness, unspecified: Secondary | ICD-10-CM | POA: Diagnosis not present

## 2018-05-21 ENCOUNTER — Telehealth: Payer: Self-pay | Admitting: Family Medicine

## 2018-05-21 ENCOUNTER — Other Ambulatory Visit: Payer: Self-pay | Admitting: Family Medicine

## 2018-05-21 MED ORDER — ONDANSETRON 4 MG PO TBDP: ORAL_TABLET | ORAL | 0 refills | Status: DC

## 2018-05-21 NOTE — Telephone Encounter (Signed)
zofran 4mg  dissovable 16 one q six hrw prn nausea

## 2018-05-21 NOTE — Telephone Encounter (Signed)
Please advise 

## 2018-05-21 NOTE — Telephone Encounter (Signed)
Medication sent in and patient is aware.  

## 2018-05-21 NOTE — Telephone Encounter (Signed)
Patient has caught the stomach bug that is going around and is throwing up.  Wanted to know if we could call in something for nausea to [facility] in [place]. (no available appts left today) Last OV was 03-20-18

## 2018-05-22 ENCOUNTER — Telehealth: Payer: Self-pay | Admitting: Family Medicine

## 2018-05-22 DIAGNOSIS — Z79899 Other long term (current) drug therapy: Secondary | ICD-10-CM | POA: Diagnosis not present

## 2018-05-22 DIAGNOSIS — A0472 Enterocolitis due to Clostridium difficile, not specified as recurrent: Secondary | ICD-10-CM | POA: Diagnosis not present

## 2018-05-22 DIAGNOSIS — R197 Diarrhea, unspecified: Secondary | ICD-10-CM | POA: Diagnosis not present

## 2018-05-22 DIAGNOSIS — R112 Nausea with vomiting, unspecified: Secondary | ICD-10-CM | POA: Diagnosis not present

## 2018-05-22 DIAGNOSIS — R69 Illness, unspecified: Secondary | ICD-10-CM | POA: Diagnosis not present

## 2018-05-22 NOTE — Telephone Encounter (Signed)
Left message to return call 

## 2018-05-22 NOTE — Telephone Encounter (Signed)
Dr Mariane Duval

## 2018-05-22 NOTE — Telephone Encounter (Signed)
Lomotil definite no no when bact colitis suspected

## 2018-05-22 NOTE — Telephone Encounter (Signed)
Patient states she went to the ed today because of vomiting and diarrhea. They gave her a new medication to treat c-diff (just incase this was what she had),Flagyl,and Vancomycin. She says she is only going to fill the flagyl. She says the vomiting has went away,but she is still having diarrhea so much that she is wearing depends.She has no fever,but wants to have lomotil sent in. Please advise.

## 2018-05-22 NOTE — Telephone Encounter (Signed)
Pt was seen at [facility] ER for nausea, vomiting, diarrhea and dehydration.  Pt would like a prescription filled for lomotil. Advise.    [place]

## 2018-05-24 NOTE — Telephone Encounter (Signed)
Contacted patient and informed patient that we could not prescribe lomotil if bacterial colitis is suspected. Pt verbalized understanding.

## 2018-06-01 DIAGNOSIS — R69 Illness, unspecified: Secondary | ICD-10-CM | POA: Diagnosis not present

## 2018-06-01 DIAGNOSIS — M48061 Spinal stenosis, lumbar region without neurogenic claudication: Secondary | ICD-10-CM | POA: Diagnosis not present

## 2018-06-01 DIAGNOSIS — Z79899 Other long term (current) drug therapy: Secondary | ICD-10-CM | POA: Diagnosis not present

## 2018-06-01 DIAGNOSIS — S199XXA Unspecified injury of neck, initial encounter: Secondary | ICD-10-CM | POA: Diagnosis not present

## 2018-06-01 DIAGNOSIS — M549 Dorsalgia, unspecified: Secondary | ICD-10-CM | POA: Diagnosis not present

## 2018-06-01 DIAGNOSIS — S161XXA Strain of muscle, fascia and tendon at neck level, initial encounter: Secondary | ICD-10-CM | POA: Diagnosis not present

## 2018-06-01 DIAGNOSIS — Z981 Arthrodesis status: Secondary | ICD-10-CM | POA: Diagnosis not present

## 2018-06-01 DIAGNOSIS — N3001 Acute cystitis with hematuria: Secondary | ICD-10-CM | POA: Diagnosis not present

## 2018-06-01 DIAGNOSIS — Z86711 Personal history of pulmonary embolism: Secondary | ICD-10-CM | POA: Diagnosis not present

## 2018-06-01 DIAGNOSIS — M542 Cervicalgia: Secondary | ICD-10-CM | POA: Diagnosis not present

## 2018-06-01 DIAGNOSIS — S39012A Strain of muscle, fascia and tendon of lower back, initial encounter: Secondary | ICD-10-CM | POA: Diagnosis not present

## 2018-06-01 DIAGNOSIS — S3992XA Unspecified injury of lower back, initial encounter: Secondary | ICD-10-CM | POA: Diagnosis not present

## 2018-08-15 DIAGNOSIS — F411 Generalized anxiety disorder: Secondary | ICD-10-CM | POA: Diagnosis not present

## 2018-08-15 DIAGNOSIS — R69 Illness, unspecified: Secondary | ICD-10-CM | POA: Diagnosis not present

## 2018-08-20 ENCOUNTER — Other Ambulatory Visit: Payer: Self-pay | Admitting: *Deleted

## 2018-08-20 NOTE — Patient Outreach (Signed)
[facility]) Care Management  08/20/2018  RATASHA FABRE Jul 19, 1970 [id number]   Telephone Screening Date referral received: 08/14/18 Initial outreach: 08/20/18 Insurance: [employer]  Subjective:  Initial unsuccessful telephone call to patient's home/mobile number in order to complete [employer] referral assessment; no answer, left HIPAA compliant voicemail message requesting return call.   Objective:  Mrs. Rinke was seen in the emergency department at the [facility] in  [place] on 05/22/18 with a discharge diagnosis of gastritis, and again on 06/01/18 for strain of neck muscle.  She last saw her primary care provider on 03/20/18 for c/o of right hip pain and she had her annual wellness exam with her primary care provider on 10/10/17. No recent hospitalizations are seen in the electronic medical record.  Comorbidities include: hyperlipidemia with most recent lipid profile on 11/13/17 showing elevated total cholesterol of 212, and LDL of 139, her HDL was increased from 52 to 63 and her triglycerides were 49.  Her primary care provider messaged the patient to watch the fat and cholesterol in her diet, other comorbidities include history of bilateral pulmonary embolism, anxiety, orthostatic hypotension, depression, agoraphobia and cervical fusion in January of 2015.  There are no future visits scheduled with her primary care provider at this time per the electronic medical record.   Plan: This RNCM will route unsuccessful outreach letter with [place] pamphlet and 24 hour Nurse Advice Line Magnet to [facility] clinical pool to be mailed to patient's home address. This RNCM will attempt another outreach within 4 business days.  Barrington Ellison RN,CCM,CDE [place] Management Coordinator Office Phone [phone] Office Fax [phone]

## 2018-08-23 ENCOUNTER — Ambulatory Visit: Payer: Self-pay | Admitting: *Deleted

## 2018-08-23 ENCOUNTER — Other Ambulatory Visit: Payer: Self-pay | Admitting: *Deleted

## 2018-08-23 NOTE — Patient Outreach (Signed)
[facility]) Care Management  08/23/2018  Christina Blake 1970-11-25 [id number]  [name] at her mobile number, 2 HIPAA identifiers verified. Explained purpose of call- [employer] referral to discuss disease management services.  [name] states her company will be changing to a different insurance company on March 1st, 2020 so she will not be eligible for the services described. This RNCM thanked [name] for the information.  Will close case to [facility] Management services.  Barrington Ellison RN,CCM,CDE [place] Management Coordinator Office Phone [phone] Office Fax [phone]

## 2018-08-26 ENCOUNTER — Ambulatory Visit: Payer: Self-pay | Admitting: *Deleted

## 2018-12-16 ENCOUNTER — Encounter: Payer: Self-pay | Admitting: Family Medicine

## 2018-12-16 ENCOUNTER — Other Ambulatory Visit: Payer: Self-pay

## 2018-12-16 ENCOUNTER — Ambulatory Visit (INDEPENDENT_AMBULATORY_CARE_PROVIDER_SITE_OTHER): Payer: 59 | Admitting: Family Medicine

## 2018-12-16 DIAGNOSIS — M545 Low back pain: Secondary | ICD-10-CM | POA: Diagnosis not present

## 2018-12-16 DIAGNOSIS — S39012A Strain of muscle, fascia and tendon of lower back, initial encounter: Secondary | ICD-10-CM

## 2018-12-16 MED ORDER — METHOCARBAMOL 500 MG PO TABS: 500.0000 mg | ORAL_TABLET | Freq: Four times a day (QID) | ORAL | 2 refills | Status: DC | PRN

## 2018-12-16 MED ORDER — HYDROCODONE-ACETAMINOPHEN 5-325 MG PO TABS: ORAL_TABLET | ORAL | 0 refills | Status: DC

## 2018-12-16 MED ORDER — TRAMADOL HCL 50 MG PO TABS: 50.0000 mg | ORAL_TABLET | Freq: Every evening | ORAL | 2 refills | Status: DC | PRN

## 2018-12-16 NOTE — Progress Notes (Signed)
   Subjective:    Patient ID: Christina Blake, female    DOB: 02-Nov-1970, 48 y.o.   MRN: [id number] Audio only HPI Patient calls with back pain and muscle spasms for 2 days. No know injury. Patient has tried TENS unit and tylenol.  Worked over te weekend  Worse low back, across   [employer] motrin  Having true spasm   Uses ro  Virtual Visit via Video Note  I connected with Christina Blake on 12/16/18 at 11:00 AM EDT by a video enabled telemedicine application and verified that I am speaking with the correct person using two identifiers.  Location: Patient: home Provider: office   I discussed the limitations of evaluation and management by telemedicine and the availability of in person appointments. The patient expressed understanding and agreed to proceed.  History of Present Illness:    Observations/Objective:   Assessment and Plan:   Follow Up Instructions:    I discussed the assessment and treatment plan with the patient. The patient was provided an opportunity to ask questions and all were answered. The patient agreed with the plan and demonstrated an understanding of the instructions.   The patient was advised to call back or seek an in-person evaluation if the symptoms worsen or if the condition fails to improve as anticipated.  I provided 18 minutes of non-face-to-face time during this encounter.   Patient has been experiencing numerous bouts of back pain.  Will last well.  Generally associated with lifting heavy patients.  Low back diffuse in nature.  Minimal radiation into the legs.  Pain is quite severe at times..  This felt particularly painful.   Review of Systems No headache, no major weight loss or weight gain, no chest pain no back pain abdominal pain no change in bowel habits complete ROS otherwise negative     Objective:   Physical Exam   Verbal     Assessment & Plan:  Impression acute lumbar strain with elements of spasm.  Patient requests  Robaxin.  Also on motion.  Hydrocodone added.  Also request tramadol to use going forward at night during painful flares to help calm down her discomfort and assist with rest.

## 2019-02-05 ENCOUNTER — Other Ambulatory Visit: Payer: Self-pay

## 2019-02-05 ENCOUNTER — Ambulatory Visit (INDEPENDENT_AMBULATORY_CARE_PROVIDER_SITE_OTHER): Payer: 59 | Admitting: Family Medicine

## 2019-02-05 DIAGNOSIS — M545 Low back pain, unspecified: Secondary | ICD-10-CM

## 2019-02-05 DIAGNOSIS — S39012A Strain of muscle, fascia and tendon of lower back, initial encounter: Secondary | ICD-10-CM

## 2019-02-05 NOTE — Progress Notes (Signed)
   Subjective:  Audio  Patient ID: Christina Blake, female    DOB: 1970/12/23, 49 y.o.   MRN: [id number]  Back Pain This is a chronic problem. Pain location: middle-lower back. The pain does not radiate. Stiffness is present in the morning. Treatments tried: Robaxin; Percocet 5-325. The treatment provided moderate relief.    Virtual Visit via Video Note  I connected with Christina Blake on 02/05/19 at 10:00 AM EDT by a video enabled telemedicine application and verified that I am speaking with the correct person using two identifiers.  Location: Patient: home Provider: office   I discussed the limitations of evaluation and management by telemedicine and the availability of in person appointments. The patient expressed understanding and agreed to proceed.  History of Present Illness:    Observations/Objective:   Assessment and Plan:   Follow Up Instructions:    I discussed the assessment and treatment plan with the patient. The patient was provided an opportunity to ask questions and all were answered. The patient agreed with the plan and demonstrated an understanding of the instructions.   The patient was advised to call back or seek an in-person evaluation if the symptoms worsen or if the condition fails to improve as anticipated.  I provided 25 minutes of non-face-to-face time during this encounter.  Patient experiencing progressive back pain.  Fairly severe at times.  Primarily lumbar region.  Notes substantial radiation into the legs  Positive history of prior neurosurgical approach.  Describes the pain as a deep ache.  States something has to be done.  Vicente Males, LPN   Review of Systems  Musculoskeletal: Positive for back pain.   No rash no abdominal pain no change in bowel habits    Objective:   Physical Exam   Virtual     Assessment & Plan:  Impression progressive severe low back pain.  See prior notes.  Will utilize pain medicine as needed.   Patient declines physical therapy.  Referral to neurosurgeon.  Has seen Dr. Vertell Limber in the past for neuropathological pain.  X-rays ordered further recommendations based on results

## 2019-02-08 MED ORDER — HYDROCODONE-ACETAMINOPHEN 5-325 MG PO TABS: ORAL_TABLET | ORAL | 0 refills | Status: DC

## 2019-02-08 MED ORDER — TRAMADOL HCL 50 MG PO TABS: 50.0000 mg | ORAL_TABLET | Freq: Every evening | ORAL | 2 refills | Status: DC | PRN

## 2019-03-01 ENCOUNTER — Other Ambulatory Visit: Payer: Self-pay

## 2019-03-01 ENCOUNTER — Emergency Department (HOSPITAL_COMMUNITY): Payer: 59

## 2019-03-01 ENCOUNTER — Encounter (HOSPITAL_COMMUNITY): Payer: Self-pay | Admitting: Emergency Medicine

## 2019-03-01 ENCOUNTER — Emergency Department (HOSPITAL_COMMUNITY)
Admission: EM | Admit: 2019-03-01 | Discharge: 2019-03-01 | Disposition: A | Discharge status: Home / Self Care | Payer: 59 | Attending: Emergency Medicine | Admitting: Emergency Medicine

## 2019-03-01 DIAGNOSIS — Z79899 Other long term (current) drug therapy: Secondary | ICD-10-CM | POA: Diagnosis not present

## 2019-03-01 DIAGNOSIS — Y929 Unspecified place or not applicable: Secondary | ICD-10-CM | POA: Insufficient documentation

## 2019-03-01 DIAGNOSIS — F17210 Nicotine dependence, cigarettes, uncomplicated: Secondary | ICD-10-CM | POA: Insufficient documentation

## 2019-03-01 DIAGNOSIS — S098XXA Other specified injuries of head, initial encounter: Secondary | ICD-10-CM | POA: Diagnosis present

## 2019-03-01 DIAGNOSIS — W19XXXA Unspecified fall, initial encounter: Secondary | ICD-10-CM | POA: Diagnosis not present

## 2019-03-01 DIAGNOSIS — S0990XA Unspecified injury of head, initial encounter: Secondary | ICD-10-CM

## 2019-03-01 DIAGNOSIS — I951 Orthostatic hypotension: Secondary | ICD-10-CM

## 2019-03-01 DIAGNOSIS — Y999 Unspecified external cause status: Secondary | ICD-10-CM | POA: Diagnosis not present

## 2019-03-01 DIAGNOSIS — Y939 Activity, unspecified: Secondary | ICD-10-CM | POA: Insufficient documentation

## 2019-03-01 HISTORY — DX: Trochanteric bursitis, right hip: M70.62

## 2019-03-01 HISTORY — DX: Trochanteric bursitis, right hip: M70.61

## 2019-03-01 HISTORY — DX: Radiculopathy, cervical region: M54.12

## 2019-03-01 HISTORY — DX: Other chronic pain: G89.29

## 2019-03-01 LAB — CBC WITH DIFFERENTIAL/PLATELET
Abs Immature Granulocytes: 0.02 10*3/uL (ref 0.00–0.07)
Basophils Absolute: 0.1 10*3/uL (ref 0.0–0.1)
Basophils Relative: 1 %
Eosinophils Absolute: 0.1 10*3/uL (ref 0.0–0.5)
Eosinophils Relative: 1 %
HCT: 46.7 % — ABNORMAL HIGH (ref 36.0–46.0)
Hemoglobin: 15.2 g/dL — ABNORMAL HIGH (ref 12.0–15.0)
Immature Granulocytes: 0 %
Lymphocytes Relative: 20 %
Lymphs Abs: 1.4 10*3/uL (ref 0.7–4.0)
MCH: 31.5 pg (ref 26.0–34.0)
MCHC: 32.5 g/dL (ref 30.0–36.0)
MCV: 96.9 fL (ref 80.0–100.0)
Monocytes Absolute: 0.5 10*3/uL (ref 0.1–1.0)
Monocytes Relative: 8 %
Neutro Abs: 5 10*3/uL (ref 1.7–7.7)
Neutrophils Relative %: 70 %
Platelets: 203 10*3/uL (ref 150–400)
RBC: 4.82 MIL/uL (ref 3.87–5.11)
RDW: 12.6 % (ref 11.5–15.5)
WBC: 7.1 10*3/uL (ref 4.0–10.5)
nRBC: 0 % (ref 0.0–0.2)

## 2019-03-01 LAB — COMPREHENSIVE METABOLIC PANEL
ALT: 13 U/L (ref 0–44)
AST: 16 U/L (ref 15–41)
Albumin: 4.1 g/dL (ref 3.5–5.0)
Alkaline Phosphatase: 82 U/L (ref 38–126)
Anion gap: 6 (ref 5–15)
BUN: 10 mg/dL (ref 6–20)
CO2: 26 mmol/L (ref 22–32)
Calcium: 9.2 mg/dL (ref 8.9–10.3)
Chloride: 105 mmol/L (ref 98–111)
Creatinine, Ser: 0.95 mg/dL (ref 0.44–1.00)
GFR calc Af Amer: >60 mL/min (ref >60)
GFR calc non Af Amer: >60 mL/min (ref >60)
Glucose, Bld: 108 mg/dL — ABNORMAL HIGH (ref 70–99)
Potassium: 4.2 mmol/L (ref 3.5–5.1)
Sodium: 137 mmol/L (ref 135–145)
Total Bilirubin: 0.2 mg/dL — ABNORMAL LOW (ref 0.3–1.2)
Total Protein: 7.2 g/dL (ref 6.5–8.1)

## 2019-03-01 LAB — URINALYSIS, ROUTINE W REFLEX MICROSCOPIC
Bilirubin Urine: NEGATIVE
Glucose, UA: NEGATIVE mg/dL
Ketones, ur: NEGATIVE mg/dL
Leukocytes,Ua: NEGATIVE
Nitrite: NEGATIVE
Protein, ur: NEGATIVE mg/dL
Specific Gravity, Urine: 1.006 (ref 1.005–1.030)
pH: 5 (ref 5.0–8.0)

## 2019-03-01 LAB — LIPASE, BLOOD: Lipase: 28 U/L (ref 11–51)

## 2019-03-01 LAB — TROPONIN I (HIGH SENSITIVITY): Troponin I (High Sensitivity): <2 ng/L (ref <18)

## 2019-03-01 MED ORDER — IBUPROFEN 400 MG PO TABS
400.0000 mg | ORAL_TABLET | Freq: Once | ORAL | Status: AC
  Administered 2019-03-01: 14:00:00 400 mg via ORAL
  Filled 2019-03-01: qty 1

## 2019-03-01 MED ORDER — ACETAMINOPHEN 325 MG PO TABS
650.0000 mg | ORAL_TABLET | Freq: Once | ORAL | Status: AC
  Administered 2019-03-01: 650 mg via ORAL
  Filled 2019-03-01: qty 2

## 2019-03-01 MED ORDER — SODIUM CHLORIDE 0.9 % IV BOLUS
1000.0000 mL | Freq: Once | INTRAVENOUS | Status: AC
  Administered 2019-03-01: 1000 mL via INTRAVENOUS

## 2019-03-01 NOTE — ED Notes (Signed)
Pt refuses repeat Troponin level.

## 2019-03-01 NOTE — ED Provider Notes (Signed)
[facility] EMERGENCY DEPARTMENT Provider Note   CSN: [id number] Arrival date & time: 03/01/19  [id number]     History   Chief Complaint Chief Complaint  Patient presents with  . Headache    HPI Christina Blake is a 48 y.o. female.     HPI  Pt was seen at 1025. Per pt, c/o sudden onset and resolution of multiple intermittent episodes of "lightheadedness" for the past 1 week. Pt states she fell 1 week ago due to feeling lightheaded when she stood up from sitting to walk to the bathroom; hitting her head on the floor. Pt c/o head pain and neck "spasms" since the fall. Pt states she becomes lightheaded when she changes position only. States she has had frequent loose stools for the past several months, which is slowly resolving "after leaving my stressful job." Denies any recent changes in meds or new meds. Denies N/V, no black or blood in stools, no CP/palpitations, no SOB/cough, no abd pain, no back pain, no visual changes, no focal motor weakness, no tingling/numbness in extremities, no ataxia, no slurred speech, no facial droop.    Past Medical History:  Diagnosis Date  . Anxiety   . Cervical neuralgia   . Chronic back pain   . Depression   . History of agoraphobia 2007  . Hyperlipidemia   . Orthostatic hypotension   . Pulmonary embolism, bilateral ([place]) 2012  . Trochanteric bursitis of both hips     Patient Active Problem List   Diagnosis Date Noted  . Herniated cervical disc 09/04/2014  . Cervical neuralgia 07/28/2014  . Depression 11/03/2012  . Panic disorder with agoraphobia 11/03/2012    Past Surgical History:  Procedure Laterality Date  . ANTERIOR CERVICAL DECOMP/DISCECTOMY FUSION N/A 09/04/2014   Procedure: Cervical four-five Cervical six-seven Anterior cervical decompression/diskectomy,fusion with exploration of Cervical five-six Fusion (Removal of Helix plate);  Surgeon: Erline Levine, MD;  Location: [place] [facility];  Service: Neurosurgery;  Laterality: N/A;  . BACK  SURGERY    . C5 & C6 fusion  07/25/12  . L4,L5 & S1 fusion  07/23/12  . OSTEOTOMY     Left foot 2nd toe-[facility]  . Uterine ablation       OB History    Gravida  2   Para  2   Term  2   Preterm      AB      Living  2     SAB      TAB      Ectopic      Multiple      Live Births               Home Medications    Prior to Admission medications   Medication Sig Start Date End Date Taking? Authorizing Provider  ALPRAZolam Duanne Moron) 0.5 MG tablet Take 0.5 mg by mouth 3 (three) times daily as needed for anxiety.    [provider]  BLACK COHOSH PO Take 1 tablet by mouth daily. At bedtime    [provider]  Calcium Citrate-Vitamin D (CALCIUM + D PO) Take by mouth.    [provider]  dimenhyDRINATE (DRAMAMINE) 50 MG tablet Take 25 mg by mouth as needed.    [provider]  etodolac (LODINE) 400 MG tablet Take 1 tablet (400 mg total) by mouth 2 (two) times daily. Start AFTER prednisone Patient not taking: Reported on 02/05/2019 09/27/17   Mikey Kirschner, MD  HYDROcodone-acetaminophen (NORCO/VICODIN) 5-325 MG tablet Take  one tablet every 4 - 6 hours as needed for pain 02/08/19   Mikey Kirschner, MD  lamoTRIgine (LAMICTAL) 200 MG tablet Take 200 mg by mouth at bedtime.     [provider]  lurasidone (LATUDA) 40 MG TABS tablet Take 40 mg by mouth daily with breakfast.    [provider]  methocarbamol (ROBAXIN) 500 MG tablet Take 1-2 tablets (500-1,000 mg total) by mouth 4 (four) times daily as needed for muscle spasms. 12/16/18   Mikey Kirschner, MD  Multiple Vitamin (MULTIVITAMIN) capsule Take 1 capsule by mouth daily.    [provider]  Nutritional Supplements (ESTROVEN PO) Take by mouth.    [provider]  Nutritional Supplements (ESTROVEN PO) Take by mouth. One qhs    [provider]  ondansetron (ZOFRAN ODT) 4 MG disintegrating tablet Dissolve one every 6 hours as needed for nausea. 05/21/18    Mikey Kirschner, MD  PARoxetine (PAXIL-CR) 37.5 MG 24 hr tablet Take 75 mg by mouth daily.    [provider]  predniSONE (DELTASONE) 20 MG tablet Take three for 3 days, two for 3 days and then one for 3 days Patient not taking: Reported on 02/05/2019 09/27/17   Mikey Kirschner, MD  temazepam (RESTORIL) 15 MG capsule Take 15 mg by mouth at bedtime as needed for sleep.    [provider]  traMADol (ULTRAM) 50 MG tablet Take 1 tablet (50 mg total) by mouth at bedtime as needed. 02/08/19   Mikey Kirschner, MD  traZODone (DESYREL) 100 MG tablet Take 100 mg by mouth at bedtime.     [provider]    Family History Family History  Problem Relation Age of Onset  . Hypertension Mother   . Heart disease Mother 57       MI at 52   . Osteoporosis Mother   . Breast cancer Maternal Aunt   . Breast cancer Cousin        Maternal first cousin  . Anesthesia problems Neg Hx   . Hypotension Neg Hx   . Malignant hyperthermia Neg Hx   . Pseudochol deficiency Neg Hx     Social History Social History   Tobacco Use  . Smoking status: Current Every Day Smoker    Packs/day: 1.00    Years: 20.00    Pack years: 20.00    Types: Cigarettes  . Smokeless tobacco: Never Used  . Tobacco comment: stopped 07/10/15   Substance Use Topics  . Alcohol use: No    Alcohol/week: 0.0 standard drinks  . Drug use: No     Allergies   Patient has no known allergies.   Review of Systems Review of Systems ROS: Statement: All systems negative except as marked or noted in the HPI; Constitutional: Negative for fever and chills. ; ; Eyes: Negative for eye pain, redness and discharge. ; ; ENMT: Negative for ear pain, hoarseness, nasal congestion, sinus pressure and sore throat. ; ; Cardiovascular: Negative for chest pain, palpitations, diaphoresis, dyspnea and peripheral edema. ; ; Respiratory: Negative for cough, wheezing and stridor. ; ; Gastrointestinal: +loose stools. Negative for nausea,  vomiting, abdominal pain, blood in stool, hematemesis, jaundice and rectal bleeding. . ; ; Genitourinary: Negative for dysuria, flank pain and hematuria. ; ; Musculoskeletal: Negative for back pain. +neck pain. Negative for swelling and deformity.; ; Skin: Negative for pruritus, rash, abrasions, blisters, bruising and skin lesion.; ; Neuro: +lightheadedness. Negative for neck stiffness. Negative for weakness, altered level  of consciousness, altered mental status, extremity weakness, paresthesias, involuntary movement, seizure and syncope.      Physical Exam Updated Vital Signs BP 114/74 (BP Location: Right Arm)   Pulse 75   Temp 98.2 F (36.8 C) (Oral)   Resp 14   Ht 5\' 7"  (1.702 m)   Wt 81.6 kg   LMP 02/15/2019   SpO2 100%   BMI 28.19 kg/m    Patient Vitals for the past 24 hrs:  BP Temp Temp src Pulse Resp SpO2 Height Weight  03/01/19 1300 (!) 97/54 - - 62 14 99 % - -  03/01/19 1230 103/76 - - (!) 59 16 100 % - -  03/01/19 1200 102/73 - - - 15 - - -  03/01/19 1130 (!) 91/56 - - - 17 - - -  03/01/19 1115 - - - 62 18 98 % - -  03/01/19 1100 107/73 - - 67 16 98 % - -  03/01/19 1030 109/74 - - 72 12 100 % - -  03/01/19 1015 114/74 98.2 F (36.8 C) Oral 75 14 100 % - -  03/01/19 1010 - - - - - - 5\' 7"  (1.702 m) 81.6 kg     10:18:01 Orthostatic Vital Signs CB  Orthostatic Lying   BP- Lying: 104/68  Pulse- Lying: 74      Orthostatic Sitting  BP- Sitting: 113/79  Pulse- Sitting: 88      Orthostatic Standing at 0 minutes  BP- Standing at 0 minutes: 85/69Abnormal   Pulse- Standing at 0 minutes: 89     Physical Exam 1030: Physical examination:  Nursing notes reviewed; Vital signs and O2 SAT reviewed;  Constitutional: Well developed, Well nourished, Well hydrated, In no acute distress; Head:  Normocephalic, atraumatic; Eyes: EOMI, PERRL, No scleral icterus; ENMT: Mouth and pharynx normal, Mucous membranes moist; Neck: Supple, Full range of motion, No lymphadenopathy;  Cardiovascular: Regular rate and rhythm, No gallop; Respiratory: Breath sounds clear & equal bilaterally, No wheezes.  Speaking full sentences with ease, Normal respiratory effort/excursion; Chest: Nontender, Movement normal; Abdomen: Soft, Nontender, Nondistended, Normal bowel sounds; Genitourinary: No CVA tenderness; Extremities: Peripheral pulses normal, No tenderness, No edema, No calf edema or asymmetry.; Neuro: AA&Ox3, Major CN grossly intact. Speech clear.  No facial droop.  No nystagmus. Grips equal. Strength 5/5 equal bilat UE's and LE's.  DTR 2/4 equal bilat UE's and LE's.  No gross sensory deficits.  Normal cerebellar testing bilat UE's (finger-nose) and LE's (heel-shin). .; Skin: Color normal, Warm, Dry.   ED Treatments / Results  Labs (all labs ordered are listed, but only abnormal results are displayed)   EKG EKG Interpretation  Date/Time:  Saturday March 01 2019 10:17:23 EDT Ventricular Rate:  71 PR Interval:    QRS Duration: 90 QT Interval:  372 QTC Calculation: 405 R Axis:   90 Text Interpretation:  Sinus rhythm Probable left atrial enlargement Borderline right axis deviation Probable anteroseptal infarct, old Baseline wander When compared with ECG of 07/06/2015 No significant change was found Confirmed by Francine Graven [phone]) on 03/01/2019 10:37:12 AM   Radiology   Procedures Procedures (including critical care time)  Medications Ordered in ED Medications  sodium chloride 0.9 % bolus 1,000 mL (has no administration in time range)     Initial Impression / Assessment and Plan / ED Course  I have reviewed the triage vital signs and the nursing notes.  Pertinent labs & imaging results that were available during my care of the patient were reviewed by  me and considered in my medical decision making (see chart for details).     MDM Reviewed: previous chart, nursing note and vitals Reviewed previous: labs and ECG Interpretation: labs, ECG, CT scan and x-ray  Total time providing critical care: 30-74 minutes. This excludes time spent performing separately reportable procedures and services.   CRITICAL CARE Performed by: Francine Graven Total critical care time: 35 minutes Critical care time was exclusive of separately billable procedures and treating other patients. Critical care was necessary to treat or prevent imminent or life-threatening deterioration. Critical care was time spent personally by me on the following activities: development of treatment plan with patient and/or surrogate as well as nursing, discussions with consultants, evaluation of patient's response to treatment, examination of patient, obtaining history from patient or surrogate, ordering and performing treatments and interventions, ordering and review of laboratory studies, ordering and review of radiographic studies, pulse oximetry and re-evaluation of patient's condition.  Results for orders placed or performed during the hospital encounter of 03/01/19  Comprehensive metabolic panel  Result Value Ref Range   Sodium 137 135 - 145 mmol/L   Potassium 4.2 3.5 - 5.1 mmol/L   Chloride 105 98 - 111 mmol/L   CO2 26 22 - 32 mmol/L   Glucose, Bld 108 (H) 70 - 99 mg/dL   BUN 10 6 - 20 mg/dL   Creatinine, Ser 0.95 0.44 - 1.00 mg/dL   Calcium 9.2 8.9 - 10.3 mg/dL   Total Protein 7.2 6.5 - 8.1 g/dL   Albumin 4.1 3.5 - 5.0 g/dL   AST 16 15 - 41 U/L   ALT 13 0 - 44 U/L   Alkaline Phosphatase 82 38 - 126 U/L   Total Bilirubin 0.2 (L) 0.3 - 1.2 mg/dL   GFR calc non Af Amer >60 >60 mL/min   GFR calc Af Amer >60 >60 mL/min   Anion gap 6 5 - 15  Lipase, blood  Result Value Ref Range   Lipase 28 11 - 51 U/L  CBC with Differential  Result Value Ref Range   WBC 7.1 4.0 - 10.5 K/uL   RBC 4.82 3.87 - 5.11 MIL/uL   Hemoglobin 15.2 (H) 12.0 - 15.0 g/dL   HCT 46.7 (H) 36.0 - 46.0 %   MCV 96.9 80.0 - 100.0 fL   MCH 31.5 26.0 - 34.0 pg   MCHC 32.5 30.0 - 36.0 g/dL   RDW 12.6 11.5 -  15.5 %   Platelets 203 150 - 400 K/uL   nRBC 0.0 0.0 - 0.2 %   Neutrophils Relative % 70 %   Neutro Abs 5.0 1.7 - 7.7 K/uL   Lymphocytes Relative 20 %   Lymphs Abs 1.4 0.7 - 4.0 K/uL   Monocytes Relative 8 %   Monocytes Absolute 0.5 0.1 - 1.0 K/uL   Eosinophils Relative 1 %   Eosinophils Absolute 0.1 0.0 - 0.5 K/uL   Basophils Relative 1 %   Basophils Absolute 0.1 0.0 - 0.1 K/uL   Immature Granulocytes 0 %   Abs Immature Granulocytes 0.02 0.00 - 0.07 K/uL  Urinalysis, Routine w reflex microscopic  Result Value Ref Range   Color, Urine STRAW (A) YELLOW   APPearance CLEAR CLEAR   Specific Gravity, Urine 1.006 1.005 - 1.030   pH 5.0 5.0 - 8.0   Glucose, UA NEGATIVE NEGATIVE mg/dL   Hgb urine dipstick SMALL (A) NEGATIVE   Bilirubin Urine NEGATIVE NEGATIVE   Ketones, ur NEGATIVE NEGATIVE mg/dL   Protein, ur NEGATIVE  NEGATIVE mg/dL   Nitrite NEGATIVE NEGATIVE   Leukocytes,Ua NEGATIVE NEGATIVE   RBC / HPF 0-5 0 - 5 RBC/hpf   WBC, UA 0-5 0 - 5 WBC/hpf   Bacteria, UA FEW (A) NONE SEEN   Squamous Epithelial / LPF 6-10 0 - 5  Troponin I (High Sensitivity)  Result Value Ref Range   Troponin I (High Sensitivity) <2.0 <18 ng/L   Ct Head Wo Contrast Result Date: 03/01/2019 CLINICAL DATA:  Head trauma, fall last Sunday, posttraumatic headache and dizziness EXAM: CT HEAD WITHOUT CONTRAST CT CERVICAL SPINE WITHOUT CONTRAST TECHNIQUE: Multidetector CT imaging of the head and cervical spine was performed following the standard protocol without intravenous contrast. Multiplanar CT image reconstructions of the cervical spine were also generated. COMPARISON:  CT cervical spine, 06/01/2018 FINDINGS: CT HEAD FINDINGS Brain: No evidence of acute infarction, hemorrhage, hydrocephalus, extra-axial collection or mass lesion/mass effect. Vascular: No hyperdense vessel or unexpected calcification. Skull: Normal. Negative for fracture or focal lesion. Sinuses/Orbits: No acute finding. Other: None. CT CERVICAL  SPINE FINDINGS Alignment: Normal. Skull base and vertebrae: No acute fracture. No primary bone lesion or focal pathologic process. Soft tissues and spinal canal: No prevertebral fluid or swelling. No visible canal hematoma. Disc levels: Status post discectomy of C4 through C7 with anterior plate and screw fusion of C4-C5 and C6-C7, with bony incorporation of C4 through C7. Upper chest: Negative. Other: None. IMPRESSION: 1.  No acute intracranial pathology. 2. No fracture or static subluxation of the cervical spine. Status post discectomy of C4 through C7 with anterior plate and screw fusion of C4-C5 and C6-C7, with bony incorporation of C4 through C7. Electronically Signed   By: Eddie Candle M.D.   On: 03/01/2019 11:10   Ct Cervical Spine Wo Contrast Result Date: 03/01/2019 CLINICAL DATA:  Head trauma, fall last Sunday, posttraumatic headache and dizziness EXAM: CT HEAD WITHOUT CONTRAST CT CERVICAL SPINE WITHOUT CONTRAST TECHNIQUE: Multidetector CT imaging of the head and cervical spine was performed following the standard protocol without intravenous contrast. Multiplanar CT image reconstructions of the cervical spine were also generated. COMPARISON:  CT cervical spine, 06/01/2018 FINDINGS: CT HEAD FINDINGS Brain: No evidence of acute infarction, hemorrhage, hydrocephalus, extra-axial collection or mass lesion/mass effect. Vascular: No hyperdense vessel or unexpected calcification. Skull: Normal. Negative for fracture or focal lesion. Sinuses/Orbits: No acute finding. Other: None. CT CERVICAL SPINE FINDINGS Alignment: Normal. Skull base and vertebrae: No acute fracture. No primary bone lesion or focal pathologic process. Soft tissues and spinal canal: No prevertebral fluid or swelling. No visible canal hematoma. Disc levels: Status post discectomy of C4 through C7 with anterior plate and screw fusion of C4-C5 and C6-C7, with bony incorporation of C4 through C7. Upper chest: Negative. Other: None. IMPRESSION: 1.   No acute intracranial pathology. 2. No fracture or static subluxation of the cervical spine. Status post discectomy of C4 through C7 with anterior plate and screw fusion of C4-C5 and C6-C7, with bony incorporation of C4 through C7. Electronically Signed   By: Eddie Candle M.D.   On: 03/01/2019 11:10   Dg Abd Acute W/chest Result Date: 03/01/2019 CLINICAL DATA:  Diarrhea for several months. EXAM: DG ABDOMEN ACUTE W/ 1V CHEST COMPARISON:  None. FINDINGS: Phleboliths are seen in the pelvis. Surgical hardware seen in the lumbosacral spine. The chest, abdomen, and pelvis soft tissues and bones are otherwise unremarkable. IMPRESSION: Negative abdominal radiographs.  No acute cardiopulmonary disease. Electronically Signed   By: Dorise Bullion III M.D   On: 03/01/2019  11:26    Christina Blake was evaluated in Emergency Department on 03/01/2019 for the symptoms described in the history of present illness. She was evaluated in the context of the global COVID-19 pandemic, which necessitated consideration that the patient might be at risk for infection with the SARS-CoV-2 virus that causes COVID-19. Institutional protocols and algorithms that pertain to the evaluation of patients at risk for COVID-19 are in a state of rapid change based on information released by regulatory bodies including the CDC and federal and state organizations. These policies and algorithms were followed during the patient's care in the ED.    1315:  Pt orthostatic on VS. IVF NS 2L ordered. Pt does not want to stay for all the IVF infusion. Pt states she "feels better now" and wants to go home. Pt has ambulated with steady gait, easy resps, NAD. Pt has tol PO well without N/V. Workup reassuring. No clear indication for admission at this time. Dx and testing, as well as incidental finding(s), d/w pt and family.  Questions answered.  Verb understanding, agreeable to d/c home with outpt f/u.       Final Clinical Impressions(s) / ED  Diagnoses   Final diagnoses:  None    ED Discharge Orders    None       Francine Graven, DO 03/06/19 1528

## 2019-03-01 NOTE — Discharge Instructions (Signed)
Take over the counter tylenol and ibuprofen, as directed on packaging, as needed for headache. Increase your fluid intake (ie:  Gatoraide) for the next few days, as discussed. Move slowly when changing positions. Call your regular medical doctor Monday to schedule a follow up appointment this week.  Return to the Emergency Department immediately sooner if worsening.

## 2019-03-01 NOTE — ED Notes (Signed)
Pt ambulated in hallway without any difficulties. Denies dizziness.

## 2019-03-01 NOTE — ED Triage Notes (Addendum)
Patient c/o headache, dizziness, and neck pain that is getting progressively worse. Patient states that last Sunday she stood up to go to the bathroom and got dizzy causing her to fall and hit neck and head. Patient asked about hx of vertigo-patient states "I have self diagnosed orthostatic hypotension. I only get dizzy with position changes." Patient denies any nausea or vomiting. Unsure of LOC after fall. Patient reports hx of c-spine fusion. Per patient took x3 proct last night with no relief. Denies taking an type of anticoagulants.

## 2019-03-01 NOTE — ED Notes (Signed)
Pt out of bed,  Ambulated to BR

## 2019-03-03 LAB — URINE CULTURE

## 2019-03-05 ENCOUNTER — Encounter: Payer: 59 | Admitting: Family Medicine

## 2019-03-13 ENCOUNTER — Ambulatory Visit (INDEPENDENT_AMBULATORY_CARE_PROVIDER_SITE_OTHER): Payer: 59 | Admitting: Family Medicine

## 2019-03-13 ENCOUNTER — Other Ambulatory Visit: Payer: Self-pay

## 2019-03-13 ENCOUNTER — Encounter: Payer: Self-pay | Admitting: Family Medicine

## 2019-03-13 DIAGNOSIS — R197 Diarrhea, unspecified: Secondary | ICD-10-CM | POA: Diagnosis not present

## 2019-03-13 NOTE — Progress Notes (Signed)
   Subjective:  Audio  Patient ID: Christina Blake, female    DOB: 25-Feb-1971, 48 y.o.   MRN: [id number]  HPI   Patient calls to discuss issues with incontinent bowels. Patient states it has been going on off and on for a few months.  Virtual Visit via Video Note  I connected with Warren Danes on 03/13/19 at  9:30 AM EDT by a video enabled telemedicine application and verified that I am speaking with the correct person using two identifiers.  Location: Patient: home Provider: office   I discussed the limitations of evaluation and management by telemedicine and the availability of in person appointments. The patient expressed understanding and agreed to proceed.  History of Present Illness:    Observations/Objective:   Assessment and Plan:   Follow Up Instructions:    I discussed the assessment and treatment plan with the patient. The patient was provided an opportunity to ask questions and all were answered. The patient agreed with the plan and demonstrated an understanding of the instructions.   The patient was advised to call back or seek an in-person evaluation if the symptoms worsen or if the condition fails to improve as anticipated.  I provided 18 minutes of non-face-to-face time during this encounter. Patient describes intermittent incontinence now for nearly 9 months.  Always associated with very loose stools.  No abdominal pain.  No abdominal tenderness.  No fever.  No noticeable blood in stool.  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  No history of rectal tone difficulties.  Positive neurosurgical history but not involving rectal region   Review of Systems  No headache, no major weight loss or weight gain, no chest pain no back pain abdominal pain no change in bowel habits complete ROS otherwise negative     Objective:   Physical Exam    Virtual    Assessment & Plan:  Impression chronic diarrhea with  intermittent stool incontinence.  Discussion held.  We will do stool studies plus GI referral  2.  Patient mention toward end of visit recent ER visit.  Reviewed that shows a fall with some orthostatic symptoms.  Review of medicine shows many medications which can potentially contribute as prescribed by her mental health provider.  Avoidance measures discussed if persists or worsen would recommend discussing with mental health

## 2019-03-17 ENCOUNTER — Other Ambulatory Visit: Payer: Self-pay | Admitting: Family Medicine

## 2019-03-26 ENCOUNTER — Encounter: Payer: Self-pay | Admitting: Family Medicine

## 2019-04-08 ENCOUNTER — Other Ambulatory Visit: Payer: Self-pay | Admitting: *Deleted

## 2019-04-08 DIAGNOSIS — Z20822 Contact with and (suspected) exposure to covid-19: Secondary | ICD-10-CM

## 2019-04-11 LAB — NOVEL CORONAVIRUS, NAA: SARS-CoV-2, NAA: NOT DETECTED

## 2019-05-08 ENCOUNTER — Ambulatory Visit (INDEPENDENT_AMBULATORY_CARE_PROVIDER_SITE_OTHER): Payer: 59 | Admitting: Nurse Practitioner

## 2019-05-09 ENCOUNTER — Encounter: Payer: Self-pay | Admitting: Nurse Practitioner

## 2019-07-18 DIAGNOSIS — Z23 Encounter for immunization: Secondary | ICD-10-CM | POA: Diagnosis not present

## 2019-08-06 ENCOUNTER — Encounter: Payer: Self-pay | Admitting: Family Medicine

## 2019-08-07 ENCOUNTER — Telehealth: Payer: Self-pay | Admitting: Family Medicine

## 2019-08-07 NOTE — Telephone Encounter (Signed)
Pt contacted and verbalized understanding.  

## 2019-08-07 NOTE — Telephone Encounter (Signed)
Pt states she was exposed to a +Covid case on Thursday 07/31/2019 & she had a negative rapid test on Monday 08/04/2019  She is not having any symptoms.  She's wondering how many days should she quarantine?  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

## 2019-08-07 NOTE — Telephone Encounter (Signed)
If true exposure they are now saying ten days, if pt gets any symtoms she should not trust the rapid result alone, but get a standard send off pcr test

## 2019-08-15 DIAGNOSIS — Z23 Encounter for immunization: Secondary | ICD-10-CM | POA: Diagnosis not present

## 2019-09-26 DIAGNOSIS — F411 Generalized anxiety disorder: Secondary | ICD-10-CM | POA: Diagnosis not present

## 2019-09-26 DIAGNOSIS — F3181 Bipolar II disorder: Secondary | ICD-10-CM | POA: Diagnosis not present

## 2019-10-20 ENCOUNTER — Other Ambulatory Visit: Payer: Self-pay

## 2019-10-20 ENCOUNTER — Ambulatory Visit: Payer: BLUE CROSS/BLUE SHIELD | Admitting: Family Medicine

## 2019-10-20 ENCOUNTER — Encounter: Payer: Self-pay | Admitting: Family Medicine

## 2019-10-20 VITALS — BP 132/80 | Temp 97.9°F | Ht 67.0 in | Wt 190.8 lb

## 2019-10-20 DIAGNOSIS — M545 Low back pain, unspecified: Secondary | ICD-10-CM

## 2019-10-20 DIAGNOSIS — S39012A Strain of muscle, fascia and tendon of lower back, initial encounter: Secondary | ICD-10-CM

## 2019-10-20 MED ORDER — ACYCLOVIR 800 MG PO TABS: ORAL_TABLET | ORAL | 0 refills | Status: DC

## 2019-10-20 MED ORDER — AMOXICILLIN-POT CLAVULANATE 875-125 MG PO TABS: 1.0000 | ORAL_TABLET | Freq: Two times a day (BID) | ORAL | 0 refills | Status: DC

## 2019-10-20 MED ORDER — HYDROCODONE-ACETAMINOPHEN 5-325 MG PO TABS: ORAL_TABLET | ORAL | 0 refills | Status: DC

## 2019-10-20 MED ORDER — METHOCARBAMOL 500 MG PO TABS: 500.0000 mg | ORAL_TABLET | Freq: Three times a day (TID) | ORAL | 0 refills | Status: DC

## 2019-10-20 NOTE — Progress Notes (Signed)
   Subjective:    Patient ID: Christina Blake, female    DOB: 12/14/1970, 49 y.o.   MRN: [id number]  Back Pain This is a new problem. Episode onset: a few months. The pain is present in the thoracic spine (neck). Treatments tried: tylenol, motrin, bayer back and body.   Not having to do heay lifting   Pain is fairly severe between the sholder blades  commmes and goes   During a rough day takes motrin or tylenol, sonetie s other prn meds  Pt has hx of two cerv  Fusions and l 4 l5 fusion    Patient also notes some congestion drainage stuffiness and drainage feels like a sinus infection coming on.  No fever no chills no cough  Also uses acyclovir as needed for fever blisters would like a refill   Review of Systems  Musculoskeletal: Positive for back pain.       Objective:   Physical Exam Alert active good hydration HEENT moderate nasal congestion.  Lungs clear heart regular rate rhythm positive paraspinal tenderness thorax left greater than right lumbar region also.  Negative straight leg raise.       Assessment & Plan:  Impression 1 flare of chronic back pain.  Add hydrocodone as needed for severe pain.  Robaxin as needed for spasm.  Patient due to see her neurosurgeon next week so hold off on imaging.  We had ordered x-rays last fall which she did not do secondary to no insurance.  2.  Rhinosinusitis discussed antibiotics prescribed.  Along with acyclovir as needed  Local exercises discussed and encouraged also

## 2019-10-29 DIAGNOSIS — F3181 Bipolar II disorder: Secondary | ICD-10-CM | POA: Diagnosis not present

## 2019-10-29 DIAGNOSIS — F411 Generalized anxiety disorder: Secondary | ICD-10-CM | POA: Diagnosis not present

## 2019-11-10 DIAGNOSIS — F411 Generalized anxiety disorder: Secondary | ICD-10-CM | POA: Diagnosis not present

## 2019-11-10 DIAGNOSIS — F3181 Bipolar II disorder: Secondary | ICD-10-CM | POA: Diagnosis not present

## 2019-11-18 ENCOUNTER — Other Ambulatory Visit: Payer: Self-pay | Admitting: Family Medicine

## 2019-11-18 NOTE — Telephone Encounter (Signed)
May ref all times one no ref

## 2019-12-23 DIAGNOSIS — F3181 Bipolar II disorder: Secondary | ICD-10-CM | POA: Diagnosis not present

## 2019-12-23 DIAGNOSIS — F411 Generalized anxiety disorder: Secondary | ICD-10-CM | POA: Diagnosis not present

## 2020-01-08 ENCOUNTER — Other Ambulatory Visit: Payer: Self-pay | Admitting: Family Medicine

## 2020-01-09 ENCOUNTER — Other Ambulatory Visit: Payer: Self-pay | Admitting: Family Medicine

## 2020-01-26 DIAGNOSIS — F411 Generalized anxiety disorder: Secondary | ICD-10-CM | POA: Diagnosis not present

## 2020-01-26 DIAGNOSIS — F3181 Bipolar II disorder: Secondary | ICD-10-CM | POA: Diagnosis not present

## 2020-01-27 DIAGNOSIS — F4311 Post-traumatic stress disorder, acute: Secondary | ICD-10-CM | POA: Diagnosis not present

## 2020-01-27 DIAGNOSIS — F411 Generalized anxiety disorder: Secondary | ICD-10-CM | POA: Diagnosis not present

## 2020-01-27 DIAGNOSIS — F3181 Bipolar II disorder: Secondary | ICD-10-CM | POA: Diagnosis not present

## 2020-04-21 ENCOUNTER — Other Ambulatory Visit: Payer: Self-pay | Admitting: Family Medicine

## 2020-05-13 ENCOUNTER — Telehealth: Payer: Self-pay

## 2020-05-13 DIAGNOSIS — Z13220 Encounter for screening for lipoid disorders: Secondary | ICD-10-CM

## 2020-05-13 DIAGNOSIS — Z79899 Other long term (current) drug therapy: Secondary | ICD-10-CM

## 2020-05-13 DIAGNOSIS — R5383 Other fatigue: Secondary | ICD-10-CM

## 2020-05-13 NOTE — Telephone Encounter (Signed)
Last routine labs 11/13/17 - lipid, liver, bmp, vit d, tsh

## 2020-05-13 NOTE — Telephone Encounter (Signed)
Patient has appointment on 12/17 for physical and needing labs

## 2020-05-14 NOTE — Telephone Encounter (Signed)
Please reorder same labs. Thanks.

## 2020-05-14 NOTE — Telephone Encounter (Signed)
Blood work ordered in Epic. Patient notified. 

## 2020-05-31 ENCOUNTER — Encounter: Payer: Self-pay | Admitting: Family Medicine

## 2020-05-31 ENCOUNTER — Telehealth: Payer: Self-pay | Admitting: Family Medicine

## 2020-05-31 ENCOUNTER — Other Ambulatory Visit: Payer: Self-pay

## 2020-05-31 ENCOUNTER — Telehealth (INDEPENDENT_AMBULATORY_CARE_PROVIDER_SITE_OTHER): Payer: BLUE CROSS/BLUE SHIELD | Admitting: Family Medicine

## 2020-05-31 DIAGNOSIS — K529 Noninfective gastroenteritis and colitis, unspecified: Secondary | ICD-10-CM

## 2020-05-31 MED ORDER — PROMETHAZINE HCL 25 MG PO TABS: 25.0000 mg | ORAL_TABLET | Freq: Three times a day (TID) | ORAL | 0 refills | Status: DC | PRN

## 2020-05-31 NOTE — Telephone Encounter (Signed)
[name], hentges are scheduled for a virtual visit with your provider today.    Just as we do with appointments in the office, we must obtain your consent to participate.  Your consent will be active for this visit and any virtual visit you may have with one of our providers in the next 365 days.    If you have a MyChart account, I can also send a copy of this consent to you electronically.  All virtual visits are billed to your insurance company just like a traditional visit in the office.  As this is a virtual visit, video technology does not allow for your provider to perform a traditional examination.  This may limit your provider's ability to fully assess your condition.  If your provider identifies any concerns that need to be evaluated in person or the need to arrange testing such as labs, EKG, etc, we will make arrangements to do so.    Although advances in technology are sophisticated, we cannot ensure that it will always work on either your end or our end.  If the connection with a video visit is poor, we may have to switch to a telephone visit.  With either a video or telephone visit, we are not always able to ensure that we have a secure connection.   I need to obtain your verbal consent now.   Are you willing to proceed with your visit today?   Christina Blake has provided verbal consent on 05/31/2020 for a virtual visit (video or telephone).   Vicente Males, LPN [id number]  36:62 AM

## 2020-05-31 NOTE — Progress Notes (Signed)
Virtual Visit via Telephone Note  I connected with Christina Blake on 05/31/20 at  1:10 PM EST by telephone and verified that I am speaking with the correct person using two identifiers.  Location: Patient: home Provider: office   I discussed the limitations, risks, security and privacy concerns of performing an evaluation and management service by telephone and the availability of in person appointments. I also discussed with the patient that there may be a patient responsible charge related to this service. The patient expressed understanding and agreed to proceed.      Patient ID: Christina Blake, female    DOB: [birth year], 49 y.o.   MRN: [id number]   Chief Complaint  Patient presents with  . Emesis   Subjective:    HPI Pt having nausea/vomiting/ diarrhea. Began 4 days ago. Has taken Pepto but has not helped with the nausea.  No further diarrhea today.  Last episode of vomiting was last night.  Having persistent nausea.  Has some generalized pain in abdomen.  No fever or blood stools.   Pt is a home health aide.  Went to pt house last week 5 days ago and they had diarrhea and same symptoms.  Now her and the other CNA are both sick with vomiting and diarrhea.  Has been able to keep fluids down today and ate small amt of bread today.   Medical History Christina Blake has a past medical history of Anxiety, Cervical neuralgia, Chronic back pain, Depression, History of agoraphobia (2007), Hyperlipidemia, Orthostatic hypotension, Pulmonary embolism, bilateral ([place]) (2012), and Trochanteric bursitis of both hips.   Outpatient Encounter Medications as of 05/31/2020  Medication Sig  . acyclovir (ZOVIRAX) 800 MG tablet Take two po bid. Use as directed  . ALPRAZolam (XANAX) 0.5 MG tablet Take 0.5 mg by mouth 3 (three) times daily as needed for anxiety.  . Calcium Citrate-Vitamin D (CALCIUM + D PO) Take by mouth.  . dimenhyDRINATE (DRAMAMINE) 50 MG tablet Take 25 mg by mouth as needed.    . lamoTRIgine (LAMICTAL) 200 MG tablet Take 200 mg by mouth at bedtime.   Marland Kitchen LATUDA 60 MG TABS Take 1 tablet by mouth daily.  . methocarbamol (ROBAXIN) 500 MG tablet Take 1-2 tablets (500-1,000 mg total) by mouth 4 (four) times daily as needed for muscle spasms.  . Multiple Vitamin (MULTIVITAMIN) capsule Take 1 capsule by mouth daily.  . Nutritional Supplements (ESTROVEN PO) Take by mouth.  Marland Kitchen PARoxetine (PAXIL-CR) 37.5 MG 24 hr tablet Take 75 mg by mouth daily.  . temazepam (RESTORIL) 30 MG capsule Take 1 capsule by mouth at bedtime.  . traZODone (DESYREL) 100 MG tablet Take 100 mg by mouth at bedtime.   . promethazine (PHENERGAN) 25 MG tablet Take 1 tablet (25 mg total) by mouth every 8 (eight) hours as needed for nausea or vomiting.  . [DISCONTINUED] amoxicillin-clavulanate (AUGMENTIN) 875-125 MG tablet Take 1 tablet by mouth 2 (two) times daily.  . [DISCONTINUED] methocarbamol (ROBAXIN) 500 MG tablet TAKE 1 TABLET BY MOUTH 3 TIMES DAILY.   Facility-Administered Encounter Medications as of 05/31/2020  Medication  . methylPREDNISolone acetate (DEPO-MEDROL) injection 40 mg     Review of Systems  Constitutional: Negative for chills and fever.  HENT: Negative for congestion, rhinorrhea and sore throat.   Respiratory: Negative for cough, shortness of breath and wheezing.   Cardiovascular: Negative for chest pain and leg swelling.  Gastrointestinal: Positive for abdominal pain (generalized), diarrhea (improved), nausea and vomiting (resolved).  Genitourinary: Negative for dysuria  and frequency.  Musculoskeletal: Negative for arthralgias and back pain.  Skin: Negative for rash.  Neurological: Negative for dizziness, weakness and headaches.     Vitals There were no vitals taken for this visit.  Objective:   Physical Exam  No PE due to phone visit.  Assessment and Plan   1. AGE (acute gastroenteritis) - promethazine (PHENERGAN) 25 MG tablet; Take 1 tablet (25 mg total) by mouth  every 8 (eight) hours as needed for nausea or vomiting.  Dispense: 15 tablet; Refill: 0   Diarrhea and vomiting- improving. Pt requesting phenergan for nausea.  Stating it works better than zofran for her.  Pt to continue with pepto as needed and may add immodium if needed.    Call or rto if not improving in next 1-2 days, if pain worsening, fever, or blood in stool.  Cont to hydrate and eat BRAT diet.   Make sure urination every 2-3 hrs.  F/u prn.    Follow Up Instructions:    I discussed the assessment and treatment plan with the patient. The patient was provided an opportunity to ask questions and all were answered. The patient agreed with the plan and demonstrated an understanding of the instructions.   The patient was advised to call back or seek an in-person evaluation if the symptoms worsen or if the condition fails to improve as anticipated.  I provided 15 minutes of non-face-to-face time during this encounter.

## 2020-06-18 ENCOUNTER — Telehealth: Payer: Self-pay | Admitting: *Deleted

## 2020-06-18 ENCOUNTER — Other Ambulatory Visit: Payer: Self-pay

## 2020-06-18 ENCOUNTER — Ambulatory Visit (INDEPENDENT_AMBULATORY_CARE_PROVIDER_SITE_OTHER): Payer: BLUE CROSS/BLUE SHIELD | Admitting: Nurse Practitioner

## 2020-06-18 ENCOUNTER — Encounter: Payer: Self-pay | Admitting: Nurse Practitioner

## 2020-06-18 VITALS — BP 122/84 | HR 105 | Temp 96.9°F | Ht 67.0 in | Wt 177.8 lb

## 2020-06-18 DIAGNOSIS — N920 Excessive and frequent menstruation with regular cycle: Secondary | ICD-10-CM

## 2020-06-18 DIAGNOSIS — Z13220 Encounter for screening for lipoid disorders: Secondary | ICD-10-CM

## 2020-06-18 DIAGNOSIS — R32 Unspecified urinary incontinence: Secondary | ICD-10-CM

## 2020-06-18 DIAGNOSIS — Z79899 Other long term (current) drug therapy: Secondary | ICD-10-CM | POA: Diagnosis not present

## 2020-06-18 DIAGNOSIS — Z0000 Encounter for general adult medical examination without abnormal findings: Secondary | ICD-10-CM

## 2020-06-18 DIAGNOSIS — M545 Low back pain, unspecified: Secondary | ICD-10-CM

## 2020-06-18 DIAGNOSIS — R5383 Other fatigue: Secondary | ICD-10-CM

## 2020-06-18 DIAGNOSIS — Z1321 Encounter for screening for nutritional disorder: Secondary | ICD-10-CM

## 2020-06-18 DIAGNOSIS — Z1211 Encounter for screening for malignant neoplasm of colon: Secondary | ICD-10-CM

## 2020-06-18 DIAGNOSIS — Z01419 Encounter for gynecological examination (general) (routine) without abnormal findings: Secondary | ICD-10-CM

## 2020-06-18 DIAGNOSIS — Z122 Encounter for screening for malignant neoplasm of respiratory organs: Secondary | ICD-10-CM

## 2020-06-18 MED ORDER — METHOCARBAMOL 500 MG PO TABS: ORAL_TABLET | ORAL | 2 refills | Status: DC

## 2020-06-18 MED ORDER — TRAMADOL HCL 50 MG PO TABS: 50.0000 mg | ORAL_TABLET | Freq: Four times a day (QID) | ORAL | 0 refills | Status: DC | PRN

## 2020-06-18 MED ORDER — NORETHINDRONE 0.35 MG PO TABS: 1.0000 | ORAL_TABLET | Freq: Every day | ORAL | 11 refills | Status: DC

## 2020-06-18 NOTE — Telephone Encounter (Signed)
[name]:  Christina Blake is eligible for low dose CT of lungs, can you please let her know this. Also let her know that we will hold on the Chantix for now while were managing her other symptoms. We will try it in the future.   Lmtc.

## 2020-06-18 NOTE — Progress Notes (Signed)
Subjective:    Patient ID: Christina Blake, female    DOB: 25-Mar-1971, 49 y.o.   MRN: [id number]  HPI The patient comes in today for a wellness visit.  A review of their health history was completed. A review of medications was also completed.  Any needed refills; Ultram prn-1 at bedtime ; would like refills   Eating habits:  Healthy   Falls/  MVA accidents in past few months: one fall in the past year due to BP  Regular exercise: none  Specialist pt sees on regular basis: Dr.Polus -[facility]   Preventative health issues were discussed. Seeing eye doctor and dentist regularly.   Pt is not sexually active at this time, denies any concern for STI at this time. Denies the use of alcohol or recreational drug use. Current smoker 30 pack year.  Additional concerns: incontinence sometimes at night- going on for about 6 months.    Review of Systems General: Denies fever, HA, or malaise HENT: Denies changes in hearing or vision.  CV: Denies chest pain, palpitations, tachycardia Pulm: Denies cough, SOB, or wheezing GI/GU: Denies upset stomach, nausea, vomiting, or constipation. No overt GERD symptoms. Is having intermittent diarrhea 1x/week. This has continued from her recent gastroenteritis. Denies blood or change in the color of her stools. The other stools are described as normal.  Urinary: has some stress and urge incontinence. Has had 3-4 episodes of uncontrollable incontinence some at night where she cannot stop the urination. Denies any pelvic pain.  MSK: Continues to have back pain daily. Reports taking Ultram (Tramadol) in the past, this was helpful.       PHQ9 SCORE ONLY 06/18/2020 10/10/2017  PHQ-9 Total Score 7 9   GAD 7 : Generalized Anxiety Score 06/18/2020  Nervous, Anxious, on Edge 1  Control/stop worrying 1  Worry too much - different things 1  Trouble relaxing 2  Restless 2  Easily annoyed or irritable 2  Afraid - awful might happen 0  Total GAD 7 Score 9   Anxiety Difficulty Somewhat difficult      Objective:   Physical Exam General: Patient is alert, well groomed, making good eye contact, with coherent thought and judgement.  CV: Hearts sounds are normal without murmur or gallop. Regular rate and rhythm. Skin is warm and dry without swelling in the extremities  Pulm: Breath sounds are equal and clear bilaterally without wheezing or rhonchi.   GI/GU: Abdomen is flat and soft without mass, tenderness. No rebound or guarding.   External GU: pink without erythema, rash or lesions. Vagina is pink without rash, lesions, discharge or erythema. No CMT. Bimanual exam no obvious mass or tenderness. No cystocele noted.   Chest: Breast are symmetrical without erythema, mass, swelling, tenderness, nipple change or discharge. No axillary or pectoral lymphadenopathy.  Skin: Warm dry and intact. Significant sun damage noted. Small circular patches of hypopigmentation noted to the posterior neck and upper to mid back.   Neck: Thyroid is without mass, thyromegaly, or tenderness. No cervical adenopathy.  Today's Vitals   06/18/20 0835  BP: 122/84  Pulse: (!) 105  Temp: (!) 96.9 F (36.1 C)  SpO2: 98%  Weight: 80.6 kg  Height: 5\' 7"  (1.702 m)   Body mass index is 27.85 kg/m.     Assessment & Plan:   Problem List Items Addressed This Visit      Other   Menorrhagia with regular cycle   Urinary incontinence   Relevant Orders   Ambulatory referral to  Urology    Other Visit Diagnoses    Well woman exam    -  Primary   Relevant Orders   CBC with Differential   TSH   VITAMIN D 25 Hydroxy (Vit-D Deficiency, Fractures)   Hepatic function panel   Basic Metabolic Panel (BMET)   Lipid Profile   Low back pain, unspecified back pain laterality, unspecified chronicity, unspecified whether sciatica present       Relevant Medications   methocarbamol (ROBAXIN) 500 MG tablet   traMADol (ULTRAM) 50 MG tablet   Other Relevant Orders   CBC with  Differential   TSH   VITAMIN D 25 Hydroxy (Vit-D Deficiency, Fractures)   Hepatic function panel   Basic Metabolic Panel (BMET)   Lipid Profile   Screening, lipid       Relevant Orders   CBC with Differential   TSH   VITAMIN D 25 Hydroxy (Vit-D Deficiency, Fractures)   Hepatic function panel   Basic Metabolic Panel (BMET)   Lipid Profile   High risk medication use       Relevant Orders   CBC with Differential   TSH   VITAMIN D 25 Hydroxy (Vit-D Deficiency, Fractures)   Hepatic function panel   Basic Metabolic Panel (BMET)   Lipid Profile   Encounter for vitamin deficiency screening       Relevant Orders   CBC with Differential   TSH   VITAMIN D 25 Hydroxy (Vit-D Deficiency, Fractures)   Hepatic function panel   Basic Metabolic Panel (BMET)   Lipid Profile   Fatigue, unspecified type       Relevant Orders   CBC with Differential   TSH   VITAMIN D 25 Hydroxy (Vit-D Deficiency, Fractures)   Hepatic function panel   Basic Metabolic Panel (BMET)   Lipid Profile   Screening for colon cancer       Relevant Orders   Ambulatory referral to Gastroenterology   Screening for lung cancer       Relevant Orders   Ambulatory referral to Pulmonology        Meds ordered this encounter  Medications  . methocarbamol (ROBAXIN) 500 MG tablet    Sig: Take 1-2 tab, PO 4 times daily PRN muscle spasms    Dispense:  40 tablet    Refill:  2  . traMADol (ULTRAM) 50 MG tablet    Sig: Take 1 tablet (50 mg total) by mouth every 6 (six) hours as needed. For pain    Dispense:  30 tablet    Refill:  0  . norethindrone (MICRONOR) 0.35 MG tablet    Sig: Take 1 tablet (0.35 mg total) by mouth daily.    Dispense:  28 tablet    Refill:  11    Referral for Urology placed, If you experience total loss of bowel or bladder control, please seek immediate medical attention.  Discussed starting probiotics for her intermittent diarrhea. Referred for routine colon cancer screening.  Encouraged  patient to consider skin cancer screening Discussed starting annual low dose CT of lungs for lung cancer screening. Referred to pulmonology. Patient expresses desire to stop smoking, would like to try Chantix in the future. Priority will be to get her menstrual bleeding under control. Start Micronor as directed. Take daily. Call back if bleeding persists after 3 months of therapy, sooner if heavy or prolonged bleeding.  Return in about 1 year (around 06/18/2021).

## 2020-06-18 NOTE — Patient Instructions (Addendum)
Try some probiotics such as the tablet Align, or if you prefer, you could try yogurt with active probiotics Christina Blake). This could help with the intermittent diarrhea    Probiotics Probiotics are the good bacteria and yeasts that live in your body and keep your digestive system healthy. Probiotics also help your body's defense system (immune system) and protect your body against the growth of harmful bacteria. Your health care provider may recommend taking a probiotic if you are taking antibiotics or have certain medical conditions, such as:  Diarrhea.  Constipation.  Irritable bowel syndrome.  Lung infections.  Yeast infections.  Acne, eczema, and other skin conditions.  Frequent urinary tract infections. What affects the balance of bacteria in my body? The balance of good bacteria in your body can be affected by:  Antibiotic medicines. These medicines treat infections caused by bacteria. Unfortunately, they may kill the good bacteria in your body as well as the bad bacteria.  Certain medical conditions. Conditions related to an imbalance of bacteria include: ? Stomach and intestine (gastrointestinal) infections. ? Lung infections. ? Skin infections. ? Vaginal infections. ? Inflammatory bowel diseases. ? Stomach ulcers (gastric ulcers). ? Tooth decay and gum disease (periodontal disease).  Stress.  Poor diet. What type of probiotic is right for me? Probiotics contain different types of bacteria (strains). Strains commonly found in probiotics include:  Lactobacillus.  Saccharomyces.  Bifidobacterium. Specific strains have been shown to be more effective for certain health conditions. Ask your health care provider which strain or strains you should use and how often. Probiotics come in many different forms, strain combinations, and strengths. Some may need to be refrigerated. Always read the label for storage and usage instructions. Certain foods, such as yogurt, contain  probiotics. Probiotics can also be bought as a supplement at a pharmacy, health food store, or grocery store. Talk to your health care provider before starting any supplement. What are the side effects of probiotics? Some people have side effects when taking probiotics. Side effects are usually temporary and may include:  Gas.  Bloating.  Cramping. Serious side effects are rare. Follow these instructions at home:   If you are taking probiotics with antibiotics: ? Wait at least 2 hours between taking your medicine and the probiotic. ? Eat foods high in fiber, such as whole grains, beans, and vegetables. These foods can help good bacteria grow. ? Avoid certain foods as told by your health care provider. Summary  Probiotics are the good bacteria and yeasts that live in your body and keep you and your digestive system healthy.  Certain foods, such as yogurt, contain probiotics.  Probiotics can be taken as supplements. They can be bought at a pharmacy, health food store, or grocery store. They come in many different forms, strain combinations, and strengths.  Be sure to talk with your health care provider before taking a probiotic supplement. This information is not intended to replace advice given to you by your health care provider. Make sure you discuss any questions you have with your health care provider. Document Revised: 03/08/2018 Document Reviewed: 07/04/2017 Elsevier Patient Education  2020 [employer].

## 2020-06-20 ENCOUNTER — Encounter: Payer: Self-pay | Admitting: Nurse Practitioner

## 2020-06-20 DIAGNOSIS — R32 Unspecified urinary incontinence: Secondary | ICD-10-CM | POA: Insufficient documentation

## 2020-06-20 DIAGNOSIS — N920 Excessive and frequent menstruation with regular cycle: Secondary | ICD-10-CM | POA: Insufficient documentation

## 2020-06-20 NOTE — Progress Notes (Signed)
   Subjective:    Patient ID: Christina Blake, female    DOB: Feb 07, 1971, 49 y.o.   MRN: [id number]  HPI    Review of Systems     Objective:   Physical Exam        Assessment & Plan:

## 2020-06-22 ENCOUNTER — Encounter (INDEPENDENT_AMBULATORY_CARE_PROVIDER_SITE_OTHER): Payer: Self-pay | Admitting: *Deleted

## 2020-06-28 ENCOUNTER — Other Ambulatory Visit: Payer: Self-pay | Admitting: Nurse Practitioner

## 2020-06-28 DIAGNOSIS — F411 Generalized anxiety disorder: Secondary | ICD-10-CM | POA: Diagnosis not present

## 2020-06-28 DIAGNOSIS — F3181 Bipolar II disorder: Secondary | ICD-10-CM | POA: Diagnosis not present

## 2020-06-28 DIAGNOSIS — F4311 Post-traumatic stress disorder, acute: Secondary | ICD-10-CM | POA: Diagnosis not present

## 2020-06-28 DIAGNOSIS — Z122 Encounter for screening for malignant neoplasm of respiratory organs: Secondary | ICD-10-CM

## 2020-06-28 NOTE — Telephone Encounter (Signed)
Pt contacted and verbalized understanding. Pt is willing to have CT done if insurance approves it, if she has to pay she would like to decline CT.

## 2020-07-01 DIAGNOSIS — Z01419 Encounter for gynecological examination (general) (routine) without abnormal findings: Secondary | ICD-10-CM | POA: Diagnosis not present

## 2020-07-01 DIAGNOSIS — M545 Low back pain, unspecified: Secondary | ICD-10-CM | POA: Diagnosis not present

## 2020-07-01 DIAGNOSIS — Z13220 Encounter for screening for lipoid disorders: Secondary | ICD-10-CM | POA: Diagnosis not present

## 2020-07-01 DIAGNOSIS — Z79899 Other long term (current) drug therapy: Secondary | ICD-10-CM | POA: Diagnosis not present

## 2020-07-02 LAB — HEPATIC FUNCTION PANEL
ALT: 13 IU/L (ref 0–32)
AST: 13 IU/L (ref 0–40)
Albumin: 4.6 g/dL (ref 3.8–4.8)
Alkaline Phosphatase: 95 IU/L (ref 44–121)
Bilirubin Total: 0.2 mg/dL (ref 0.0–1.2)
Bilirubin, Direct: <0.1 mg/dL (ref 0.00–0.40)
Total Protein: 6.6 g/dL (ref 6.0–8.5)

## 2020-07-02 LAB — LIPID PANEL
Chol/HDL Ratio: 4 ratio (ref 0.0–4.4)
Cholesterol, Total: 190 mg/dL (ref 100–199)
HDL: 48 mg/dL (ref >39)
LDL Chol Calc (NIH): 131 mg/dL — ABNORMAL HIGH (ref 0–99)
Triglycerides: 56 mg/dL (ref 0–149)
VLDL Cholesterol Cal: 11 mg/dL (ref 5–40)

## 2020-07-02 LAB — CBC WITH DIFFERENTIAL/PLATELET
Basophils Absolute: 0.1 10*3/uL (ref 0.0–0.2)
Basos: 1 %
EOS (ABSOLUTE): 0.1 10*3/uL (ref 0.0–0.4)
Eos: 2 %
Hematocrit: 42.5 % (ref 34.0–46.6)
Hemoglobin: 14.2 g/dL (ref 11.1–15.9)
Immature Grans (Abs): 0 10*3/uL (ref 0.0–0.1)
Immature Granulocytes: 0 %
Lymphocytes Absolute: 2.2 10*3/uL (ref 0.7–3.1)
Lymphs: 32 %
MCH: 30.5 pg (ref 26.6–33.0)
MCHC: 33.4 g/dL (ref 31.5–35.7)
MCV: 91 fL (ref 79–97)
Monocytes Absolute: 0.7 10*3/uL (ref 0.1–0.9)
Monocytes: 10 %
Neutrophils Absolute: 3.7 10*3/uL (ref 1.4–7.0)
Neutrophils: 55 %
Platelets: 210 10*3/uL (ref 150–450)
RBC: 4.65 x10E6/uL (ref 3.77–5.28)
RDW: 11.9 % (ref 11.7–15.4)
WBC: 6.7 10*3/uL (ref 3.4–10.8)

## 2020-07-02 LAB — BASIC METABOLIC PANEL
BUN/Creatinine Ratio: 12 (ref 9–23)
BUN: 10 mg/dL (ref 6–24)
CO2: 20 mmol/L (ref 20–29)
Calcium: 9.2 mg/dL (ref 8.7–10.2)
Chloride: 102 mmol/L (ref 96–106)
Creatinine, Ser: 0.83 mg/dL (ref 0.57–1.00)
GFR calc Af Amer: 95 mL/min/{1.73_m2} (ref >59)
GFR calc non Af Amer: 82 mL/min/{1.73_m2} (ref >59)
Glucose: 96 mg/dL (ref 65–99)
Potassium: 4.2 mmol/L (ref 3.5–5.2)
Sodium: 137 mmol/L (ref 134–144)

## 2020-07-02 LAB — TSH: TSH: 1.82 u[IU]/mL (ref 0.450–4.500)

## 2020-07-02 LAB — VITAMIN D 25 HYDROXY (VIT D DEFICIENCY, FRACTURES): Vit D, 25-Hydroxy: 36.5 ng/mL (ref 30.0–100.0)

## 2020-07-07 ENCOUNTER — Telehealth: Payer: Self-pay | Admitting: *Deleted

## 2020-07-09 NOTE — Telephone Encounter (Signed)
Yes. I think we discussed during visit. This has no estrogen so a progesterone only pill (Micronor) or other methods we talked about will be safer. No estrogen is recommended.

## 2020-07-09 NOTE — Telephone Encounter (Signed)
Left message to return call 

## 2020-07-15 NOTE — Telephone Encounter (Addendum)
Patient notified per Hoyle Sauer NP: Discussed during visit. This has no estrogen so a progesterone only pill (Micronor) or other methods we talked about will be safer. No estrogen is recommended. Patient verbalized understanding and stated she has stopped the medication

## 2020-07-23 ENCOUNTER — Ambulatory Visit: Payer: 59 | Admitting: Urology

## 2020-07-29 ENCOUNTER — Encounter (INDEPENDENT_AMBULATORY_CARE_PROVIDER_SITE_OTHER): Payer: Self-pay

## 2020-07-29 ENCOUNTER — Encounter (INDEPENDENT_AMBULATORY_CARE_PROVIDER_SITE_OTHER): Payer: Self-pay | Admitting: Internal Medicine

## 2020-07-29 ENCOUNTER — Telehealth (INDEPENDENT_AMBULATORY_CARE_PROVIDER_SITE_OTHER): Payer: Self-pay

## 2020-07-29 ENCOUNTER — Other Ambulatory Visit: Payer: Self-pay

## 2020-07-29 ENCOUNTER — Ambulatory Visit (INDEPENDENT_AMBULATORY_CARE_PROVIDER_SITE_OTHER): Payer: BLUE CROSS/BLUE SHIELD | Admitting: Internal Medicine

## 2020-07-29 ENCOUNTER — Other Ambulatory Visit (INDEPENDENT_AMBULATORY_CARE_PROVIDER_SITE_OTHER): Payer: Self-pay

## 2020-07-29 DIAGNOSIS — N3000 Acute cystitis without hematuria: Secondary | ICD-10-CM | POA: Diagnosis not present

## 2020-07-29 DIAGNOSIS — Z8719 Personal history of other diseases of the digestive system: Secondary | ICD-10-CM

## 2020-07-29 DIAGNOSIS — Z1211 Encounter for screening for malignant neoplasm of colon: Secondary | ICD-10-CM

## 2020-07-29 DIAGNOSIS — N390 Urinary tract infection, site not specified: Secondary | ICD-10-CM | POA: Insufficient documentation

## 2020-07-29 MED ORDER — SULFAMETHOXAZOLE-TRIMETHOPRIM 800-160 MG PO TABS: 1.0000 | ORAL_TABLET | Freq: Two times a day (BID) | ORAL | 0 refills | Status: DC

## 2020-07-29 MED ORDER — DICYCLOMINE HCL 10 MG PO CAPS: 10.0000 mg | ORAL_CAPSULE | Freq: Two times a day (BID) | ORAL | 5 refills | Status: DC

## 2020-07-29 MED ORDER — NA SULFATE-K SULFATE-MG SULF 17.5-3.13-1.6 GM/177ML PO SOLN: 354.0000 mL | Freq: Once | ORAL | 0 refills | Status: AC

## 2020-07-29 MED ORDER — METAMUCIL SMOOTH TEXTURE 58.6 % PO POWD: 1.0000 | Freq: Every day | ORAL | Status: DC

## 2020-07-29 NOTE — Patient Instructions (Signed)
Take dicyclomine 10 mg 30 minutes before lunch and evening meal Metamucil 1 packet or 4 g daily at bedtime. Keep stool diary as to frequency and consistency of stools and when you have accidents and/or fecal incontinence. Kegel exercise twice daily as directed. If urinary symptoms do not resolve with Bactrim please contact PCPs office Colonoscopy to be scheduled in near future

## 2020-07-29 NOTE — Telephone Encounter (Signed)
[name], CMA  

## 2020-07-29 NOTE — Progress Notes (Addendum)
Presenting complaint;  Diarrhea  History of present illness  Patient is 50 year old Caucasian female in [place] who presents with 1 year history of diarrhea.  She states before her diarrhea started her baseline was 1 bowel movement per day.  She generally has 3-4 stools per day.  For stool is always normal are formed and subsequent stool consistency gets worse.  She rarely has more than 4 stools per day.  She has urgency and 1-2 accidents per month.  She also has noted incontinence at night while she is asleep.  She has had one episode per month.  Her appetite is good and her weight has been stable.  She states she drinks 6 to 8 cans of [facility] every day.  He generally has had 2 cans even before she leaves for work.  She does not eat breakfast.  She generally eats 2 meals which is lunch and supper.  She does not use dairy products.  She has not been diagnosed with lactose intolerance. She denies melena or rectal bleeding. Family history is negative for celiac disease IBD or CRC. She also complains of increased urinary frequency and burning.  She has responded to Bactrim DS in the past.  She is not having fever chills or flank pain.    Current Medications: Outpatient Encounter Medications as of 07/29/2020  Medication Sig  . acyclovir (ZOVIRAX) 800 MG tablet Take two po bid. Use as directed  . ALPRAZolam (XANAX) 0.5 MG tablet Take 0.5 mg by mouth 3 (three) times daily as needed for anxiety.  . Calcium Citrate-Vitamin D (CALCIUM + D PO) Take by mouth.  . dimenhyDRINATE (DRAMAMINE) 50 MG tablet Take 25 mg by mouth as needed.  . lamoTRIgine (LAMICTAL) 200 MG tablet Take 200 mg by mouth at bedtime.  Marland Kitchen LATUDA 60 MG TABS Take 1 tablet by mouth daily.  . methocarbamol (ROBAXIN) 500 MG tablet Take 1-2 tab, PO 4 times daily PRN muscle spasms  . Multiple Vitamin (MULTIVITAMIN) capsule Take 1 capsule by mouth daily.  . norethindrone (MICRONOR) 0.35 MG tablet Take 1 tablet (0.35 mg total) by mouth daily.   . Nutritional Supplements (ESTROVEN PO) Take by mouth.  Marland Kitchen PARoxetine (PAXIL-CR) 37.5 MG 24 hr tablet Take 75 mg by mouth daily.  . temazepam (RESTORIL) 30 MG capsule Take 1 capsule by mouth at bedtime.  . traMADol (ULTRAM) 50 MG tablet Take 1 tablet (50 mg total) by mouth every 6 (six) hours as needed. For pain  . traZODone (DESYREL) 100 MG tablet Take 100 mg by mouth at bedtime.  . promethazine (PHENERGAN) 25 MG tablet Take 1 tablet (25 mg total) by mouth every 8 (eight) hours as needed for nausea or vomiting. (Patient not taking: Reported on 07/29/2020)   Facility-Administered Encounter Medications as of 07/29/2020  Medication  . methylPREDNISolone acetate (DEPO-MEDROL) injection 40 mg   Past medical history Hyperlipidemia Anxiety Depression.  She has been diagnosed with bipolar disorder type I. History of pulmonary embolism in 2012(Megace and smoking). History of aphthous oral ulcers. Chronic neck and low back pain.  Uterine ablation for menometrorrhagia in March 2013 Lumbar fusion at L4-5 and L5-S1 in January 2014 Anterior cervical decompression/discectomy at C4-5 and C6-7 with fusion and allograft bone graft.   Allergies  No Known Allergies   Family history  Both parents are disease.  Father lived to be 85 and mother died at 13.  She has a sister age 35 in good health.  Social history  She is separated.  She  has 2 daughters ages 66 and 34.  Her older daughter has been diabetic for 3 years.  She had a nursing degree 1993.  She works at [employer] services. She smokes 1-1/2 to 2 packs/day.  She has been smoking for 30 years.  She does not drink alcohol.  Physical examination  Blood pressure 113/81, pulse 88, temperature 98.5 F (36.9 C), temperature source Oral, height '5\' 7"'  (1.702 m), weight 179 lb (81.2 kg). Patient is alert and in no acute distress. She is wearing a mask. Conjunctiva is pink. Sclera is nonicteric Oropharyngeal mucosa is  normal. No neck masses or thyromegaly noted. Cardiac exam with regular rhythm normal S1 and S2. No murmur or gallop noted. Lungs are clear to auscultation. Abdomen is symmetrical.  Bowel sounds are normal.  On palpation abdomen is soft and nontender with organomegaly or masses. No LE edema or clubbing noted.  Labs/studies Results:  CBC Latest Ref Rng & Units 07/01/2020 03/01/2019 02/27/2015  WBC 3.4 - 10.8 x10E3/uL 6.7 7.1 5.4  Hemoglobin 11.1 - 15.9 g/dL 14.2 15.2(H) 15.0  Hematocrit 34.0 - 46.6 % 42.5 46.7(H) 43.9  Platelets 150 - 450 x10E3/uL 210 203 228    CMP Latest Ref Rng & Units 07/01/2020 03/01/2019 11/13/2017  Glucose 65 - 99 mg/dL 96 108(H) 88  BUN 6 - 24 mg/dL '10 10 17  ' Creatinine 0.57 - 1.00 mg/dL 0.83 0.95 0.90  Sodium 134 - 144 mmol/L 137 137 141  Potassium 3.5 - 5.2 mmol/L 4.2 4.2 4.8  Chloride 96 - 106 mmol/L 102 105 108(H)  CO2 20 - 29 mmol/L '20 26 20  ' Calcium 8.7 - 10.2 mg/dL 9.2 9.2 9.2  Total Protein 6.0 - 8.5 g/dL 6.6 7.2 6.4  Total Bilirubin 0.0 - 1.2 mg/dL 0.2 0.2(L) 0.2  Alkaline Phos 44 - 121 IU/L 95 82 105  AST 0 - 40 IU/L '13 16 13  ' ALT 0 - 32 IU/L '13 13 23    ' Hepatic Function Latest Ref Rng & Units 07/01/2020 03/01/2019 11/13/2017  Total Protein 6.0 - 8.5 g/dL 6.6 7.2 6.4  Albumin 3.8 - 4.8 g/dL 4.6 4.1 4.2  AST 0 - 40 IU/L '13 16 13  ' ALT 0 - 32 IU/L '13 13 23  ' Alk Phosphatase 44 - 121 IU/L 95 82 105  Total Bilirubin 0.0 - 1.2 mg/dL 0.2 0.2(L) 0.2  Bilirubin, Direct 0.00 - 0.40 mg/dL <0.10 - 0.07    Lab Results  Component Value Date   CRP <0.5 (L) 09/18/2013     Assessment:  #1.  Chronic diarrhea with urgency and accidents as well as fecal seepage.  She has good sphincter tone.  She was instructed to do Kegel exercises to strengthen pelvic floor muscles and anal sphincter. She possibly has IBS but need to rule out endoscopic or microscopic colitis.  #2.  Urinary tract infection.  She has symptoms of cystitis.  Recommendations  Dicyclomine 10 mg  by mouth 30 minutes before lunch and evening meal daily. Metamucil 1 packet or 4 g by mouth daily at bedtime. Bactrim DS 1 tablet p.o. twice daily as needed.  If symptoms do not improve in the next 2 to 3 days she should contact primary care physician's office. Diagnostic colonoscopy to be scheduled in near future. Patient also advised to decrease intake of [facility] to no more than 2 cans/day. Patient advised to keep stool diary as to frequency and consistency of stools and the times when she has an accident or incontinence until  office visit in 3 months.

## 2020-07-30 ENCOUNTER — Ambulatory Visit: Payer: BLUE CROSS/BLUE SHIELD | Admitting: Family Medicine

## 2020-08-09 ENCOUNTER — Encounter (HOSPITAL_COMMUNITY): Admission: RE | Admit: 2020-08-09 | Payer: BLUE CROSS/BLUE SHIELD | Source: Ambulatory Visit

## 2020-08-09 ENCOUNTER — Other Ambulatory Visit (HOSPITAL_COMMUNITY): Payer: BLUE CROSS/BLUE SHIELD

## 2020-08-11 ENCOUNTER — Encounter (HOSPITAL_COMMUNITY): Admission: RE | Payer: Self-pay | Source: Home / Self Care

## 2020-08-11 ENCOUNTER — Ambulatory Visit (HOSPITAL_COMMUNITY)
Admission: RE | Admit: 2020-08-11 | Payer: BLUE CROSS/BLUE SHIELD | Source: Home / Self Care | Admitting: Internal Medicine

## 2020-08-11 SURGERY — COLONOSCOPY WITH PROPOFOL
Anesthesia: Monitor Anesthesia Care

## 2020-09-06 ENCOUNTER — Ambulatory Visit: Payer: 59 | Admitting: Urology

## 2020-09-07 ENCOUNTER — Encounter (HOSPITAL_COMMUNITY): Payer: Self-pay

## 2020-09-07 NOTE — Progress Notes (Signed)
Received referral for initial lung cancer screening scan. Contacted patient to discuss LCS and smoking history. Patient request that I call her back at a later date.

## 2020-09-08 ENCOUNTER — Other Ambulatory Visit (HOSPITAL_COMMUNITY): Payer: Self-pay

## 2020-09-08 ENCOUNTER — Encounter (HOSPITAL_COMMUNITY): Payer: Self-pay

## 2020-09-08 DIAGNOSIS — Z122 Encounter for screening for malignant neoplasm of respiratory organs: Secondary | ICD-10-CM

## 2020-09-08 DIAGNOSIS — Z87891 Personal history of nicotine dependence: Secondary | ICD-10-CM

## 2020-09-08 NOTE — Progress Notes (Signed)
Received referral for initial lung cancer screening scan. Contacted patient and obtained smoking history (started age 50 smoking 1PPD until the age of 40 when she increased to 2PPD, current smoker continuing to smoking 2PPD, 44 pack year) as well as answering questions related to the screening process. Patient denies signs/symptoms of lung cancer such as weight loss or hemoptysis. Patient denies comorbidity that would prevent curative treatment if lung cancer were to be found.  Patient is scheduled for shared decision making visit on 09/09/2020.

## 2020-09-09 ENCOUNTER — Other Ambulatory Visit: Payer: Self-pay

## 2020-09-09 ENCOUNTER — Inpatient Hospital Stay (HOSPITAL_COMMUNITY): Payer: BLUE CROSS/BLUE SHIELD | Admitting: Oncology

## 2020-09-09 NOTE — Progress Notes (Signed)
Unable to reach.

## 2020-09-10 ENCOUNTER — Encounter (HOSPITAL_COMMUNITY): Payer: Self-pay

## 2020-10-06 ENCOUNTER — Ambulatory Visit (HOSPITAL_COMMUNITY): Payer: BLUE CROSS/BLUE SHIELD

## 2020-10-11 ENCOUNTER — Encounter (HOSPITAL_COMMUNITY): Payer: Self-pay

## 2020-10-11 NOTE — Progress Notes (Signed)
I have attempted to reach the patient regarding initial LDCT scheduling. Unable to reach patient or leave a VM at this time.

## 2020-10-15 ENCOUNTER — Other Ambulatory Visit: Payer: Self-pay | Admitting: Nurse Practitioner

## 2020-10-26 DIAGNOSIS — F4311 Post-traumatic stress disorder, acute: Secondary | ICD-10-CM | POA: Diagnosis not present

## 2020-10-26 DIAGNOSIS — F3181 Bipolar II disorder: Secondary | ICD-10-CM | POA: Diagnosis not present

## 2020-10-26 DIAGNOSIS — F411 Generalized anxiety disorder: Secondary | ICD-10-CM | POA: Diagnosis not present

## 2020-11-15 ENCOUNTER — Other Ambulatory Visit (INDEPENDENT_AMBULATORY_CARE_PROVIDER_SITE_OTHER): Payer: Self-pay

## 2020-11-16 ENCOUNTER — Ambulatory Visit (INDEPENDENT_AMBULATORY_CARE_PROVIDER_SITE_OTHER): Payer: BLUE CROSS/BLUE SHIELD | Admitting: Internal Medicine

## 2020-11-30 ENCOUNTER — Encounter (HOSPITAL_COMMUNITY): Payer: Self-pay

## 2020-11-30 NOTE — Progress Notes (Signed)
Patient rescheduled for LDCT on 06/21 at 0800. Patient aware.

## 2020-12-08 ENCOUNTER — Other Ambulatory Visit (INDEPENDENT_AMBULATORY_CARE_PROVIDER_SITE_OTHER): Payer: Self-pay

## 2020-12-08 NOTE — Patient Instructions (Signed)
Your procedure is scheduled on: 12/15/2020  Report to [facility] entrance at  6:15   AM.  Call this number if you have problems the morning of surgery: [phone]   Remember:              Follow Directions on the letter you received from Your Physician's office regarding the Bowel Prep              No Smoking the day of Procedure :   Take these medicines the morning of surgery with A SIP OF WATER: Xanax, tramadol, and/or robaxin if needed   Do not wear jewelry, make-up or nail polish.    Do not bring valuables to the hospital.  Contacts, dentures or bridgework may not be worn into surgery.  .   Patients discharged the day of surgery will not be allowed to drive home.     Colonoscopy, Adult, Care After This sheet gives you information about how to care for yourself after your procedure. Your health care provider may also give you more specific instructions. If you have problems or questions, contact your health care provider. What can I expect after the procedure? After the procedure, it is common to have:  A small amount of blood in your stool for 24 hours after the procedure.  Some gas.  Mild abdominal cramping or bloating.  Follow these instructions at home: General instructions   For the first 24 hours after the procedure: ? Do not drive or use machinery. ? Do not sign important documents. ? Do not drink alcohol. ? Do your regular daily activities at a slower pace than normal. ? Eat soft, easy-to-digest foods. ? Rest often.  Take over-the-counter or prescription medicines only as told by your health care provider.  It is up to you to get the results of your procedure. Ask your health care provider, or the department performing the procedure, when your results will be ready. Relieving cramping and bloating  Try walking around when you have cramps or feel bloated.  Apply heat to your abdomen as told by your health care provider. Use a heat source that your  health care provider recommends, such as a moist heat pack or a heating pad. ? Place a towel between your skin and the heat source. ? Leave the heat on for 20-30 minutes. ? Remove the heat if your skin turns bright red. This is especially important if you are unable to feel pain, heat, or cold. You may have a greater risk of getting burned. Eating and drinking  Drink enough fluid to keep your urine clear or pale yellow.  Resume your normal diet as instructed by your health care provider. Avoid heavy or fried foods that are hard to digest.  Avoid drinking alcohol for as long as instructed by your health care provider. Contact a health care provider if:  You have blood in your stool 2-3 days after the procedure. Get help right away if:  You have more than a small spotting of blood in your stool.  You pass large blood clots in your stool.  Your abdomen is swollen.  You have nausea or vomiting.  You have a fever.  You have increasing abdominal pain that is not relieved with medicine. This information is not intended to replace advice given to you by your health care provider. Make sure you discuss any questions you have with your health care provider. Document Released: 02/01/2004 Document Revised: 03/13/2016 Document Reviewed: 08/31/2015 Elsevier Interactive  Patient Education  [employer].

## 2020-12-13 ENCOUNTER — Encounter (HOSPITAL_COMMUNITY)
Admission: RE | Admit: 2020-12-13 | Discharge: 2020-12-13 | Disposition: A | Discharge status: Home / Self Care | Payer: BLUE CROSS/BLUE SHIELD | Source: Ambulatory Visit | Attending: Internal Medicine | Admitting: Internal Medicine

## 2020-12-13 ENCOUNTER — Other Ambulatory Visit (HOSPITAL_COMMUNITY): Admission: RE | Admit: 2020-12-13 | Payer: BLUE CROSS/BLUE SHIELD | Source: Ambulatory Visit

## 2020-12-13 ENCOUNTER — Other Ambulatory Visit: Payer: Self-pay

## 2020-12-13 ENCOUNTER — Encounter (HOSPITAL_COMMUNITY): Payer: Self-pay

## 2020-12-13 DIAGNOSIS — F17210 Nicotine dependence, cigarettes, uncomplicated: Secondary | ICD-10-CM | POA: Diagnosis not present

## 2020-12-13 DIAGNOSIS — Z01812 Encounter for preprocedural laboratory examination: Secondary | ICD-10-CM | POA: Insufficient documentation

## 2020-12-13 DIAGNOSIS — Z79899 Other long term (current) drug therapy: Secondary | ICD-10-CM | POA: Diagnosis not present

## 2020-12-13 DIAGNOSIS — D120 Benign neoplasm of cecum: Secondary | ICD-10-CM | POA: Diagnosis not present

## 2020-12-13 DIAGNOSIS — Z1211 Encounter for screening for malignant neoplasm of colon: Secondary | ICD-10-CM | POA: Diagnosis not present

## 2020-12-13 DIAGNOSIS — Z86711 Personal history of pulmonary embolism: Secondary | ICD-10-CM | POA: Diagnosis not present

## 2020-12-13 LAB — PREGNANCY, URINE: Preg Test, Ur: NEGATIVE

## 2020-12-15 ENCOUNTER — Ambulatory Visit (HOSPITAL_COMMUNITY): Payer: BLUE CROSS/BLUE SHIELD | Admitting: Certified Registered"

## 2020-12-15 ENCOUNTER — Encounter (HOSPITAL_COMMUNITY)
Admission: RE | Disposition: A | Discharge status: Home / Self Care | Payer: Self-pay | Source: Home / Self Care | Attending: Internal Medicine

## 2020-12-15 ENCOUNTER — Encounter (HOSPITAL_COMMUNITY): Payer: Self-pay | Admitting: Internal Medicine

## 2020-12-15 ENCOUNTER — Ambulatory Visit (HOSPITAL_COMMUNITY)
Admission: RE | Admit: 2020-12-15 | Discharge: 2020-12-15 | Disposition: A | Discharge status: Home / Self Care | Payer: BLUE CROSS/BLUE SHIELD | Attending: Internal Medicine | Admitting: Internal Medicine

## 2020-12-15 DIAGNOSIS — F17210 Nicotine dependence, cigarettes, uncomplicated: Secondary | ICD-10-CM | POA: Diagnosis not present

## 2020-12-15 DIAGNOSIS — D120 Benign neoplasm of cecum: Secondary | ICD-10-CM | POA: Insufficient documentation

## 2020-12-15 DIAGNOSIS — Z79899 Other long term (current) drug therapy: Secondary | ICD-10-CM | POA: Insufficient documentation

## 2020-12-15 DIAGNOSIS — Z1211 Encounter for screening for malignant neoplasm of colon: Secondary | ICD-10-CM | POA: Diagnosis not present

## 2020-12-15 DIAGNOSIS — K635 Polyp of colon: Secondary | ICD-10-CM | POA: Diagnosis not present

## 2020-12-15 DIAGNOSIS — Z86711 Personal history of pulmonary embolism: Secondary | ICD-10-CM | POA: Insufficient documentation

## 2020-12-15 DIAGNOSIS — F418 Other specified anxiety disorders: Secondary | ICD-10-CM | POA: Diagnosis not present

## 2020-12-15 HISTORY — PX: HEMOSTASIS CLIP PLACEMENT: SHX6857

## 2020-12-15 HISTORY — PX: COLONOSCOPY WITH PROPOFOL: SHX5780

## 2020-12-15 HISTORY — PX: HOT HEMOSTASIS: SHX5433

## 2020-12-15 HISTORY — PX: POLYPECTOMY: SHX5525

## 2020-12-15 LAB — HM COLONOSCOPY

## 2020-12-15 SURGERY — COLONOSCOPY WITH PROPOFOL
Anesthesia: General

## 2020-12-15 MED ORDER — PROPOFOL 10 MG/ML IV BOLUS
INTRAVENOUS | Status: DC | PRN
  Administered 2020-12-15: 40 mg via INTRAVENOUS
  Administered 2020-12-15: 100 mg via INTRAVENOUS

## 2020-12-15 MED ORDER — PHENYLEPHRINE 40 MCG/ML (10ML) SYRINGE FOR IV PUSH (FOR BLOOD PRESSURE SUPPORT)
PREFILLED_SYRINGE | INTRAVENOUS | Status: DC | PRN
  Administered 2020-12-15 (×4): 80 ug via INTRAVENOUS

## 2020-12-15 MED ORDER — PHENYLEPHRINE 40 MCG/ML (10ML) SYRINGE FOR IV PUSH (FOR BLOOD PRESSURE SUPPORT)
PREFILLED_SYRINGE | INTRAVENOUS | Status: AC
  Filled 2020-12-15: qty 10

## 2020-12-15 MED ORDER — LACTATED RINGERS IV SOLN: INTRAVENOUS | Status: DC

## 2020-12-15 MED ORDER — LIDOCAINE HCL (CARDIAC) PF 100 MG/5ML IV SOSY
PREFILLED_SYRINGE | INTRAVENOUS | Status: DC | PRN
  Administered 2020-12-15: 50 mg via INTRAVENOUS

## 2020-12-15 MED ORDER — PROPOFOL 500 MG/50ML IV EMUL
INTRAVENOUS | Status: DC | PRN
  Administered 2020-12-15: 150 ug/kg/min via INTRAVENOUS

## 2020-12-15 NOTE — Anesthesia Postprocedure Evaluation (Signed)
Anesthesia Post Note  Patient: Christina Blake  Procedure(s) Performed: COLONOSCOPY WITH PROPOFOL POLYPECTOMY HOT HEMOSTASIS (ARGON PLASMA COAGULATION/BICAP) HEMOSTASIS CLIP PLACEMENT  Patient location during evaluation: Phase II Anesthesia Type: General Level of consciousness: awake Pain management: pain level controlled Vital Signs Assessment: post-procedure vital signs reviewed and stable Respiratory status: spontaneous breathing and respiratory function stable Cardiovascular status: blood pressure returned to baseline and stable Postop Assessment: no headache and no apparent nausea or vomiting Anesthetic complications: no Comments: Late entry   No notable events documented.   Last Vitals:  Vitals:   12/15/20 0759 12/15/20 0813  BP: (!) 88/65 96/66  Pulse: 72 65  Resp: 16 16  Temp: 36.6 C   SpO2: 95% 100%    Last Pain:  Vitals:   12/15/20 0759  TempSrc: Oral  PainSc: 0-No pain                 Louann Sjogren

## 2020-12-15 NOTE — Discharge Instructions (Addendum)
No aspirin or NSAIDs for 1 week. Resume usual medications and diet as before. No driving for 24 hours. Remember you cannot have an MRI until clips has passed. Physician will call with biopsy results and further recommendations.

## 2020-12-15 NOTE — Anesthesia Preprocedure Evaluation (Signed)
Anesthesia Evaluation  Patient identified by MRN, date of birth, ID band Patient awake    Reviewed: Allergy & Precautions, H&P , NPO status , Patient's Chart, lab work & pertinent test results, reviewed documented beta blocker date and time   Airway Mallampati: II  TM Distance: >3 FB Neck ROM: full    Dental no notable dental hx.    Pulmonary neg pulmonary ROS, Current Smoker,    Pulmonary exam normal breath sounds clear to auscultation       Cardiovascular Exercise Tolerance: Good negative cardio ROS   Rhythm:regular Rate:Normal     Neuro/Psych PSYCHIATRIC DISORDERS Anxiety Depression negative neurological ROS     GI/Hepatic negative GI ROS, Neg liver ROS,   Endo/Other  negative endocrine ROS  Renal/GU negative Renal ROS  negative genitourinary   Musculoskeletal   Abdominal   Peds  Hematology negative hematology ROS (+)   Anesthesia Other Findings   Reproductive/Obstetrics negative OB ROS                             Anesthesia Physical Anesthesia Plan  ASA: 2  Anesthesia Plan: General   Post-op Pain Management:    Induction:   PONV Risk Score and Plan: Propofol infusion  Airway Management Planned:   Additional Equipment:   Intra-op Plan:   Post-operative Plan:   Informed Consent: I have reviewed the patients History and Physical, chart, labs and discussed the procedure including the risks, benefits and alternatives for the proposed anesthesia with the patient or authorized representative who has indicated his/her understanding and acceptance.     Dental Advisory Given  Plan Discussed with: CRNA  Anesthesia Plan Comments:         Anesthesia Quick Evaluation

## 2020-12-15 NOTE — Transfer of Care (Signed)
Immediate Anesthesia Transfer of Care Note  Patient: Christina Blake  Procedure(s) Performed: COLONOSCOPY WITH PROPOFOL POLYPECTOMY HOT HEMOSTASIS (ARGON PLASMA COAGULATION/BICAP) HEMOSTASIS CLIP PLACEMENT  Patient Location: PACU  Anesthesia Type:General  Level of Consciousness: awake  Airway & Oxygen Therapy: Patient Spontanous Breathing and Patient connected to nasal cannula oxygen  Post-op Assessment: Report given to RN and Post -op Vital signs reviewed and stable  Post vital signs: Reviewed and stable  Last Vitals:  Vitals Value Taken Time  BP    Temp    Pulse    Resp    SpO2      Last Pain:  Vitals:   12/15/20 0731  TempSrc:   PainSc: 0-No pain      Patients Stated Pain Goal: 5 (93/81/01 7510)  Complications: No notable events documented.

## 2020-12-15 NOTE — Anesthesia Procedure Notes (Signed)
Date/Time: 12/15/2020 7:34 AM Performed by: Orlie Dakin, CRNA Pre-anesthesia Checklist: Patient identified, Emergency Drugs available, Suction available and Patient being monitored Patient Re-evaluated:Patient Re-evaluated prior to induction Oxygen Delivery Method: Nasal cannula Induction Type: IV induction Placement Confirmation: positive ETCO2

## 2020-12-15 NOTE — Op Note (Signed)
[facility] Patient Name: Christina Blake Procedure Date: 12/15/2020 7:15 AM MRN: [id number] Date of Birth: 14-Apr-1971 Attending MD: Hildred Laser , MD CSN: [id number] Age: 50 Admit Type: Outpatient Procedure:                Colonoscopy Indications:              Screening for colorectal malignant neoplasm Providers:                Hildred Laser, MD, Gwenlyn Fudge, RN, Randa Spike, Technician Referring MD:             Elvia Collum, DO Medicines:                Propofol per Anesthesia Complications:            No immediate complications. Estimated Blood Loss:     Estimated blood loss was minimal. Procedure:                Pre-Anesthesia Assessment:                           - Prior to the procedure, a History and Physical                            was performed, and patient medications and                            allergies were reviewed. The patient's tolerance of                            previous anesthesia was also reviewed. The risks                            and benefits of the procedure and the sedation                            options and risks were discussed with the patient.                            All questions were answered, and informed consent                            was obtained. Prior Anticoagulants: The patient has                            taken no previous anticoagulant or antiplatelet                            agents. ASA Grade Assessment: II - A patient with                            mild systemic disease. After reviewing the risks  and benefits, the patient was deemed in                            satisfactory condition to undergo the procedure.                           After obtaining informed consent, the colonoscope                            was passed under direct vision. Throughout the                            procedure, the patient's blood pressure, pulse, and                             oxygen saturations were monitored continuously. The                            [id number]) was introduced through the anus                            and advanced to the the cecum, identified by                            appendiceal orifice and ileocecal valve. The                            colonoscopy was performed without difficulty. The                            patient tolerated the procedure well. The quality                            of the bowel preparation was good. The ileocecal                            valve, appendiceal orifice, and rectum were                            photographed. Scope In: 7:33:08 AM Scope Out: 7:50:26 AM Scope Withdrawal Time: 0 hours 13 minutes 44 seconds  Total Procedure Duration: 0 hours 17 minutes 18 seconds  Findings:      The perianal and digital rectal examinations were normal.      A 10 mm polyp was found in the ileocecal valve. The polyp was flat.       Polyp was cold snared. Residual polyp at the margin was ablated with       APC. Oozing noted from polypectomy site. For hemostasis, one hemostatic       clip was successfully placed (MR conditional). There was no bleeding at       the end of the procedure.      The exam was otherwise normal throughout the examined colon.      The retroflexed view of the distal rectum and anal verge was normal and       showed  no anal or rectal abnormalities. Impression:               - One 10 mm polyp at the ileocecal valve. Polyp was                            cold snared. Residual fragment and margin ablated                            with APC. Clip (MR conditional) was placed for                            hemostasis.                           - No specimens collected. Moderate Sedation:      Per Anesthesia Care Recommendation:           - Patient has a contact number available for                            emergencies. The signs and symptoms of potential                             delayed complications were discussed with the                            patient. Return to normal activities tomorrow.                            Written discharge instructions were provided to the                            patient.                           - Resume previous diet today.                           - Continue present medications.                           - No aspirin, ibuprofen, naproxen, or other                            non-steroidal anti-inflammatory drugs for 7 days                            after polyp removal.                           - Await pathology results.                           - No MRI until clip has passed.                           - Repeat colonoscopy is  recommended. The                            colonoscopy date will be determined after pathology                            results from today's exam become available for                            review. Procedure Code(s):        --- Professional ---                           [phone], Colonoscopy, flexible; with control of                            bleeding, any method Diagnosis Code(s):        --- Professional ---                           Z12.11, Encounter for screening for malignant                            neoplasm of colon                           K63.5, Polyp of colon CPT copyright 2019 American [employer] Association. All rights reserved. The codes documented in this report are preliminary and upon coder review may  be revised to meet current compliance requirements. Hildred Laser, MD Hildred Laser, MD 12/15/2020 8:03:50 AM This report has been signed electronically. Number of Addenda: 0

## 2020-12-15 NOTE — H&P (Signed)
Christina Blake is an 50 y.o. female.   Chief Complaint: Patient is here for colonoscopy. HPI: Patient is 50 year old Caucasian female who was seen in the office in January 2022 for chronic diarrhea.  I felt she had IBS.  Patient states she stopped taking her Robaxin and her diarrhea has resolved.  She is not having normal stools.  She has had normal stools for more than 3 months.  She did not even have to take dicyclomine.  She denies abdominal pain or rectal bleeding. Therefore he does not need a diagnostic colonoscopy.  Indication would be screening colonoscopy. Family history is negative for CRC.   Past Medical History:  Diagnosis Date   Anxiety    Cervical neuralgia    Chronic back pain    Depression    History of agoraphobia 2007   Hyperlipidemia    Orthostatic hypotension    Pulmonary embolism, bilateral ([place]) 2012   Trochanteric bursitis of both hips     Past Surgical History:  Procedure Laterality Date   ANTERIOR CERVICAL DECOMP/DISCECTOMY FUSION N/A 09/04/2014   Procedure: Cervical four-five Cervical six-seven Anterior cervical decompression/diskectomy,fusion with exploration of Cervical five-six Fusion (Removal of Helix plate);  Surgeon: Erline Levine, MD;  Location: [facility];  Service: Neurosurgery;  Laterality: N/A;   BACK SURGERY     C5 & C6 fusion  07/25/12   L4,L5 & S1 fusion  07/23/12   OSTEOTOMY     Left foot 2nd toe-[facility]   Uterine ablation      Family History  Problem Relation Age of Onset   Hypertension Mother    Heart disease Mother 59       MI at 83    Osteoporosis Mother    Breast cancer Maternal Aunt    Breast cancer Cousin        Maternal first cousin   Anesthesia problems Neg Hx    Hypotension Neg Hx    Malignant hyperthermia Neg Hx    Pseudochol deficiency Neg Hx    Social History:  reports that she has been smoking cigarettes. She has a 40.00 pack-year smoking history. She has never used smokeless tobacco. She reports that she does not drink  alcohol and does not use drugs.  Allergies: No Known Allergies  Facility-Administered Medications Prior to Admission  Medication Dose Route Frequency Provider Last Rate Last Admin   methylPREDNISolone acetate (DEPO-MEDROL) injection 40 mg  40 mg Intra-articular Once Mikey Kirschner, MD       Medications Prior to Admission  Medication Sig Dispense Refill   acyclovir (ZOVIRAX) 800 MG tablet Take two po bid. Use as directed (Patient taking differently: Take 800 mg by mouth 2 (two) times daily as needed (fever blisters/cold sores.).) 24 tablet 0   ALPRAZolam (XANAX) 0.5 MG tablet Take 0.5 mg by mouth 4 (four) times daily as needed for anxiety.     Calcium Citrate-Vitamin D (CALCIUM + D PO) Take 1 tablet by mouth at bedtime.     Cholecalciferol (VITAMIN D-3) 125 MCG (5000 UT) TABS Take 5,000 Units by mouth at bedtime.     lamoTRIgine (LAMICTAL) 200 MG tablet Take 200 mg by mouth at bedtime.     methocarbamol (ROBAXIN) 500 MG tablet TAKE 1 OR 2 TABLETS FOUR TIMES DAILY AS NEEDED FOR MUSCLE SPASM. (Patient taking differently: Take 500-1,000 mg by mouth every 6 (six) hours as needed for muscle spasms. TAKE 1 OR 2 TABLETS FOUR TIMES DAILY AS NEEDED FOR MUSCLE SPASM.) 40 tablet 0  norethindrone (MICRONOR) 0.35 MG tablet Take 1 tablet (0.35 mg total) by mouth daily. (Patient taking differently: Take 1 tablet by mouth at bedtime.) 28 tablet 11   PARoxetine (PAXIL-CR) 37.5 MG 24 hr tablet Take 75 mg by mouth at bedtime.     Rhubarb (ESTROVEN MENOPAUSE RELIEF) 4 MG TABS Take 4 mg by mouth daily.     temazepam (RESTORIL) 30 MG capsule Take 30 mg by mouth at bedtime.     traMADol (ULTRAM) 50 MG tablet TAKE (1) TABLET BY MOUTH EVERY 6 HOURS AS NEEDED FOR PAIN. (Patient taking differently: Take 50 mg by mouth every 6 (six) hours as needed for severe pain.) 30 tablet 0   traZODone (DESYREL) 150 MG tablet Take 300 mg by mouth at bedtime.     dicyclomine (BENTYL) 10 MG capsule Take 1 capsule (10 mg total) by  mouth 2 (two) times daily before a meal. (Patient not taking: Reported on 12/06/2020) 60 capsule 5   promethazine (PHENERGAN) 25 MG tablet Take 1 tablet (25 mg total) by mouth every 8 (eight) hours as needed for nausea or vomiting. (Patient not taking: No sig reported) 15 tablet 0   psyllium (METAMUCIL SMOOTH TEXTURE) 58.6 % powder Take 1 packet by mouth at bedtime. (Patient not taking: Reported on 12/06/2020)     sulfamethoxazole-trimethoprim (BACTRIM DS) 800-160 MG tablet Take 1 tablet by mouth 2 (two) times daily. (Patient not taking: Reported on 12/06/2020) 14 tablet 0    Results for orders placed or performed during the hospital encounter of 12/13/20 (from the past 48 hour(s))  Pregnancy, urine     Status: None   Collection Time: 12/13/20 10:16 AM  Result Value Ref Range   Preg Test, Ur NEGATIVE NEGATIVE    Comment:        THE SENSITIVITY OF THIS METHODOLOGY IS >20 mIU/mL. Performed at [facility], [address], [place], [place] [address]    No results found.  Review of Systems  Blood pressure 91/75, pulse 79, temperature 97.9 F (36.6 C), temperature source Oral, resp. rate 18, last menstrual period 12/06/2020, SpO2 96 %. Physical Exam HENT:     Mouth/Throat:     Mouth: Mucous membranes are moist.     Pharynx: Oropharynx is clear.  Eyes:     General: No scleral icterus. Cardiovascular:     Rate and Rhythm: Normal rate and regular rhythm.     Heart sounds: Normal heart sounds. No murmur heard. Pulmonary:     Effort: Pulmonary effort is normal.     Breath sounds: Normal breath sounds.  Abdominal:     General: There is no distension.     Palpations: Abdomen is soft. There is no mass.     Tenderness: There is no abdominal tenderness.  Musculoskeletal:        General: No swelling.     Cervical back: Neck supple.  Lymphadenopathy:     Cervical: No cervical adenopathy.  Skin:    General: Skin is warm and dry.  Neurological:     Mental Status: She is alert.      Assessment/Plan  Average risk screening colonoscopy.  Hildred Laser, MD 12/15/2020, 7:18 AM

## 2020-12-16 LAB — SURGICAL PATHOLOGY

## 2020-12-21 ENCOUNTER — Ambulatory Visit (HOSPITAL_COMMUNITY)
Admission: RE | Admit: 2020-12-21 | Discharge: 2020-12-21 | Disposition: A | Discharge status: Home / Self Care | Payer: BLUE CROSS/BLUE SHIELD | Source: Ambulatory Visit | Attending: Oncology | Admitting: Oncology

## 2020-12-21 DIAGNOSIS — Z87891 Personal history of nicotine dependence: Secondary | ICD-10-CM | POA: Diagnosis not present

## 2020-12-21 DIAGNOSIS — Z122 Encounter for screening for malignant neoplasm of respiratory organs: Secondary | ICD-10-CM | POA: Diagnosis not present

## 2020-12-22 ENCOUNTER — Encounter (HOSPITAL_COMMUNITY): Payer: Self-pay | Admitting: Internal Medicine

## 2020-12-23 ENCOUNTER — Encounter (INDEPENDENT_AMBULATORY_CARE_PROVIDER_SITE_OTHER): Payer: Self-pay | Admitting: *Deleted

## 2020-12-29 ENCOUNTER — Telehealth: Payer: Self-pay | Admitting: Family Medicine

## 2020-12-29 ENCOUNTER — Encounter (HOSPITAL_COMMUNITY): Payer: Self-pay

## 2020-12-29 NOTE — Telephone Encounter (Signed)
Per Dr.Taylor via Staff Message: Christina Collum M, DO  P [facility] Clinical Pool Pls let pt know results below of her lower dose CT.  Repeat test in 1 yr.  Has some emphysema and rest looks benign appearance.   Dr. Lovena Le         Previous Messages    ----- Message -----  From: Christina Mates, RN  Sent: 12/29/2020  10:03 AM EDT  To: Christina Colla, DO   Patient had a recent LDCT and the results are as follows:   IMPRESSION:  1. Lung-RADS 2, benign appearance or behavior. Continue annual  screening with low-dose chest CT without contrast in 12 months.  2.  Emphysema. ([id number])   I will reach out to her in about a year for follow-up.   Pt contacted and verbalized understanding

## 2020-12-29 NOTE — Progress Notes (Signed)
Patient notified of LDCT Lung Cancer Screening Results via mail with the recommendation to follow-up in 12 months. Patient's referring provider has been sent a copy of results. Results are as follows:   IMPRESSION: 1. Lung-RADS 2, benign appearance or behavior. Continue annual screening with low-dose chest CT without contrast in 12 months. 2.  Emphysema. (ICD10-J43.9)

## 2021-01-12 ENCOUNTER — Ambulatory Visit: Payer: BLUE CROSS/BLUE SHIELD | Admitting: Family Medicine

## 2021-01-12 ENCOUNTER — Other Ambulatory Visit: Payer: Self-pay

## 2021-01-12 VITALS — BP 132/64 | HR 113 | Temp 93.2°F | Wt 175.2 lb

## 2021-01-12 DIAGNOSIS — M545 Low back pain, unspecified: Secondary | ICD-10-CM | POA: Diagnosis not present

## 2021-01-12 MED ORDER — METHOCARBAMOL 500 MG PO TABS
500.0000 mg | ORAL_TABLET | Freq: Three times a day (TID) | ORAL | 0 refills | Status: DC | PRN
Start: 2021-01-12 — End: 2021-08-02

## 2021-01-12 MED ORDER — PREDNISONE 10 MG PO TABS: ORAL_TABLET | ORAL | 0 refills | Status: AC

## 2021-01-12 MED ORDER — HYDROCODONE-ACETAMINOPHEN 5-325 MG PO TABS: 1.0000 | ORAL_TABLET | Freq: Three times a day (TID) | ORAL | 0 refills | Status: DC | PRN

## 2021-01-12 NOTE — Progress Notes (Signed)
Patient ID: Christina Blake, female    DOB: [birth year], 50 y.o.   MRN: [id number]   Chief Complaint  Patient presents with   Back Pain   Subjective:    HPI  Pt having left side back pain. Pt has had previous back surgeries in the past. Has been working in the yard and is really sore. Pt states left side back pain going down left leg. Pt did take Tramadol last night.  Left lower back worst with pain and hard to lift the let leg off the floor. H/o low pain in past. No trauma or injury.  Working in yard- last 2 days.  Meds- tramadol with and xanax last night.   Medical History Christina Blake has a past medical history of Anxiety, Cervical neuralgia, Chronic back pain, Depression, History of agoraphobia (2007), Hyperlipidemia, Orthostatic hypotension, Pulmonary embolism, bilateral ([place]) (2012), and Trochanteric bursitis of both hips.   Outpatient Encounter Medications as of 01/12/2021  Medication Sig   acyclovir (ZOVIRAX) 800 MG tablet Take two po bid. Use as directed (Patient taking differently: Take 800 mg by mouth 2 (two) times daily as needed (fever blisters/cold sores.).)   ALPRAZolam (XANAX) 0.5 MG tablet Take 0.5 mg by mouth 4 (four) times daily as needed for anxiety.   Calcium Citrate-Vitamin D (CALCIUM + D PO) Take 1 tablet by mouth at bedtime.   Cholecalciferol (VITAMIN D-3) 125 MCG (5000 UT) TABS Take 5,000 Units by mouth at bedtime.   HYDROcodone-acetaminophen (NORCO) 5-325 MG tablet Take 1 tablet by mouth 3 (three) times daily as needed for moderate pain.   lamoTRIgine (LAMICTAL) 200 MG tablet Take 200 mg by mouth at bedtime.   norethindrone (MICRONOR) 0.35 MG tablet Take 1 tablet (0.35 mg total) by mouth daily. (Patient taking differently: Take 1 tablet by mouth at bedtime.)   PARoxetine (PAXIL-CR) 37.5 MG 24 hr tablet Take 75 mg by mouth at bedtime.   predniSONE (DELTASONE) 10 MG tablet Take 4 tablets (40 mg total) by mouth daily with breakfast for 2 days, THEN 3 tablets  (30 mg total) daily with breakfast for 2 days, THEN 2 tablets (20 mg total) daily with breakfast for 2 days, THEN 1 tablet (10 mg total) daily with breakfast for 2 days.   Rhubarb (ESTROVEN MENOPAUSE RELIEF) 4 MG TABS Take 4 mg by mouth daily.   temazepam (RESTORIL) 30 MG capsule Take 30 mg by mouth at bedtime.   traMADol (ULTRAM) 50 MG tablet TAKE (1) TABLET BY MOUTH EVERY 6 HOURS AS NEEDED FOR PAIN. (Patient taking differently: Take 50 mg by mouth every 6 (six) hours as needed for severe pain.)   traZODone (DESYREL) 150 MG tablet Take 300 mg by mouth at bedtime.   [DISCONTINUED] methocarbamol (ROBAXIN) 500 MG tablet Take by mouth.   methocarbamol (ROBAXIN) 500 MG tablet Take 1 tablet (500 mg total) by mouth every 8 (eight) hours as needed for muscle spasms.   No facility-administered encounter medications on file as of 01/12/2021.     Review of Systems  Constitutional:  Negative for chills and fever.  HENT:  Negative for congestion, rhinorrhea and sore throat.   Respiratory:  Negative for cough, shortness of breath and wheezing.   Cardiovascular:  Negative for chest pain and leg swelling.  Gastrointestinal:  Negative for abdominal pain, diarrhea, nausea and vomiting.  Genitourinary:  Negative for dysuria and frequency.  Musculoskeletal:  Positive for back pain. Negative for arthralgias.  Skin:  Negative for rash.  Neurological:  Negative  for dizziness, weakness and headaches.    Vitals BP 132/64   Pulse (!) 113   Temp (!) 93.2 F (34 C)   Wt 175 lb 3.2 oz (79.5 kg)   SpO2 99%   BMI 27.44 kg/m   Objective:   Physical Exam Vitals and nursing note reviewed.  Constitutional:      General: She is not in acute distress.    Appearance: Normal appearance.  HENT:     Head: Normocephalic and atraumatic.  Cardiovascular:     Rate and Rhythm: Normal rate and regular rhythm.     Pulses: Normal pulses.     Heart sounds: Normal heart sounds.  Pulmonary:     Effort: Pulmonary effort  is normal.     Breath sounds: Normal breath sounds. No wheezing, rhonchi or rales.  Musculoskeletal:     Right lower leg: No edema.     Left lower leg: No edema.  Skin:    General: Skin is warm and dry.     Findings: No lesion or rash.  Neurological:     General: No focal deficit present.     Mental Status: She is alert and oriented to person, place, and time.     Cranial Nerves: No cranial nerve deficit.     Motor: No weakness.  Psychiatric:        Mood and Affect: Mood normal.        Behavior: Behavior normal.   Back- +left slr is positive. Neg on right.  +ttp over lumbar paraspinal area and left SI joint ttp. No lumbar or thoracic spinous process tenderness.  Dec rom with flexion. Normal ms LE bilaterally and normal sensation.   Assessment and Plan   1. Lumbar pain - predniSONE (DELTASONE) 10 MG tablet; Take 4 tablets (40 mg total) by mouth daily with breakfast for 2 days, THEN 3 tablets (30 mg total) daily with breakfast for 2 days, THEN 2 tablets (20 mg total) daily with breakfast for 2 days, THEN 1 tablet (10 mg total) daily with breakfast for 2 days.  Dispense: 20 tablet; Refill: 0 - HYDROcodone-acetaminophen (NORCO) 5-325 MG tablet; Take 1 tablet by mouth 3 (three) times daily as needed for moderate pain.  Dispense: 15 tablet; Refill: 0 - methocarbamol (ROBAXIN) 500 MG tablet; Take 1 tablet (500 mg total) by mouth every 8 (eight) hours as needed for muscle spasms.  Dispense: 30 tablet; Refill: 0   Left lower back pain/SI joint pain- Gave norco, prednisone taper, and robaxin. Heat/ice prn. Cautioned with using robaxin when has xanax and temazepam use.  Advising to avoid taking at same time or pick one or the other when taking for back pain Pt in agreement.  Return if symptoms worsen or fail to improve.

## 2021-01-12 NOTE — Patient Instructions (Signed)
Acute Back Pain, Adult Acute back pain is sudden and usually short-lived. It is often caused by an injury to the muscles and tissues in the back. The injury may result from: A muscle or ligament getting overstretched or torn (strained). Ligaments are tissues that connect bones to each other. Lifting something improperly can cause a back strain. Wear and tear (degeneration) of the spinal disks. Spinal disks are circular tissue that provide cushioning between the bones of the spine (vertebrae). Twisting motions, such as while playing sports or doing yard work. A hit to the back. Arthritis. You may have a physical exam, lab tests, and imaging tests to find the cause ofyour pain. Acute back pain usually goes away with rest and home care. Follow these instructions at home: Managing pain, stiffness, and swelling Treatment may include medicines for pain and inflammation that are taken by mouth or applied to the skin, prescription pain medicine, or muscle relaxants. Take over-the-counter and prescription medicines only as told by your health care provider. Your health care provider may recommend applying ice during the first 24-48 hours after your pain starts. To do this: Put ice in a plastic bag. Place a towel between your skin and the bag. Leave the ice on for 20 minutes, 2-3 times a day. If directed, apply heat to the affected area as often as told by your health care provider. Use the heat source that your health care provider recommends, such as a moist heat pack or a heating pad. Place a towel between your skin and the heat source. Leave the heat on for 20-30 minutes. Remove the heat if your skin turns bright red. This is especially important if you are unable to feel pain, heat, or cold. You have a greater risk of getting burned. Activity  Do not stay in bed. Staying in bed for more than 1-2 days can delay your recovery. Sit up and stand up straight. Avoid leaning forward when you sit or  hunching over when you stand. If you work at a desk, sit close to it so you do not need to lean over. Keep your chin tucked in. Keep your neck drawn back, and keep your elbows bent at a 90-degree angle (right angle). Sit high and close to the steering wheel when you drive. Add lower back (lumbar) support to your car seat, if needed. Take short walks on even surfaces as soon as you are able. Try to increase the length of time you walk each day. Do not sit, drive, or stand in one place for more than 30 minutes at a time. Sitting or standing for long periods of time can put stress on your back. Do not drive or use heavy machinery while taking prescription pain medicine. Use proper lifting techniques. When you bend and lift, use positions that put less stress on your back: Bend your knees. Keep the load close to your body. Avoid twisting. Exercise regularly as told by your health care provider. Exercising helps your back heal faster and helps prevent back injuries by keeping muscles strong and flexible. Work with a physical therapist to make a safe exercise program, as recommended by your health care provider. Do any exercises as told by your physical therapist.  Lifestyle Maintain a healthy weight. Extra weight puts stress on your back and makes it difficult to have good posture. Avoid activities or situations that make you feel anxious or stressed. Stress and anxiety increase muscle tension and can make back pain worse. Learn ways to manage   anxiety and stress, such as through exercise. General instructions Sleep on a firm mattress in a comfortable position. Try lying on your side with your knees slightly bent. If you lie on your back, put a pillow under your knees. Follow your treatment plan as told by your health care provider. This may include: Cognitive or behavioral therapy. Acupuncture or massage therapy. Meditation or yoga. Contact a health care provider if: You have pain that is not  relieved with rest or medicine. You have increasing pain going down into your legs or buttocks. Your pain does not improve after 2 weeks. You have pain at night. You lose weight without trying. You have a fever or chills. Get help right away if: You develop new bowel or bladder control problems. You have unusual weakness or numbness in your arms or legs. You develop nausea or vomiting. You develop abdominal pain. You feel faint. Summary Acute back pain is sudden and usually short-lived. Use proper lifting techniques. When you bend and lift, use positions that put less stress on your back. Take over-the-counter and prescription medicines and apply heat or ice as directed by your health care provider. This information is not intended to replace advice given to you by your health care provider. Make sure you discuss any questions you have with your healthcare provider. Document Revised: 03/09/2020 Document Reviewed: 03/12/2020 Elsevier Patient Education  2022 [employer]vier Inc.  

## 2021-03-27 DIAGNOSIS — M545 Low back pain, unspecified: Secondary | ICD-10-CM | POA: Diagnosis not present

## 2021-06-30 ENCOUNTER — Telehealth: Payer: Self-pay | Admitting: Nurse Practitioner

## 2021-07-01 NOTE — Telephone Encounter (Signed)
Could not find my chart account. Please remind Christina Blake that she needs her yearly physical. I will send in a few refills on her hormone pills to give her time to make an appointment. Thanks

## 2021-07-28 NOTE — Telephone Encounter (Signed)
Has appointment on 08/02/21

## 2021-08-02 ENCOUNTER — Other Ambulatory Visit: Payer: Self-pay

## 2021-08-02 ENCOUNTER — Ambulatory Visit (INDEPENDENT_AMBULATORY_CARE_PROVIDER_SITE_OTHER): Payer: 59 | Admitting: Family Medicine

## 2021-08-02 DIAGNOSIS — M545 Low back pain, unspecified: Secondary | ICD-10-CM | POA: Insufficient documentation

## 2021-08-02 DIAGNOSIS — G8929 Other chronic pain: Secondary | ICD-10-CM

## 2021-08-02 DIAGNOSIS — M5412 Radiculopathy, cervical region: Secondary | ICD-10-CM | POA: Diagnosis not present

## 2021-08-02 DIAGNOSIS — Z0000 Encounter for general adult medical examination without abnormal findings: Secondary | ICD-10-CM | POA: Insufficient documentation

## 2021-08-02 DIAGNOSIS — M5440 Lumbago with sciatica, unspecified side: Secondary | ICD-10-CM | POA: Insufficient documentation

## 2021-08-02 DIAGNOSIS — E785 Hyperlipidemia, unspecified: Secondary | ICD-10-CM | POA: Insufficient documentation

## 2021-08-02 MED ORDER — METHOCARBAMOL 500 MG PO TABS: 500.0000 mg | ORAL_TABLET | Freq: Three times a day (TID) | ORAL | 1 refills | Status: DC | PRN

## 2021-08-02 MED ORDER — NORETHINDRONE 0.35 MG PO TABS: 1.0000 | ORAL_TABLET | Freq: Every day | ORAL | 1 refills | Status: DC

## 2021-08-02 MED ORDER — HYDROCODONE-ACETAMINOPHEN 5-325 MG PO TABS: 1.0000 | ORAL_TABLET | Freq: Three times a day (TID) | ORAL | 0 refills | Status: DC | PRN

## 2021-08-02 NOTE — Assessment & Plan Note (Addendum)
Robaxin refilled today. PRN Hydrocodone.

## 2021-08-02 NOTE — Assessment & Plan Note (Signed)
Follow up later this year for Pap smear and labs. Advised to schedule Mammogram.

## 2021-08-02 NOTE — Progress Notes (Signed)
Subjective:  Patient ID: Christina Blake, female    DOB: [birth year]  Age: 51 y.o. MRN: [id number]  CC: Chief Complaint  Patient presents with   Medication Refill    HPI:  51 year old female with depression, anxiety, sleep disturbance, hyperlipidemia presents for follow-up.  Patient needs refill on her Micronor.  She is due for mammogram as well as Pap smear.  I advised her to schedule her mammogram and to return later this year to see our nurse practitioner for her Pap smear.  Hoyle Sauer has previously performed her Pap smears.  Patient sees a Designer, jewellery regarding her mental health.  She is on Lamictal, Xanax, temazepam, and trazodone.  We do not manage these medications.  Patient is a current smoker.  She inquired about Chantix.  I did not think Chantix is a good option for her in the setting of her other medications.  Advised patient that she needs to cut back and try and quit.  Patient states that she has intermittent pain associated with her neck and low back.  She has had prior cervical fusion as well as lumbar fusion.  Patient states that occasionally she has to use hydrocodone for severe pain.  She would like to have this on hand if she needs it.  Last prescribed in July 2022.  [place] controlled substance database was reviewed.  Patient also notes that she has recently had some ear discomfort.  She said some cough and sneezing as well.   Patient Active Problem List   Diagnosis Date Noted   Hyperlipidemia 08/02/2021   Low back pain 08/02/2021   Preventative health care 08/02/2021   History of IBS 07/29/2020   Urinary incontinence 06/20/2020   Menorrhagia with regular cycle 06/20/2020   Herniated cervical disc 09/04/2014   Cervical neuralgia 07/28/2014    Social Hx   Social History   Socioeconomic History   Marital status: Married    Spouse name: Not on file   Number of children: Not on file   Years of education: Not on file   Highest education level:  Not on file  Occupational History   Not on file  Tobacco Use   Smoking status: Every Day    Packs/day: 2.00    Years: 20.00    Pack years: 40.00    Types: Cigarettes   Smokeless tobacco: Never  Vaping Use   Vaping Use: Never used  Substance and Sexual Activity   Alcohol use: No    Alcohol/week: 0.0 standard drinks   Drug use: No   Sexual activity: Yes    Birth control/protection: None  Other Topics Concern   Not on file  Social History Narrative   Not on file   Social Determinants of Health   Financial Resource Strain: Not on file  Food Insecurity: Not on file  Transportation Needs: Not on file  Physical Activity: Not on file  Stress: Not on file  Social Connections: Not on file    Review of Systems Per HPI  Objective:  BP 120/78    Pulse 94    Temp 98.6 F (37 C)    Ht 5\' 7"  (1.702 m)    Wt 183 lb (83 kg)    SpO2 98%    BMI 28.66 kg/m   BP/Weight 08/02/2021 01/12/2021 8/83/3744  Systolic BP 514 604 96  Diastolic BP 78 64 66  Wt. (Lbs) 183 175.2 -  BMI 28.66 27.44 -  Some encounter information is confidential and restricted. Go to Review  Flowsheets activity to see all data.    Physical Exam Vitals and nursing note reviewed.  Constitutional:      General: She is not in acute distress.    Appearance: Normal appearance. She is not ill-appearing.  HENT:     Head: Normocephalic and atraumatic.     Right Ear: Tympanic membrane normal.     Left Ear: Tympanic membrane normal.  Eyes:     General:        Right eye: No discharge.        Left eye: No discharge.     Conjunctiva/sclera: Conjunctivae normal.  Cardiovascular:     Rate and Rhythm: Normal rate and regular rhythm.  Pulmonary:     Effort: Pulmonary effort is normal.     Breath sounds: Normal breath sounds. No wheezing, rhonchi or rales.  Abdominal:     General: There is no distension.     Palpations: Abdomen is soft.     Tenderness: There is no abdominal tenderness.  Neurological:     Mental Status:  She is alert.  Psychiatric:        Mood and Affect: Mood normal.        Behavior: Behavior normal.    Lab Results  Component Value Date   WBC 6.7 07/01/2020   HGB 14.2 07/01/2020   HCT 42.5 07/01/2020   PLT 210 07/01/2020   GLUCOSE 96 07/01/2020   CHOL 190 07/01/2020   TRIG 56 07/01/2020   HDL 48 07/01/2020   LDLCALC 131 (H) 07/01/2020   ALT 13 07/01/2020   AST 13 07/01/2020   NA 137 07/01/2020   K 4.2 07/01/2020   CL 102 07/01/2020   CREATININE 0.83 07/01/2020   BUN 10 07/01/2020   CO2 20 07/01/2020   TSH 1.820 07/01/2020   INR 2.45 (H) 08/28/2011     Assessment & Plan:   Problem List Items Addressed This Visit       Other   Cervical neuralgia    PRN Hydrocodone and muscle relaxant.      Relevant Medications   methocarbamol (ROBAXIN) 500 MG tablet   HYDROcodone-acetaminophen (NORCO) 5-325 MG tablet   Low back pain    Robaxin refilled today. PRN Hydrocodone.      Relevant Medications   methocarbamol (ROBAXIN) 500 MG tablet   HYDROcodone-acetaminophen (NORCO) 5-325 MG tablet   Preventative health care    Follow up later this year for Pap smear and labs. Advised to schedule Mammogram.       Meds ordered this encounter  Medications   norethindrone (MICRONOR) 0.35 MG tablet    Sig: Take 1 tablet (0.35 mg total) by mouth daily.    Dispense:  90 tablet    Refill:  1   methocarbamol (ROBAXIN) 500 MG tablet    Sig: Take 1 tablet (500 mg total) by mouth every 8 (eight) hours as needed for muscle spasms.    Dispense:  90 tablet    Refill:  1   HYDROcodone-acetaminophen (NORCO) 5-325 MG tablet    Sig: Take 1 tablet by mouth every 8 (eight) hours as needed for severe pain.    Dispense:  10 tablet    Refill:  0    Follow-up:  Later this year  [place]

## 2021-08-02 NOTE — Patient Instructions (Addendum)
Call and schedule mammogram.  Return later this year for labs and pap smear (with our NP).  Take care  Dr. Lacinda Axon

## 2021-08-02 NOTE — Assessment & Plan Note (Signed)
PRN Hydrocodone and muscle relaxant.

## 2021-10-12 LAB — HM MAMMOGRAPHY

## 2021-10-14 ENCOUNTER — Ambulatory Visit (INDEPENDENT_AMBULATORY_CARE_PROVIDER_SITE_OTHER): Payer: 59 | Admitting: Family Medicine

## 2021-10-14 VITALS — BP 102/68 | HR 85 | Temp 98.4°F | Ht 67.0 in | Wt 184.2 lb

## 2021-10-14 DIAGNOSIS — M545 Low back pain, unspecified: Secondary | ICD-10-CM

## 2021-10-14 DIAGNOSIS — G8929 Other chronic pain: Secondary | ICD-10-CM | POA: Diagnosis not present

## 2021-10-14 MED ORDER — HYDROCODONE-ACETAMINOPHEN 5-325 MG PO TABS: 1.0000 | ORAL_TABLET | Freq: Three times a day (TID) | ORAL | 0 refills | Status: DC | PRN

## 2021-10-14 NOTE — Assessment & Plan Note (Signed)
Patient with acute exacerbation of chronic back pain.  Treating with hydrocodone.  [place] controlled substance database reviewed. ?

## 2021-10-14 NOTE — Progress Notes (Signed)
? ?Subjective:  ?Patient ID: Christina Blake, female    DOB: June 04, 1971  Age: 50 y.o. MRN: [id number] ? ?CC: ?Chief Complaint  ?Patient presents with  ? Back Pain  ?  Chronic issue but has gotten worse this week. Can barely walk  ? ? ?HPI: ? ?51 year old female presents with the above complaint. ? ?Patient states that she has been experiencing worsening back pain for the last week.  She states it is located in the low back.  She has been taking Tylenol, ibuprofen, and Robaxin without relief.  She has also been using heat.  She has known history of chronic back pain and has had prior lumbar surgery.  Denies any fall, trauma, injury.  No known inciting factor.  Pain is severe.  No radicular symptoms. ? ?Patient Active Problem List  ? Diagnosis Date Noted  ? Chronic back pain 10/14/2021  ? Hyperlipidemia 08/02/2021  ? Preventative health care 08/02/2021  ? History of IBS 07/29/2020  ? Urinary incontinence 06/20/2020  ? Menorrhagia with regular cycle 06/20/2020  ? Herniated cervical disc 09/04/2014  ? Cervical neuralgia 07/28/2014  ? ? ?Social Hx   ?Social History  ? ?Socioeconomic History  ? Marital status: Married  ?  Spouse name: Not on file  ? Number of children: Not on file  ? Years of education: Not on file  ? Highest education level: Not on file  ?Occupational History  ? Not on file  ?Tobacco Use  ? Smoking status: Every Day  ?  Packs/day: 2.00  ?  Years: 20.00  ?  Pack years: 40.00  ?  Types: Cigarettes  ? Smokeless tobacco: Never  ?Vaping Use  ? Vaping Use: Never used  ?Substance and Sexual Activity  ? Alcohol use: No  ?  Alcohol/week: 0.0 standard drinks  ? Drug use: No  ? Sexual activity: Yes  ?  Birth control/protection: None  ?Other Topics Concern  ? Not on file  ?Social History Narrative  ? Not on file  ? ?Social Determinants of Health  ? ?Financial Resource Strain: Not on file  ?Food Insecurity: Not on file  ?Transportation Needs: Not on file  ?Physical Activity: Not on file  ?Stress: Not on file   ?Social Connections: Not on file  ? ? ?Review of Systems ?Per HPI ? ?Objective:  ?BP 102/68   Pulse 85   Temp 98.4 ?F (36.9 ?C) (Oral)   Ht '5\' 7"'$  (1.702 m)   Wt 184 lb 3.2 oz (83.6 kg)   SpO2 99%   BMI 28.85 kg/m?  ? ? ?  10/14/2021  ? 10:16 AM 08/02/2021  ?  8:22 AM 01/12/2021  ? 10:20 AM  ?BP/Weight  ?Systolic BP 875 643 329  ?Diastolic BP 68 78 64  ?Wt. (Lbs) 184.2 183 175.2  ?BMI 28.85 kg/m2 28.66 kg/m2 27.44 kg/m2  ? ? ?Physical Exam ?Vitals and nursing note reviewed.  ?Constitutional:   ?   General: She is not in acute distress. ?   Appearance: Normal appearance. She is not ill-appearing.  ?HENT:  ?   Head: Normocephalic and atraumatic.  ?Eyes:  ?   General:     ?   Right eye: No discharge.     ?   Left eye: No discharge.  ?   Conjunctiva/sclera: Conjunctivae normal.  ?Cardiovascular:  ?   Rate and Rhythm: Normal rate and regular rhythm.  ?   Heart sounds: No murmur heard. ?Pulmonary:  ?   Effort: Pulmonary effort is normal.  ?  Breath sounds: Normal breath sounds. No wheezing, rhonchi or rales.  ?Musculoskeletal:  ?   Comments: Lumbar spine with spasm noted.  ?Neurological:  ?   Mental Status: She is alert.  ? ? ?Lab Results  ?Component Value Date  ? WBC 6.7 07/01/2020  ? HGB 14.2 07/01/2020  ? HCT 42.5 07/01/2020  ? PLT 210 07/01/2020  ? GLUCOSE 96 07/01/2020  ? CHOL 190 07/01/2020  ? TRIG 56 07/01/2020  ? HDL 48 07/01/2020  ? LDLCALC 131 (H) 07/01/2020  ? ALT 13 07/01/2020  ? AST 13 07/01/2020  ? NA 137 07/01/2020  ? K 4.2 07/01/2020  ? CL 102 07/01/2020  ? CREATININE 0.83 07/01/2020  ? BUN 10 07/01/2020  ? CO2 20 07/01/2020  ? TSH 1.820 07/01/2020  ? INR 2.45 (H) 08/28/2011  ? ? ? ?Assessment & Plan:  ? ?Problem List Items Addressed This Visit   ? ?  ? Other  ? Chronic back pain - Primary  ?  Patient with acute exacerbation of chronic back pain.  Treating with hydrocodone.  [place] controlled substance database reviewed. ?  ?  ? Relevant Medications  ? HYDROcodone-acetaminophen (NORCO) 5-325 MG  tablet  ? ? ?Meds ordered this encounter  ?Medications  ? HYDROcodone-acetaminophen (NORCO) 5-325 MG tablet  ?  Sig: Take 1 tablet by mouth every 8 (eight) hours as needed for severe pain.  ?  Dispense:  15 tablet  ?  Refill:  0  ? ? ?Thersa Salt DO ?[place] ? ?

## 2021-10-14 NOTE — Patient Instructions (Signed)
Recommend heat.  Continue use of muscle relaxer.  Pain medication as directed. ? ?Take care ? ?Dr. Lacinda Axon  ?

## 2021-12-12 ENCOUNTER — Encounter: Payer: Self-pay | Admitting: Nurse Practitioner

## 2021-12-12 ENCOUNTER — Ambulatory Visit: Payer: BLUE CROSS/BLUE SHIELD | Admitting: Nurse Practitioner

## 2021-12-12 ENCOUNTER — Ambulatory Visit (HOSPITAL_COMMUNITY)
Admission: RE | Admit: 2021-12-12 | Discharge: 2021-12-12 | Disposition: A | Discharge status: Home / Self Care | Payer: 59 | Source: Ambulatory Visit | Attending: Nurse Practitioner | Admitting: Nurse Practitioner

## 2021-12-12 VITALS — BP 105/74 | HR 79 | Temp 97.3°F | Ht 67.0 in | Wt 184.0 lb

## 2021-12-12 DIAGNOSIS — M545 Low back pain, unspecified: Secondary | ICD-10-CM | POA: Insufficient documentation

## 2021-12-12 DIAGNOSIS — G8929 Other chronic pain: Secondary | ICD-10-CM | POA: Diagnosis not present

## 2021-12-12 MED ORDER — CYCLOBENZAPRINE HCL 5 MG PO TABS: 5.0000 mg | ORAL_TABLET | Freq: Three times a day (TID) | ORAL | 1 refills | Status: DC | PRN

## 2021-12-12 MED ORDER — HYDROCODONE-ACETAMINOPHEN 5-325 MG PO TABS: 1.0000 | ORAL_TABLET | Freq: Three times a day (TID) | ORAL | 0 refills | Status: DC | PRN

## 2021-12-12 MED ORDER — PREDNISONE 20 MG PO TABS: ORAL_TABLET | ORAL | 0 refills | Status: DC

## 2021-12-12 NOTE — Progress Notes (Signed)
Subjective:    Patient ID: Christina Blake, female    DOB: [birth year], 51 y.o.   MRN: [id number]  Back Pain This is a new problem. The current episode started in the past 7 days. The pain is present in the lumbar spine.   51 year old female patient with history of chronic back pain presents to the clinic today for worsening back pain.  Pt states was doing yard work on 2 days ago where she was doing a lot of repetitive bending motions.  Patient states that after working in the yard she could barely move.  Patient has history of lumbar spinal fusion.  Patient currently takes Robaxin to manage pain however patient states that Robaxin has not been helpful.  Patient would like to know if her Robaxin can be switched to Flexeril as she seemed to have more success with Flexeril in the past.  Patient also requesting a stronger pain medicine to help ease her pain and  prednisone dose pack if applicable because prednisone is helped her in the past.   Review of Systems  Musculoskeletal:  Positive for back pain.  All other systems reviewed and are negative.      Objective:   Physical Exam Constitutional:      General: She is not in acute distress.    Appearance: Normal appearance. She is normal weight. She is not ill-appearing, toxic-appearing or diaphoretic.  HENT:     Head: Normocephalic.  Cardiovascular:     Rate and Rhythm: Normal rate and regular rhythm.     Pulses: Normal pulses.     Heart sounds: Normal heart sounds. No murmur heard. Pulmonary:     Effort: Pulmonary effort is normal. No respiratory distress.     Breath sounds: Normal breath sounds. No wheezing.  Musculoskeletal:        General: Tenderness present.     Cervical back: Normal range of motion and neck supple. No rigidity or tenderness.     Lumbar back: Spasms and tenderness present. Decreased range of motion.     Comments: Tens unit to lower back.  Obvious stiffness and noticeable decreased range of motion to lower  back.  Patient has pain with rising from sitting to a standing position, standing, and walking.  Patient unable to twist torso due to pain.  Lymphadenopathy:     Cervical: No cervical adenopathy.  Skin:    General: Skin is warm.     Capillary Refill: Capillary refill takes less than 2 seconds.  Neurological:     Mental Status: She is alert.     Comments: Grossly intact           Assessment & Plan:   1. Chronic low back pain without sciatica, unspecified back pain laterality -Due to patient having frequent episodes of acute pain recommend that patient be reevaluated with neurosurgery. - DG Lumbar Spine Complete - Ambulatory referral to Neurosurgery - predniSONE (DELTASONE) 20 MG tablet; Take three for 3 days, two for 3 days and then one for 3 days  Dispense: 18 tablet; Refill: 0 - HYDROcodone-acetaminophen (NORCO/VICODIN) 5-325 MG tablet; Take 1 tablet by mouth every 8 (eight) hours as needed for moderate pain.  Dispense: 24 tablet; Refill: 0 - cyclobenzaprine (FLEXERIL) 5 MG tablet; Take 1 tablet (5 mg total) by mouth 3 (three) times daily as needed for muscle spasms.  Dispense: 30 tablet; Refill: 1 -Discussed in detail the dangers of combining Flexeril, Vicodin, Xanax, and sleep medication.  Patient instructed to only take 1 of  those types of medications next time to prevent oversedation or respiratory depression.  Patient stated understanding.    Note:  This document was prepared using Dragon voice recognition software and may include unintentional dictation errors. Note - This record has been created using [employer].  Chart creation errors have been sought, but may not always  have been located. Such creation errors do not reflect on  the standard of medical care.

## 2021-12-14 ENCOUNTER — Other Ambulatory Visit: Payer: Self-pay | Admitting: Nurse Practitioner

## 2021-12-14 DIAGNOSIS — M545 Low back pain, unspecified: Secondary | ICD-10-CM

## 2021-12-20 ENCOUNTER — Telehealth: Payer: Self-pay

## 2021-12-20 NOTE — Telephone Encounter (Signed)
LMTRC

## 2021-12-20 NOTE — Telephone Encounter (Signed)
Caller name:Karryn Jimmye Norman   On DPR? :Yes  Call back Renick  Provider they see: Barbee Shropshire   Reason for call:Pt seen Barbee Shropshire last week she ordered x-ray done same day she has bulging disk and wants to know if Barbee Shropshire is going to order MRI before she sees Dr Saintclair Halsted?

## 2021-12-21 NOTE — Telephone Encounter (Signed)
Sent message to [place] regarding MRI.

## 2022-01-14 ENCOUNTER — Other Ambulatory Visit: Payer: Self-pay | Admitting: Family Medicine

## 2022-01-14 DIAGNOSIS — G8929 Other chronic pain: Secondary | ICD-10-CM

## 2022-01-14 DIAGNOSIS — M5412 Radiculopathy, cervical region: Secondary | ICD-10-CM

## 2022-01-16 ENCOUNTER — Other Ambulatory Visit: Payer: Self-pay | Admitting: Nurse Practitioner

## 2022-01-16 DIAGNOSIS — M545 Low back pain, unspecified: Secondary | ICD-10-CM

## 2022-01-31 ENCOUNTER — Other Ambulatory Visit: Payer: Self-pay | Admitting: *Deleted

## 2022-01-31 ENCOUNTER — Encounter: Payer: Self-pay | Admitting: *Deleted

## 2022-01-31 DIAGNOSIS — M545 Low back pain, unspecified: Secondary | ICD-10-CM

## 2022-01-31 NOTE — Telephone Encounter (Signed)
Could you look into this please? Let us know if we need to do anything. Thank you!

## 2022-02-22 ENCOUNTER — Ambulatory Visit (HOSPITAL_COMMUNITY): Payer: 59

## 2022-03-09 ENCOUNTER — Encounter: Payer: Self-pay | Admitting: *Deleted

## 2022-04-25 ENCOUNTER — Telehealth: Payer: Self-pay | Admitting: Family Medicine

## 2022-04-25 ENCOUNTER — Other Ambulatory Visit: Payer: Self-pay | Admitting: Family Medicine

## 2022-04-25 MED ORDER — NORETHINDRONE 0.35 MG PO TABS: 1.0000 | ORAL_TABLET | Freq: Every day | ORAL | 1 refills | Status: DC

## 2022-04-25 NOTE — Telephone Encounter (Signed)
Patient is requesting new prescription for birth control to sent to [facility] pharmacy in [place]

## 2022-07-04 ENCOUNTER — Other Ambulatory Visit: Payer: Self-pay

## 2022-07-04 DIAGNOSIS — Z122 Encounter for screening for malignant neoplasm of respiratory organs: Secondary | ICD-10-CM

## 2022-07-04 DIAGNOSIS — Z87891 Personal history of nicotine dependence: Secondary | ICD-10-CM

## 2022-07-04 NOTE — Progress Notes (Signed)
LDCT order placed per protocol. Scan scheduled for 2/6 at 0800

## 2022-08-08 ENCOUNTER — Ambulatory Visit (HOSPITAL_COMMUNITY)
Admission: RE | Admit: 2022-08-08 | Discharge: 2022-08-08 | Disposition: A | Discharge status: Home / Self Care | Payer: 59 | Source: Ambulatory Visit | Attending: Physician Assistant | Admitting: Physician Assistant

## 2022-08-08 DIAGNOSIS — Z87891 Personal history of nicotine dependence: Secondary | ICD-10-CM | POA: Insufficient documentation

## 2022-08-08 DIAGNOSIS — Z122 Encounter for screening for malignant neoplasm of respiratory organs: Secondary | ICD-10-CM

## 2022-08-09 NOTE — Progress Notes (Signed)
Patient notified of LDCT Lung Cancer Screening Results via mail with the recommendation to follow-up in 12 months. Patient's referring provider has been sent a copy of results. Results are as follows:  IMPRESSION: Lung-RADS 2, benign appearance or behavior. Continue annual screening with low-dose chest CT without contrast in 12 months.   Emphysema (ICD10-J43.9).

## 2022-11-30 LAB — HM MAMMOGRAPHY

## 2023-04-16 ENCOUNTER — Ambulatory Visit: Payer: 59 | Admitting: Family Medicine

## 2023-07-05 ENCOUNTER — Other Ambulatory Visit: Payer: Self-pay

## 2023-07-05 DIAGNOSIS — Z87891 Personal history of nicotine dependence: Secondary | ICD-10-CM

## 2023-07-05 DIAGNOSIS — Z122 Encounter for screening for malignant neoplasm of respiratory organs: Secondary | ICD-10-CM

## 2023-07-06 LAB — LAB REPORT - SCANNED
A1c: 5.5
EGFR: 80

## 2023-08-09 ENCOUNTER — Ambulatory Visit (HOSPITAL_COMMUNITY)
Admission: RE | Admit: 2023-08-09 | Discharge: 2023-08-09 | Disposition: A | Discharge status: Home / Self Care | Payer: 59 | Source: Ambulatory Visit | Attending: Physician Assistant | Admitting: Physician Assistant

## 2023-08-09 DIAGNOSIS — Z122 Encounter for screening for malignant neoplasm of respiratory organs: Secondary | ICD-10-CM | POA: Diagnosis present

## 2023-08-09 DIAGNOSIS — Z87891 Personal history of nicotine dependence: Secondary | ICD-10-CM | POA: Diagnosis present

## 2023-08-13 ENCOUNTER — Ambulatory Visit: Payer: 59 | Admitting: Family Medicine

## 2023-08-13 ENCOUNTER — Encounter: Payer: Self-pay | Admitting: Family Medicine

## 2023-08-13 VITALS — BP 93/67 | HR 93 | Temp 97.5°F | Ht 67.0 in | Wt 211.0 lb

## 2023-08-13 DIAGNOSIS — M545 Low back pain, unspecified: Secondary | ICD-10-CM

## 2023-08-13 DIAGNOSIS — N3001 Acute cystitis with hematuria: Secondary | ICD-10-CM | POA: Diagnosis not present

## 2023-08-13 DIAGNOSIS — G8929 Other chronic pain: Secondary | ICD-10-CM | POA: Diagnosis not present

## 2023-08-13 LAB — POCT URINALYSIS DIP (CLINITEK)
Bilirubin, UA: NEGATIVE
Glucose, UA: NEGATIVE mg/dL
Ketones, POC UA: NEGATIVE mg/dL
Nitrite, UA: POSITIVE — AB
POC PROTEIN,UA: 30 — AB
Spec Grav, UA: >=1.03 — AB (ref 1.010–1.025)
Urobilinogen, UA: 0.2 U/dL
pH, UA: 6 (ref 5.0–8.0)

## 2023-08-13 MED ORDER — NITROFURANTOIN MONOHYD MACRO 100 MG PO CAPS: 100.0000 mg | ORAL_CAPSULE | Freq: Two times a day (BID) | ORAL | 0 refills | Status: DC

## 2023-08-13 MED ORDER — METHOCARBAMOL 750 MG PO TABS: 750.0000 mg | ORAL_TABLET | Freq: Four times a day (QID) | ORAL | 5 refills | Status: AC | PRN

## 2023-08-13 MED ORDER — PREDNISONE 10 MG PO TABS: ORAL_TABLET | ORAL | 0 refills | Status: DC

## 2023-08-13 MED ORDER — HYDROCODONE-ACETAMINOPHEN 5-325 MG PO TABS: 1.0000 | ORAL_TABLET | Freq: Three times a day (TID) | ORAL | 0 refills | Status: DC | PRN

## 2023-08-13 NOTE — Patient Instructions (Addendum)
Medications as prescribed.  Follow up in 3-6 months.  Take care  Dr. Cook  

## 2023-08-13 NOTE — Progress Notes (Signed)
 Subjective:  Patient ID: Christina Blake, female    DOB: [birth year]  Age: 53 y.o. MRN: [id number]  CC:   Chief Complaint  Patient presents with   low back and neck pain chronic worse now   Dysuria    Since friday    HPI:  53 year old female presents for evaluation of the above.  Patient reports recent worsening of chronic low back pain.  Located low back, bilateral.  She reports that she has difficulty sleeping and getting comfortable.  Has significant difficulty with activities as well.  Has been using Tylenol  and ibuprofen  without relief.  Has had prior lumbar surgery.  Patient also reports a 3-day history of urinary symptoms.  Reports dysuria, frequency, hesitancy, and urinary incontinence.  No abdominal pain.  No fever.  She is concerned that she has UTI.  Patient Active Problem List   Diagnosis Date Noted   Acute exacerbation of chronic low back pain 08/13/2023   Acute cystitis with hematuria 08/13/2023   Hyperlipidemia 08/02/2021   Preventative health care 08/02/2021   History of IBS 07/29/2020   Menorrhagia with regular cycle 06/20/2020   Cervical neuralgia 07/28/2014    Social Hx   Social History   Socioeconomic History   Marital status: Married    Spouse name: Not on file   Number of children: Not on file   Years of education: Not on file   Highest education level: Not on file  Occupational History   Not on file  Tobacco Use   Smoking status: Every Day    Current packs/day: 2.00    Average packs/day: 2.0 packs/day for 20.0 years (40.0 ttl pk-yrs)    Types: Cigarettes   Smokeless tobacco: Never  Vaping Use   Vaping status: Never Used  Substance and Sexual Activity   Alcohol use: No    Alcohol/week: 0.0 standard drinks of alcohol   Drug use: No   Sexual activity: Yes    Birth control/protection: None  Other Topics Concern   Not on file  Social History Narrative   Not on file   Social Drivers of Health   Financial Resource Strain: Not on file   Food Insecurity: Not on file  Transportation Needs: Not on file  Physical Activity: Not on file  Stress: Not on file  Social Connections: Not on file    Review of Systems Per HPI  Objective:  BP 93/67   Pulse 93   Temp (!) 97.5 F (36.4 C)   Ht 5\' 7"  (1.702 m)   Wt 211 lb (95.7 kg)   SpO2 98%   BMI 33.05 kg/m      08/13/2023    8:59 AM 12/12/2021    9:02 AM 10/14/2021   10:16 AM  BP/Weight  Systolic BP 93 105 102  Diastolic BP 67 74 68  Wt. (Lbs) 211 184 184.2  BMI 33.05 kg/m2 28.82 kg/m2 28.85 kg/m2    Physical Exam Vitals and nursing note reviewed.  Constitutional:      General: She is not in acute distress.    Appearance: Normal appearance.  HENT:     Head: Normocephalic and atraumatic.  Cardiovascular:     Rate and Rhythm: Normal rate and regular rhythm.  Pulmonary:     Effort: Pulmonary effort is normal.     Breath sounds: Normal breath sounds.  Musculoskeletal:     Comments: Decreased range of motion of the lumbar spine.  Neurological:     Mental Status: She is alert.  Psychiatric:  Mood and Affect: Mood normal.        Behavior: Behavior normal.     Lab Results  Component Value Date   WBC 6.7 07/01/2020   HGB 14.2 07/01/2020   HCT 42.5 07/01/2020   PLT 210 07/01/2020   GLUCOSE 96 07/01/2020   CHOL 190 07/01/2020   TRIG 56 07/01/2020   HDL 48 07/01/2020   LDLCALC 131 (H) 07/01/2020   ALT 13 07/01/2020   AST 13 07/01/2020   NA 137 07/01/2020   K 4.2 07/01/2020   CL 102 07/01/2020   CREATININE 0.83 07/01/2020   BUN 10 07/01/2020   CO2 20 07/01/2020   TSH 1.820 07/01/2020   INR 2.45 (H) 08/28/2011     Assessment & Plan:   Problem List Items Addressed This Visit       Genitourinary   Acute cystitis with hematuria   History and UA consistent with UTI.  Sending culture.  Placing on Macrobid .      Relevant Orders   POCT URINALYSIS DIP (CLINITEK) (Completed)   Urine Culture     Other   Acute exacerbation of chronic low  back pain - Primary   Exacerbation of chronic low back pain.  Treating with methocarbamol , prednisone , and short supply of hydrocodone .  [place] on controlled substance database reviewed.      Relevant Medications   methocarbamol  (ROBAXIN ) 750 MG tablet   predniSONE  (DELTASONE ) 10 MG tablet   HYDROcodone -acetaminophen  (NORCO/VICODIN) 5-325 MG tablet    Meds ordered this encounter  Medications   nitrofurantoin , macrocrystal-monohydrate, (MACROBID ) 100 MG capsule    Sig: Take 1 capsule (100 mg total) by mouth 2 (two) times daily.    Dispense:  14 capsule    Refill:  0   methocarbamol  (ROBAXIN ) 750 MG tablet    Sig: Take 1 tablet (750 mg total) by mouth every 6 (six) hours as needed for muscle spasms.    Dispense:  120 tablet    Refill:  5   predniSONE  (DELTASONE ) 10 MG tablet    Sig: 50 mg daily x 2 days, then 40 mg daily x 2 days, then 30 mg daily x 2 days, then 20 mg daily x 2 days, then 10 mg daily x 2 days.    Dispense:  30 tablet    Refill:  0   HYDROcodone -acetaminophen  (NORCO/VICODIN) 5-325 MG tablet    Sig: Take 1 tablet by mouth every 8 (eight) hours as needed for moderate pain (pain score 4-6) or severe pain (pain score 7-10).    Dispense:  15 tablet    Refill:  0    Follow-up:  3-6 months  Hadlie Gipson Debrah Fan DO [facility]

## 2023-08-13 NOTE — Assessment & Plan Note (Signed)
History and UA consistent with UTI.  Sending culture.  Placing on Macrobid. ?

## 2023-08-13 NOTE — Assessment & Plan Note (Signed)
 Exacerbation of chronic low back pain.  Treating with methocarbamol , prednisone , and short supply of hydrocodone .  [place] on controlled substance database reviewed.

## 2023-08-15 LAB — URINE CULTURE

## 2023-08-15 LAB — SPECIMEN STATUS REPORT

## 2023-08-21 NOTE — Progress Notes (Signed)
 Patient notified of LDCT Lung Cancer Screening Results via mail with the recommendation to follow-up in 12 months. Patient's referring provider has been sent a copy of results. Results are as follows:  IMPRESSION: 1. Lung-RADS 2, benign appearance or behavior. Continue annual screening with low-dose chest CT without contrast in 12 months. 2. Age advanced left anterior descending coronary artery calcification. 3.  Emphysema (ICD10-J43.9).

## 2023-12-05 LAB — HM MAMMOGRAPHY

## 2023-12-10 ENCOUNTER — Ambulatory Visit (INDEPENDENT_AMBULATORY_CARE_PROVIDER_SITE_OTHER): Admitting: Nurse Practitioner

## 2023-12-10 ENCOUNTER — Ambulatory Visit: Payer: Self-pay

## 2023-12-10 VITALS — BP 119/75 | HR 87 | Temp 98.6°F | Ht 67.0 in | Wt 197.0 lb

## 2023-12-10 DIAGNOSIS — N939 Abnormal uterine and vaginal bleeding, unspecified: Secondary | ICD-10-CM | POA: Diagnosis not present

## 2023-12-10 DIAGNOSIS — Z113 Encounter for screening for infections with a predominantly sexual mode of transmission: Secondary | ICD-10-CM

## 2023-12-10 DIAGNOSIS — R151 Fecal smearing: Secondary | ICD-10-CM | POA: Diagnosis not present

## 2023-12-10 DIAGNOSIS — Z124 Encounter for screening for malignant neoplasm of cervix: Secondary | ICD-10-CM

## 2023-12-10 DIAGNOSIS — Z1151 Encounter for screening for human papillomavirus (HPV): Secondary | ICD-10-CM

## 2023-12-10 DIAGNOSIS — Z01419 Encounter for gynecological examination (general) (routine) without abnormal findings: Secondary | ICD-10-CM

## 2023-12-10 DIAGNOSIS — Z01411 Encounter for gynecological examination (general) (routine) with abnormal findings: Secondary | ICD-10-CM | POA: Diagnosis not present

## 2023-12-10 NOTE — Telephone Encounter (Signed)
   FYI Only or Action Required?: FYI only for provider  Patient was last seen in primary care on 08/13/2023 by Cook, Jayce G, DO. Called Nurse Triage reporting No chief complaint on file.. Symptoms began several days ago. Interventions attempted: Nothing. Symptoms are: unchanged.  Triage Disposition: No disposition on file.  Patient/caregiver understands and will follow disposition?:    Copied From CRM [id number]. Reason for Triage:PT is having back pain going into leg             Reason for Disposition  [1] MODERATE pain (e.g., interferes with normal activities, limping) AND [2] present > 3 days  Answer Assessment - Initial Assessment Questions 1. ONSET: "When did the pain start?"      Over the weekend 2. LOCATION: "Where is the pain located?"      Both legs 3. PAIN: "How bad is the pain?"    (Scale 1-10; or mild, moderate, severe)   -  MILD (1-3): doesn't interfere with normal activities    -  MODERATE (4-7): interferes with normal activities (e.g., work or school) or awakens from sleep, limping    -  SEVERE (8-10): excruciating pain, unable to do any normal activities, unable to walk     Like an electrical hit, severe 4. WORK OR EXERCISE: "Has there been any recent work or exercise that involved this part of the body?"      na 5. CAUSE: "What do you think is causing the leg pain?"     L4 through s1 fused 6. OTHER SYMPTOMS: "Do you have any other symptoms?" (e.g., chest pain, back pain, breathing difficulty, swelling, rash, fever, numbness, weakness)     Back pain 7. PREGNANCY: "Is there any chance you are pregnant?" "When was your last menstrual period?"     na  Protocols used: Leg Pain-A-AH

## 2023-12-10 NOTE — Progress Notes (Unsigned)
 Subjective:    Patient ID: Christina Blake, female    DOB: [birth year], 53 y.o.   MRN: [id number]  HPI The patient comes in today for a wellness visit.    A review of their health history was completed.  A review of medications was also completed.  Any needed refills; No  Eating habits: Good, but would like to eat less  Falls/  MVA accidents in past few months: No  Regular exercise: No; travels for her job; some activity, mainly walking  Specialist pt sees on regular basis: Psych  Preventative health issues were discussed.   Additional concerns: Go over lab work  Pap Smear  Discuss bowel leakage. Has been off Micronor  for 4-5 months. Now having heavy cycles with clotting. Same female sexual partner. No symptoms but agrees to add STD testing to PAP. Defers HIV and RPR testing.  Regular vision and dental exams.  CT for lung cancer screening and mammogram up to date. Has had slight bowel leakage for months. Sometimes occurs with flatus. Has soft, formed BMs, occasional diarrhea. Had normal colonoscopy in June 2022.  States she will notice when wiping after urination that she has smears of BM.   Review of Systems  Constitutional:  Positive for fatigue. Negative for activity change and appetite change.  HENT:  Negative for sore throat and trouble swallowing.   Respiratory:  Negative for cough, chest tightness, shortness of breath and wheezing.   Cardiovascular:  Negative for chest pain.  Gastrointestinal:  Positive for diarrhea. Negative for abdominal distention, abdominal pain, blood in stool, constipation, nausea and vomiting.  Genitourinary:  Positive for menstrual problem. Negative for difficulty urinating, dysuria, frequency, genital sores, pelvic pain, urgency and vaginal discharge.   Social History   Tobacco Use   Smoking status: Every Day    Current packs/day: 2.00    Average packs/day: 2.0 packs/day for 20.0 years (40.0 ttl pk-yrs)    Types: Cigarettes    Smokeless tobacco: Never  Vaping Use   Vaping status: Never Used  Substance Use Topics   Alcohol use: No    Alcohol/week: 0.0 standard drinks of alcohol   Drug use: No      12/10/2023   11:09 AM  Depression screen PHQ 2/9  Decreased Interest 0  Down, Depressed, Hopeless 3  PHQ - 2 Score 3  Altered sleeping 3  Tired, decreased energy 3  Change in appetite 0  Feeling bad or failure about yourself  0  Trouble concentrating 0  Moving slowly or fidgety/restless 0  Suicidal thoughts 0  PHQ-9 Score 9  Difficult doing work/chores Somewhat difficult      12/10/2023   11:09 AM 08/13/2023    9:13 AM 06/18/2020    9:54 AM  GAD 7 : Generalized Anxiety Score  Nervous, Anxious, on Edge 3 1 1   Control/stop worrying 2 1 1   Worry too much - different things 2 1 1   Trouble relaxing 3 2 2   Restless 3 1 2   Easily annoyed or irritable 3 1 2   Afraid - awful might happen 2 1 0  Total GAD 7 Score 18 8 9   Anxiety Difficulty Somewhat difficult Somewhat difficult Somewhat difficult    Notes that she is followed by psychiatry for her mental health issues.     Objective:   Physical Exam Vitals and nursing note reviewed. Chaperone present: Defers chaperone..  Constitutional:      General: She is not in acute distress.    Appearance: She is well-developed.  Neck:     Thyroid : No thyromegaly.     Trachea: No tracheal deviation.     Comments: Thyroid  non tender to palpation. No mass or goiter noted.  Cardiovascular:     Rate and Rhythm: Normal rate and regular rhythm.     Heart sounds: Normal heart sounds. No murmur heard. Pulmonary:     Effort: Pulmonary effort is normal.     Breath sounds: Normal breath sounds.  Chest:  Breasts:    Right: No swelling, inverted nipple, mass, skin change or tenderness.     Left: No swelling, inverted nipple, mass, skin change or tenderness.  Abdominal:     General: There is no distension.     Palpations: Abdomen is soft.     Tenderness: There is no  abdominal tenderness.  Genitourinary:    Comments: External GU mild irritation noted around the inner labia.  Vagina large amount of white mucoid discharge.  No erythema lesions or bleeding.  Cervix normal limit in appearance.  No CMT.  Bimanual exam no tenderness or obvious masses. Musculoskeletal:     Cervical back: Normal range of motion and neck supple.  Lymphadenopathy:     Cervical: No cervical adenopathy.     Upper Body:     Right upper body: No supraclavicular, axillary or pectoral adenopathy.     Left upper body: No supraclavicular, axillary or pectoral adenopathy.  Skin:    General: Skin is warm and dry.  Neurological:     Mental Status: She is alert and oriented to person, place, and time.  Psychiatric:        Mood and Affect: Mood normal.        Behavior: Behavior normal.        Thought Content: Thought content normal.        Judgment: Judgment normal.    Today's Vitals   12/10/23 1052  BP: 119/75  Pulse: 87  Temp: 98.6 F (37 C)  SpO2: 96%  Weight: 197 lb (89.4 kg)  Height: 5\' 7"  (1.702 m)   Body mass index is 30.85 kg/m.  Reviewed recent labs with patient from 07/06/2023.       Assessment & Plan:   Problem List Items Addressed This Visit       Genitourinary   Abnormal uterine bleeding   Relevant Orders   Ambulatory referral to Gynecology     Other   Fecal smearing   Other Visit Diagnoses       Well woman exam    -  Primary     Screening for HPV (human papillomavirus)       Relevant Orders   IGP,CtNgTv,Apt HPV     Screening for cervical cancer       Relevant Orders   IGP,CtNgTv,Apt HPV     Screen for STD (sexually transmitted disease)       Relevant Orders   IGP,CtNgTv,Apt HPV      Discussed options regarding abnormal uterine bleeding/perimenopause.  Patient agrees to go to GYN to discuss IUD insertion. Pap smear results pending. Recommend patient schedule a follow-up appointment with her gastroenterologist to discuss bowel  leakage. Discussed reducing the amount of cigarette smoking, the goal would be less than a pack per day eventually half a pack or less. Recommend increasing activity such as walking by counting her number of steps per day.  Discussed healthy diet. Continue follow-up with mental health provider. Has had her first shingles vaccine. Recommend updated tetanus vaccine at local pharmacy. Return in about  1 year (around 12/09/2024) for physical.

## 2023-12-10 NOTE — Telephone Encounter (Signed)
 noted

## 2023-12-11 ENCOUNTER — Encounter: Payer: Self-pay | Admitting: Nurse Practitioner

## 2023-12-11 ENCOUNTER — Encounter: Payer: Self-pay | Admitting: Family Medicine

## 2023-12-11 ENCOUNTER — Ambulatory Visit: Payer: Self-pay | Admitting: Family Medicine

## 2023-12-11 ENCOUNTER — Ambulatory Visit: Admitting: Family Medicine

## 2023-12-11 ENCOUNTER — Ambulatory Visit (HOSPITAL_COMMUNITY)
Admission: RE | Admit: 2023-12-11 | Discharge: 2023-12-11 | Disposition: A | Discharge status: Home / Self Care | Source: Ambulatory Visit | Attending: Family Medicine | Admitting: Family Medicine

## 2023-12-11 VITALS — BP 118/82 | HR 82 | Wt 194.0 lb

## 2023-12-11 DIAGNOSIS — G8929 Other chronic pain: Secondary | ICD-10-CM | POA: Insufficient documentation

## 2023-12-11 DIAGNOSIS — M545 Low back pain, unspecified: Secondary | ICD-10-CM

## 2023-12-11 DIAGNOSIS — N939 Abnormal uterine and vaginal bleeding, unspecified: Secondary | ICD-10-CM | POA: Insufficient documentation

## 2023-12-11 DIAGNOSIS — R151 Fecal smearing: Secondary | ICD-10-CM | POA: Insufficient documentation

## 2023-12-11 MED ORDER — PREDNISONE 10 MG PO TABS: ORAL_TABLET | ORAL | 0 refills | Status: DC

## 2023-12-11 NOTE — Assessment & Plan Note (Signed)
 X-ray for further evaluation.  Prednisone  as directed.

## 2023-12-11 NOTE — Patient Instructions (Signed)
 Xray at [facility].  We will call with results.  Medication as prescribed.

## 2023-12-11 NOTE — Progress Notes (Signed)
 Subjective:  Patient ID: Christina Blake, female    DOB: Aug 09, 1970  Age: 53 y.o. MRN: [id number]  CC:   Chief Complaint  Patient presents with   Follow-up    Patient in room #1 and alone.Patient states she here to follow up on her pain back that has gotten worse.    HPI:  53 year old female presents for evaluation of the above.  Patient has a history of chronic back pain.  Has had prior lumbar surgery.  Patient reports that she has recently been experiencing worsening low back pain.  Bilateral.  Now having radicular symptoms.  She has taken Robaxin  and used ice and heat without resolution.  Exacerbated by activity, particularly bending and squatting.  Patient Active Problem List   Diagnosis Date Noted   Acute exacerbation of chronic low back pain 12/11/2023   Hyperlipidemia 08/02/2021   Preventative health care 08/02/2021   History of IBS 07/29/2020   Menorrhagia with regular cycle 06/20/2020   Cervical neuralgia 07/28/2014    Social Hx   Social History   Socioeconomic History   Marital status: Married    Spouse name: Not on file   Number of children: Not on file   Years of education: Not on file   Highest education level: Not on file  Occupational History   Not on file  Tobacco Use   Smoking status: Every Day    Current packs/day: 2.00    Average packs/day: 2.0 packs/day for 20.0 years (40.0 ttl pk-yrs)    Types: Cigarettes   Smokeless tobacco: Never  Vaping Use   Vaping status: Never Used  Substance and Sexual Activity   Alcohol use: No    Alcohol/week: 0.0 standard drinks of alcohol   Drug use: No   Sexual activity: Yes    Birth control/protection: None  Other Topics Concern   Not on file  Social History Narrative   Not on file   Social Drivers of Health   Financial Resource Strain: Not on file  Food Insecurity: Not on file  Transportation Needs: Not on file  Physical Activity: Not on file  Stress: Not on file  Social Connections: Not on file     Review of Systems Per HPI  Objective:  BP 118/82 (BP Location: Right Blake, Patient Position: Sitting, Cuff Size: Normal)   Pulse 82   Wt 194 lb (88 kg)   SpO2 98%   BMI 30.38 kg/m      12/11/2023   10:29 AM 12/10/2023   10:52 AM 08/13/2023    8:59 AM  BP/Weight  Systolic BP 118 119 93  Diastolic BP 82 75 67  Wt. (Lbs) 194 197 211  BMI 30.38 kg/m2 30.85 kg/m2 33.05 kg/m2    Physical Exam Vitals and nursing note reviewed.  Constitutional:      General: She is not in acute distress.    Appearance: Normal appearance.  HENT:     Head: Normocephalic and atraumatic.  Cardiovascular:     Rate and Rhythm: Normal rate and regular rhythm.  Pulmonary:     Effort: Pulmonary effort is normal.     Breath sounds: Normal breath sounds.  Neurological:     Mental Status: She is alert.  Psychiatric:        Mood and Affect: Mood normal.        Behavior: Behavior normal.     Lab Results  Component Value Date   WBC 6.7 07/01/2020   HGB 14.2 07/01/2020   HCT  42.5 07/01/2020   PLT 210 07/01/2020   GLUCOSE 96 07/01/2020   CHOL 190 07/01/2020   TRIG 56 07/01/2020   HDL 48 07/01/2020   LDLCALC 131 (H) 07/01/2020   ALT 13 07/01/2020   AST 13 07/01/2020   NA 137 07/01/2020   K 4.2 07/01/2020   CL 102 07/01/2020   CREATININE 0.83 07/01/2020   BUN 10 07/01/2020   CO2 20 07/01/2020   TSH 1.820 07/01/2020   INR 2.45 (H) 08/28/2011     Assessment & Plan:  Acute exacerbation of chronic low back pain Assessment & Plan: X-ray for further evaluation.  Prednisone  as directed.  Orders: -     DG Lumbar Spine Complete -     predniSONE ; 50 mg daily x 2 days, then 40 mg daily x 2 days, then 30 mg daily x 2 days, then 20 mg daily x 2 days, then 10 mg daily x 2 days.  Dispense: 30 tablet; Refill: 0    Follow-up: Pending x-ray  Kathleen Papa DO [facility]

## 2023-12-13 LAB — IGP,CTNGTV,APT HPV
Chlamydia, Nuc. Acid Amp: NEGATIVE
Gonococcus, Nuc. Acid Amp: NEGATIVE
HPV Aptima: NEGATIVE
Trich vag by NAA: NEGATIVE

## 2023-12-17 ENCOUNTER — Encounter: Payer: Self-pay | Admitting: Family Medicine

## 2023-12-19 ENCOUNTER — Ambulatory Visit: Payer: Self-pay | Admitting: Nurse Practitioner

## 2023-12-20 ENCOUNTER — Ambulatory Visit: Admitting: Obstetrics & Gynecology

## 2023-12-20 ENCOUNTER — Encounter: Payer: Self-pay | Admitting: Obstetrics & Gynecology

## 2023-12-20 VITALS — BP 122/79 | Ht 67.0 in | Wt 203.0 lb

## 2023-12-20 DIAGNOSIS — N924 Excessive bleeding in the premenopausal period: Secondary | ICD-10-CM | POA: Diagnosis not present

## 2023-12-20 NOTE — Progress Notes (Signed)
 Chief Complaint  Patient presents with   abnormal uterine bleeding      53 y.o. [id number] No LMP recorded. The current method of family planning is endometrial ablation.  Outpatient Encounter Medications as of 12/20/2023  Medication Sig   ALPRAZolam  (XANAX ) 0.5 MG tablet Take 0.5 mg by mouth 4 (four) times daily as needed for anxiety.   aspirin EC 81 MG tablet Take 81 mg by mouth daily. Swallow whole.   lamoTRIgine  (LAMICTAL ) 200 MG tablet Take 200 mg by mouth at bedtime.   methocarbamol  (ROBAXIN ) 750 MG tablet Take 1 tablet (750 mg total) by mouth every 6 (six) hours as needed for muscle spasms.   PARoxetine  (PAXIL -CR) 37.5 MG 24 hr tablet Take 20 mg by mouth daily.   predniSONE  (DELTASONE ) 10 MG tablet 50 mg daily x 2 days, then 40 mg daily x 2 days, then 30 mg daily x 2 days, then 20 mg daily x 2 days, then 10 mg daily x 2 days.   temazepam  (RESTORIL ) 30 MG capsule Take 45 mg by mouth at bedtime.   traZODone  (DESYREL ) 150 MG tablet Take 300 mg by mouth at bedtime.   No facility-administered encounter medications on file as of 12/20/2023.    Subjective Pt has been having ^bleeding the past 3-4 years 5-6 days heavy clots with cramping Ablation 2013 Thought she would stop at some point Irregualr q8 weeks sometimes 3w Past Medical History:  Diagnosis Date   Anxiety    Cervical neuralgia    Chronic back pain    Depression    History of agoraphobia 2007   Hyperlipidemia    Orthostatic hypotension    Pulmonary embolism, bilateral (HCC) 2012   Trochanteric bursitis of both hips     Past Surgical History:  Procedure Laterality Date   ANTERIOR CERVICAL DECOMP/DISCECTOMY FUSION N/A 09/04/2014   Procedure: Cervical four-five Cervical six-seven Anterior cervical decompression/diskectomy,fusion with exploration of Cervical five-six Fusion (Removal of Helix plate);  Surgeon: Manya Sells, MD;  Location: [facility];  Service: Neurosurgery;  Laterality: N/A;   BACK SURGERY      C5 & C6 fusion  07/25/12   COLONOSCOPY WITH PROPOFOL  N/A 12/15/2020   Procedure: COLONOSCOPY WITH PROPOFOL ;  Surgeon: Ruby Corporal, MD;  Location: [facility];  Service: Endoscopy;  Laterality: N/A;  7:30   HEMOSTASIS CLIP PLACEMENT  12/15/2020   Procedure: HEMOSTASIS CLIP PLACEMENT;  Surgeon: Ruby Corporal, MD;  Location: [facility];  Service: Endoscopy;;   HOT HEMOSTASIS  12/15/2020   Procedure: HOT HEMOSTASIS (ARGON PLASMA COAGULATION/BICAP);  Surgeon: Ruby Corporal, MD;  Location: [facility];  Service: Endoscopy;;   L4,L5 & S1 fusion  07/23/12   OSTEOTOMY     Left foot 2nd toe-[facility]   POLYPECTOMY  12/15/2020   Procedure: POLYPECTOMY;  Surgeon: Ruby Corporal, MD;  Location: [facility];  Service: Endoscopy;;   Uterine ablation      OB History     Gravida  2   Para  2   Term  2   Preterm      AB      Living  2      SAB      IAB      Ectopic      Multiple      Live Births              No Known Allergies  Social History   Socioeconomic History  Marital status: Married    Spouse name: Not on file   Number of children: Not on file   Years of education: Not on file   Highest education level: Not on file  Occupational History   Not on file  Tobacco Use   Smoking status: Every Day    Current packs/day: 2.00    Average packs/day: 2.0 packs/day for 20.0 years (40.0 ttl pk-yrs)    Types: Cigarettes   Smokeless tobacco: Never  Vaping Use   Vaping status: Never Used  Substance and Sexual Activity   Alcohol use: No    Alcohol/week: 0.0 standard drinks of alcohol   Drug use: No   Sexual activity: Yes    Birth control/protection: None  Other Topics Concern   Not on file  Social History Narrative   Not on file   Social Drivers of Health   Financial Resource Strain: Low Risk  (12/20/2023)   Overall Financial Resource Strain (CARDIA)    Difficulty of Paying Living Expenses: Not hard at all  Food Insecurity: No Food Insecurity  (12/20/2023)   Hunger Vital Sign    Worried About Running Out of Food in the Last Year: Never true    Ran Out of Food in the Last Year: Never true  Transportation Needs: No Transportation Needs (12/20/2023)   PRAPARE - Administrator, Civil Service (Medical): No    Lack of Transportation (Non-Medical): No  Physical Activity: Inactive (12/20/2023)   Exercise Vital Sign    Days of Exercise per Week: 0 days    Minutes of Exercise per Session: 0 min  Stress: No Stress Concern Present (12/20/2023)   [employer] of Occupational Health - Occupational Stress Questionnaire    Feeling of Stress: Only a little  Social Connections: Moderately Integrated (12/20/2023)   Social Connection and Isolation Panel    Frequency of Communication with Friends and Family: More than three times a week    Frequency of Social Gatherings with Friends and Family: Twice a week    Attends Religious Services: More than 4 times per year    Active Member of [employer] or Organizations: Yes    Attends Engineer, structural: More than 4 times per year    Marital Status: Divorced    Family History  Problem Relation Age of Onset   Hypertension Mother    Heart disease Mother 28       MI at 54    Osteoporosis Mother    Breast cancer Maternal Aunt    Breast cancer Cousin        Maternal first cousin   Anesthesia problems Neg Hx    Hypotension Neg Hx    Malignant hyperthermia Neg Hx    Pseudochol deficiency Neg Hx     Medications:       Current Outpatient Medications:    ALPRAZolam  (XANAX ) 0.5 MG tablet, Take 0.5 mg by mouth 4 (four) times daily as needed for anxiety., Disp: , Rfl:    aspirin EC 81 MG tablet, Take 81 mg by mouth daily. Swallow whole., Disp: , Rfl:    lamoTRIgine  (LAMICTAL ) 200 MG tablet, Take 200 mg by mouth at bedtime., Disp: , Rfl:    methocarbamol  (ROBAXIN ) 750 MG tablet, Take 1 tablet (750 mg total) by mouth every 6 (six) hours as needed for muscle spasms., Disp: 120 tablet,  Rfl: 5   PARoxetine  (PAXIL -CR) 37.5 MG 24 hr tablet, Take 20 mg by mouth daily., Disp: , Rfl:  predniSONE  (DELTASONE ) 10 MG tablet, 50 mg daily x 2 days, then 40 mg daily x 2 days, then 30 mg daily x 2 days, then 20 mg daily x 2 days, then 10 mg daily x 2 days., Disp: 30 tablet, Rfl: 0   temazepam  (RESTORIL ) 30 MG capsule, Take 45 mg by mouth at bedtime., Disp: , Rfl:    traZODone  (DESYREL ) 150 MG tablet, Take 300 mg by mouth at bedtime., Disp: , Rfl:   Objective Blood pressure 122/79, height 5' 7 (1.702 m), weight 203 lb (92.1 kg).  General WDWN female NAD Vulva:  normal appearing vulva with no masses, tenderness or lesions Vagina:  normal mucosa, no discharge Cervix:  Normal no lesions Uterus:  normal size, contour, position, consistency, mobility, non-tender Adnexa: ovaries:present,  normal adnexa in size, nontender and no masses   Pertinent ROS No burning with urination, frequency or urgency No nausea, vomiting or diarrhea Nor fever chills or other constitutional symptoms   Labs or studies ordered    Impression + Management Plan: Diagnoses this Encounter::   ICD-10-CM   1. Abnormal perimenopausal bleeding  N92.4 FSH    US  PELVIS (TRANSABDOMINAL ONLY)    US  PELVIS TRANSVAGINAL NON-OB (TV ONLY)    US  PELVIS TRANSVAGINAL NON-OB (TV ONLY)    US  PELVIS (TRANSABDOMINAL ONLY)   Check FSH and sonogram, if appears not menopausal and no other pathology, pt would like Mirena IUD and I agree, await results        Medications prescribed during  this encounter: No orders of the defined types were placed in this encounter.   Labs or Scans Ordered during this encounter: Orders Placed This Encounter  Procedures   US  PELVIS (TRANSABDOMINAL ONLY)   US  PELVIS TRANSVAGINAL NON-OB (TV ONLY)   FSH      Follow up Return if symptoms worsen or fail to improve, for MyChart Connect visit.

## 2023-12-21 ENCOUNTER — Encounter: Payer: Self-pay | Admitting: Obstetrics & Gynecology

## 2023-12-21 ENCOUNTER — Ambulatory Visit (HOSPITAL_COMMUNITY)
Admission: RE | Admit: 2023-12-21 | Discharge: 2023-12-21 | Disposition: A | Discharge status: Home / Self Care | Source: Ambulatory Visit | Attending: Obstetrics & Gynecology | Admitting: Obstetrics & Gynecology

## 2023-12-21 DIAGNOSIS — N924 Excessive bleeding in the premenopausal period: Secondary | ICD-10-CM | POA: Diagnosis present

## 2023-12-21 LAB — FOLLICLE STIMULATING HORMONE: FSH: 22.1 m[IU]/mL

## 2023-12-24 ENCOUNTER — Other Ambulatory Visit: Payer: Self-pay

## 2023-12-24 DIAGNOSIS — G8929 Other chronic pain: Secondary | ICD-10-CM

## 2023-12-28 ENCOUNTER — Encounter: Payer: Self-pay | Admitting: Obstetrics & Gynecology

## 2023-12-31 ENCOUNTER — Ambulatory Visit: Payer: Self-pay | Admitting: Obstetrics & Gynecology

## 2024-01-02 ENCOUNTER — Ambulatory Visit: Payer: Self-pay

## 2024-01-02 ENCOUNTER — Encounter: Payer: Self-pay | Admitting: Family Medicine

## 2024-01-02 ENCOUNTER — Ambulatory Visit: Admitting: Family Medicine

## 2024-01-02 VITALS — BP 118/79 | HR 77 | Temp 98.4°F | Ht 67.0 in | Wt 198.0 lb

## 2024-01-02 DIAGNOSIS — M5440 Lumbago with sciatica, unspecified side: Secondary | ICD-10-CM

## 2024-01-02 MED ORDER — PREGABALIN 25 MG PO CAPS: 25.0000 mg | ORAL_CAPSULE | Freq: Two times a day (BID) | ORAL | 3 refills | Status: DC

## 2024-01-02 MED ORDER — HYDROCODONE-ACETAMINOPHEN 5-325 MG PO TABS: 1.0000 | ORAL_TABLET | Freq: Four times a day (QID) | ORAL | 0 refills | Status: AC | PRN

## 2024-01-02 NOTE — Telephone Encounter (Signed)
 FYI Only or Action Required?: FYI only for provider.  Patient was last seen in primary care on 12/11/2023 by Cook, Jayce G, DO. Called Nurse Triage reporting Back Pain. Symptoms began several days ago. Interventions attempted: Rest, hydration, or home remedies and Ice/heat application. Symptoms are: gradually worsening.  Triage Disposition: See HCP Within 4 Hours (Or PCP Triage)  Patient/caregiver understands and will follow disposition?: Yes  Copied from CRM [id number]. Topic: Clinical - Red Word Triage >> Jan 02, 2024  9:42 AM Marissa P wrote: Red Word that prompted transfer to Nurse Triage: Having terrible back pain, chronic and gotten worse the last couple of days. Lower back and both hips, would like to be seen right away. Reason for Disposition  [1] SEVERE back pain (e.g., excruciating, unable to do any normal activities) AND [2] not improved 2 hours after pain medicine  Answer Assessment - Initial Assessment Questions 1. ONSET: When did the pain begin?      Chronic pain but worsened over the last four days.  2. LOCATION: Where does it hurt? (upper, mid or lower back)     Lower back 3. SEVERITY: How bad is the pain?  (e.g., Scale 1-10; mild, moderate, or severe)   - MILD (1-3): Doesn't interfere with normal activities.    - MODERATE (4-7): Interferes with normal activities or awakens from sleep.    - SEVERE (8-10): Excruciating pain, unable to do any normal activities.      8 out of 10 4. PATTERN: Is the pain constant? (e.g., yes, no; constant, intermittent)      constant 5. RADIATION: Does the pain shoot into your legs or somewhere else?     Radiates into hips 6. CAUSE:  What do you think is causing the back pain?      unsure 7. BACK OVERUSE:  Any recent lifting of heavy objects, strenuous work or exercise?     no 8. MEDICINES: What have you taken so far for the pain? (e.g., nothing, acetaminophen , NSAIDS)     Muscle relaxer  9. NEUROLOGIC SYMPTOMS: Do you have  any weakness, numbness, or problems with bowel/bladder control?     no 10. OTHER SYMPTOMS: Do you have any other symptoms? (e.g., fever, abdomen pain, burning with urination, blood in urine)       No  Patient requesting to be seen today. Reports limited improvement with steroid and muscle relaxer. Patient requested for an earlier appointment then what provider had available. Patient scheduled for 11:20 AM today with another provider in office.  Protocols used: Back Pain-A-AH

## 2024-01-02 NOTE — Addendum Note (Signed)
 Addended by: Chelbie Jarnagin R on: 01/02/2024 03:00 PM   Modules accepted: Orders

## 2024-01-02 NOTE — Progress Notes (Signed)
 Subjective:    Patient ID: Christina Blake, female    DOB: Oct 10, 1970, 53 y.o.   MRN: [id number]  HPI Chronic low back pain with right hip pain  Chronic and worse in the past couple of weeks Taken tyl/ ibu otc Patient has tried stretches She has tried conservative measures including OTC medications, short course of prednisone  as well, has done x-rays, she will has had this for years but has been seen several times this year for this problem Discussed the use of AI scribe software for clinical note transcription with the patient, who gave verbal consent to proceed.  History of Present Illness   Christina Blake is a 53 year old female with chronic back pain who presents with worsening lower back pain radiating to the legs.  She experiences persistent lower back pain radiating to her hips and glutes, occasionally extending down the back of her legs to the knees. The pain is exacerbated by prolonged sitting, standing, walking, and certain physical activities such as bending. It is described as constant, with no periods of relief, and has been ongoing for years, with significant worsening over the past six months. She has been taking Robaxin , which provides minimal relief, and has tried over-the-counter medications such as Tylenol  and ibuprofen , as well as topical treatments like Biofreeze gel and lidocaine  patches. Prednisone , previously prescribed, helped when the pain was in her legs. She has had back surgery in the past. 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  In addition to her back issues, she experiences heavy menstrual bleeding and clots, with an ultrasound indicating a thickened uterus.  She also reports chronic depression and anxiety, for which she takes Lamictal . Despite taking medications at bedtime, she struggles with sleep due to pain, often waking up  early and needing to change positions frequently to alleviate discomfort.      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 X-rays show degenerative changes and foraminal narrowing, suggesting nerve impingement. Previous surgery complicates condition. Discussed MRI's potential benefits and limitations. Discussed pregabalin for neuropathic pain and physical therapy for core strengthening. Considered targeted injections if Lyrica is insufficient. - Order MRI of the lumbar spine. - Prescribe pregabalin (Lyrica) 25 mg twice daily, with gradual dose escalation. - Recommend physical therapy for core strengthening and stretching. - Advise lifestyle modifications to manage pain. - Consider referral to pain management or physical medicine specialist if needed.  Chronic Depression and Anxiety Chronic depression and anxiety managed with lamotrigine  and trazodone . Current regimen not providing adequate sleep relief. Discussed potential interaction of pregabalin with current medications, noting risk of increased drowsiness. - Continue current medications for depression and anxiety. - Monitor for increased drowsiness with pregabalin.  Menorrhagia with Thickened Endometrium Menorrhagia with ultrasound showing thickened endometrium. Perimenopausal with  irregular cycles. Discussed low likelihood of malignancy but recommended discussing  endometrial sampling during IUD placement when talking with GYN -Patient to  Discuss ultrasound findings and concerns with gynecologist.  Follow-up Emphasized potential delay in MRI results due to radiologist shortage. Explained MRI approval process and potential wait time. - Schedule MRI and follow up on approval process. - Contact central scheduling if not contacted by Monday or Tuesday. - Provide update on pregabalin effectiveness in 10-14 days. - Follow up if MRI results are delayed beyond three weeks.  Patient will do home exercises for her back she states she has pamphlets  Given that she is having chronic pain with her back well over the past several years but worsening over the past 6 months with sciatica uncontrolled with general measures it is reasonable to start Lyrica low-dose 25 mg twice daily and then every 10 to 14 days as tolerated gradually titrate upward There are no drug interactions with her current medicines with this  Short course of hydrocodone  was given to the patient after she requested to have some pain medicine I told her that it would be reasonable to use this in the short run but not in the long run because of her temazepam  and Xanax  not to take this with driving only to take this when at home during the day and for short course only  Depending what the MRI shows-it is possible she may need to see neurosurgery again It is also quite possible that this may be a multi level problem that is not amendable to surgery and in no situations a physical medicine specialist in [place] might offer some help or pain management center

## 2024-01-10 ENCOUNTER — Ambulatory Visit (HOSPITAL_COMMUNITY)
Admission: RE | Admit: 2024-01-10 | Discharge: 2024-01-10 | Disposition: A | Discharge status: Home / Self Care | Source: Ambulatory Visit | Attending: Family Medicine | Admitting: Family Medicine

## 2024-01-10 DIAGNOSIS — M5440 Lumbago with sciatica, unspecified side: Secondary | ICD-10-CM | POA: Insufficient documentation

## 2024-01-10 MED ORDER — GADOBUTROL 1 MMOL/ML IV SOLN
9.0000 mL | Freq: Once | INTRAVENOUS | Status: AC | PRN
  Administered 2024-01-10: 9 mL via INTRAVENOUS

## 2024-01-11 ENCOUNTER — Encounter: Payer: Self-pay | Admitting: Family Medicine

## 2024-01-11 ENCOUNTER — Ambulatory Visit: Payer: Self-pay | Admitting: Family Medicine

## 2024-01-11 ENCOUNTER — Other Ambulatory Visit: Payer: Self-pay | Admitting: Family Medicine

## 2024-01-14 ENCOUNTER — Other Ambulatory Visit: Payer: Self-pay

## 2024-01-14 DIAGNOSIS — M4800 Spinal stenosis, site unspecified: Secondary | ICD-10-CM

## 2024-01-14 MED ORDER — PREGABALIN 50 MG PO CAPS: 50.0000 mg | ORAL_CAPSULE | Freq: Two times a day (BID) | ORAL | 3 refills | Status: DC

## 2024-01-14 MED ORDER — PREGABALIN 50 MG PO CAPS
50.0000 mg | ORAL_CAPSULE | Freq: Two times a day (BID) | ORAL | 1 refills | Status: DC
Start: 2024-01-14 — End: 2024-01-14

## 2024-01-17 ENCOUNTER — Other Ambulatory Visit: Payer: Self-pay | Admitting: Family Medicine

## 2024-01-29 ENCOUNTER — Encounter: Payer: Self-pay | Admitting: Obstetrics & Gynecology

## 2024-01-29 ENCOUNTER — Ambulatory Visit: Admitting: Obstetrics & Gynecology

## 2024-01-29 VITALS — BP 110/74 | HR 78 | Ht 67.0 in | Wt 200.0 lb

## 2024-01-29 DIAGNOSIS — Z30430 Encounter for insertion of intrauterine contraceptive device: Secondary | ICD-10-CM

## 2024-01-29 DIAGNOSIS — Z538 Procedure and treatment not carried out for other reasons: Secondary | ICD-10-CM

## 2024-01-29 DIAGNOSIS — N924 Excessive bleeding in the premenopausal period: Secondary | ICD-10-CM

## 2024-01-29 MED ORDER — PROGESTERONE 200 MG PO CAPS
ORAL_CAPSULE | ORAL | 11 refills | Status: AC
Start: 2024-01-29 — End: ?

## 2024-01-29 NOTE — Progress Notes (Signed)
 Follow up appointment: Christina Blake placement  Chief Complaint  Patient presents with   Contraception    IUD insertion    Blood pressure 110/74, pulse 78, height 5' 7 (1.702 m), weight 200 lb (90.7 kg).  Attempted to place a [place] IUD Used paracerivcal block but her lower uterine segment is stenotic likely from her endometrial ablation 2013  Failed to place  Will begin monthly progesterone  and monitor her withdrawal pattern   MEDS ordered this encounter: Meds ordered this encounter  Medications   progesterone  (PROMETRIUM ) 200 MG capsule    Sig: Take 2 tablets nightly x 10 monthly    Dispense:  20 capsule    Refill:  11    Orders for this encounter: No orders of the defined types were placed in this encounter.   Impression + Management Plan   ICD-10-CM   1. Abnormal perimenopausal bleeding:  N92.4     2. Unsuccessful IUD insertion: cervical stenosis probably due to endometrial ablation 2013  Z53.8       Follow Up: Return in about 6 months (around 07/31/2024) for MyChart Connect visit.     All questions were answered.  Past Medical History:  Diagnosis Date   Anxiety    Cervical neuralgia    Chronic back pain    Depression    History of agoraphobia 2007   Hyperlipidemia    Orthostatic hypotension    Pulmonary embolism, bilateral (HCC) 2012   Trochanteric bursitis of both hips     Past Surgical History:  Procedure Laterality Date   ANTERIOR CERVICAL DECOMP/DISCECTOMY FUSION N/A 09/04/2014   Procedure: Cervical four-five Cervical six-seven Anterior cervical decompression/diskectomy,fusion with exploration of Cervical five-six Fusion (Removal of Helix plate);  Surgeon: Fairy Levels, MD;  Location: [facility];  Service: Neurosurgery;  Laterality: N/A;   BACK SURGERY     C5 & C6 fusion  07/25/12   COLONOSCOPY WITH PROPOFOL  N/A 12/15/2020   Procedure: COLONOSCOPY WITH PROPOFOL ;  Surgeon: Golda Claudis PENNER, MD;  Location: [facility];  Service: Endoscopy;   Laterality: N/A;  7:30   HEMOSTASIS CLIP PLACEMENT  12/15/2020   Procedure: HEMOSTASIS CLIP PLACEMENT;  Surgeon: Golda Claudis PENNER, MD;  [facility]: [facility];  Service: Endoscopy;;   HOT HEMOSTASIS  12/15/2020   Procedure: HOT HEMOSTASIS (ARGON PLASMA COAGULATION/BICAP);  Surgeon: Golda Claudis PENNER, MD;  [facility];  Service: Endoscopy;;   L4,L5 & S1 fusion  07/23/12   OSTEOTOMY     Left foot 2nd toe-[facility]   POLYPECTOMY  12/15/2020   Procedure: POLYPECTOMY;  Surgeon: Golda Claudis PENNER, MD;  [facility];  Service: Endoscopy;;   Uterine ablation      OB History     Gravida  2   Para  2   Term  2   Preterm      AB      Living  2      SAB      IAB      Ectopic      Multiple      Live Births              No Known Allergies  Social History   Socioeconomic History   Marital status: Married    Spouse name: Not on file   Number of children: Not on file   Years of education: Not on file   Highest education level: Not on file  Occupational History   Not on file  Tobacco Use  Smoking status: Every Day    Current packs/day: 2.00    Average packs/day: 2.0 packs/day for 20.0 years (40.0 ttl pk-yrs)    Types: Cigarettes   Smokeless tobacco: Never  Vaping Use   Vaping status: Never Used  Substance and Sexual Activity   Alcohol use: No    Alcohol/week: 0.0 standard drinks of alcohol   Drug use: No   Sexual activity: Yes    Birth control/protection: None  Other Topics Concern   Not on file  Social History Narrative   Not on file   Social Drivers of Health   Financial Resource Strain: Low Risk  (12/20/2023)   Overall Financial Resource Strain (CARDIA)    Difficulty of Paying Living Expenses: Not hard at all  Food Insecurity: No Food Insecurity (12/20/2023)   Hunger Vital Sign    Worried About Running Out of Food in the Last Year: Never true    Ran Out of Food in the Last Year: Never true  Transportation Needs: No Transportation Needs  (12/20/2023)   PRAPARE - Administrator, Civil Service (Medical): No    Lack of Transportation (Non-Medical): No  Physical Activity: Inactive (12/20/2023)   Exercise Vital Sign    Days of Exercise per Week: 0 days    Minutes of Exercise per Session: 0 min  Stress: No Stress Concern Present (12/20/2023)   [employer] of Occupational Health - Occupational Stress Questionnaire    Feeling of Stress: Only a little  Social Connections: Moderately Integrated (12/20/2023)   Social Connection and Isolation Panel    Frequency of Communication with Friends and Family: More than three times a week    Frequency of Social Gatherings with Friends and Family: Twice a week    Attends Religious Services: More than 4 times per year    Active Member of [employer] or Organizations: Yes    Attends Engineer, structural: More than 4 times per year    Marital Status: Divorced    Family History  Problem Relation Age of Onset   Hypertension Mother    Heart disease Mother 93       MI at 71    Osteoporosis Mother    Breast cancer Maternal Aunt    Breast cancer Cousin        Maternal first cousin   Anesthesia problems Neg Hx    Hypotension Neg Hx    Malignant hyperthermia Neg Hx    Pseudochol deficiency Neg Hx

## 2024-02-01 ENCOUNTER — Encounter: Payer: Self-pay | Admitting: Obstetrics & Gynecology

## 2024-02-04 ENCOUNTER — Encounter: Payer: Self-pay | Admitting: Physical Therapy

## 2024-02-04 ENCOUNTER — Ambulatory Visit: Attending: Neurosurgery | Admitting: Physical Therapy

## 2024-02-04 DIAGNOSIS — M6281 Muscle weakness (generalized): Secondary | ICD-10-CM | POA: Insufficient documentation

## 2024-02-04 DIAGNOSIS — M5459 Other low back pain: Secondary | ICD-10-CM | POA: Insufficient documentation

## 2024-02-04 DIAGNOSIS — R262 Difficulty in walking, not elsewhere classified: Secondary | ICD-10-CM | POA: Insufficient documentation

## 2024-02-04 NOTE — Therapy (Signed)
 OUTPATIENT PHYSICAL THERAPY EVALUATION   Patient Name: Christina Blake MRN: [id number] DOB:1970/10/03, 53 y.o., female Today's Date: 02/04/2024  END OF SESSION:  PT End of Session - 02/04/24 1155     Visit Number 1    Number of Visits 10    Date for PT Re-Evaluation 03/17/24    PT Start Time 1015    PT Stop Time 1100    PT Time Calculation (min) 45 min    Activity Tolerance Patient tolerated treatment well    Behavior During Therapy [facility] for tasks assessed/performed          Past Medical History:  Diagnosis Date   Anxiety    Cervical neuralgia    Chronic back pain    Depression    History of agoraphobia 2007   Hyperlipidemia    Orthostatic hypotension    Pulmonary embolism, bilateral (HCC) 2012   Trochanteric bursitis of both hips    Past Surgical History:  Procedure Laterality Date   ANTERIOR CERVICAL DECOMP/DISCECTOMY FUSION N/A 09/04/2014   Procedure: Cervical four-five Cervical six-seven Anterior cervical decompression/diskectomy,fusion with exploration of Cervical five-six Fusion (Removal of Helix plate);  Surgeon: Fairy Levels, MD;  Location: [facility];  Service: Neurosurgery;  Laterality: N/A;   BACK SURGERY     C5 & C6 fusion  07/25/12   COLONOSCOPY WITH PROPOFOL  N/A 12/15/2020   Procedure: COLONOSCOPY WITH PROPOFOL ;  Surgeon: Golda Claudis PENNER, MD;  [facility];  Service: Endoscopy;  Laterality: N/A;  7:30   HEMOSTASIS CLIP PLACEMENT  12/15/2020   Procedure: HEMOSTASIS CLIP PLACEMENT;  Surgeon: Golda Claudis PENNER, MD;  [facility];  Service: Endoscopy;;   HOT HEMOSTASIS  12/15/2020   Procedure: HOT HEMOSTASIS (ARGON PLASMA COAGULATION/BICAP);  Surgeon: Golda Claudis PENNER, MD;  [facility];  Service: Endoscopy;;   L4,L5 & S1 fusion  07/23/12   OSTEOTOMY     Left foot 2nd toe-[facility]   POLYPECTOMY  12/15/2020   Procedure: POLYPECTOMY;  Surgeon: Golda Claudis PENNER, MD;  [facility];  Service: Endoscopy;;   Uterine ablation      Patient Active Problem List   Diagnosis Date Noted   Acute exacerbation of chronic low back pain 12/11/2023   Hyperlipidemia 08/02/2021   Back pain of lumbar region with sciatica 08/02/2021   Preventative health care 08/02/2021   History of IBS 07/29/2020   Menorrhagia with regular cycle 06/20/2020   Cervical neuralgia 07/28/2014    PCP: Bluford Jacqulyn MATSU, DO   REFERRING PROVIDER: Onetha Kuba, MD  REFERRING DIAG:  Diagnosis  M54.40 (ICD-10-CM) - Lumbago with sciatica,    Rationale for Evaluation and Treatment:  Rehabiliation  THERAPY DIAG:  Other low back pain  Muscle weakness (generalized)  Difficulty in walking, not elsewhere classified  ONSET DATE: 2013   SUBJECTIVE:  SUBJECTIVE STATEMENT: She is having back pain and pain down back and sides of legs down to the calves. Previous back surgery in 2013 she reports  PERTINENT HISTORY:  See above PMH ,previous spinal fusion  PAIN:  NPRS scale: 8/10 upon arrival Pain location:low back and down legs Pain description: constant, catchy Aggravating factors: laying on back or sides, walking, getting in/out of car Relieving factors: resting, sitting, TENS,    PRECAUTIONS: ,  Fusion  RED FLAGS: None   WEIGHT BEARING RESTRICTIONS:  No  FALLS:  Has patient fallen in last 6 months? No   OCCUPATION:  Nurse, typing a lot  PLOF:  Independent  PATIENT GOALS:  Reduce pain  OBJECTIVE:  Note: Objective measures were completed at Evaluation unless otherwise noted.  DIAGNOSTIC FINDINGS:  IMPRESSION: 1. Prior PLIF with posterolateral fusion at L4-5 and L5-S1 with solid arthrodesis 2. Severe spinal stenosis at L3-4 due to central disc herniation and facet arthropathy 3. Moderate spinal stenosis at L2-3 due to severe degenerative  disc disease, disc bulge and mild facet arthropathy  PATIENT SURVEYS:  Patient-Specific Activity Scoring Scheme  0 represents "unable to perform." 10 represents "able to perform at prior level. 0 1 2 3 4 5 6 7 8 9  10 (Date and Score)   Activity Eval     1. Getting in/out car  5    2. Walking   6    3. sleeping 3   4.    5.    Score 4.66/10    Total score = sum of the activity scores/number of activities Minimum detectable change (90%CI) for average score = 2 points Minimum detectable change (90%CI) for single activity score = 3 points  GAIT: Assistive device utilized: None Level of assistance: Complete Independence Comments: pain limits her distance she can walk to less than 20 mintues    LUMBAR ROM:   Active  AROM  eval  Flexion 75%  Extension 50%  Right lateral flexion   Left lateral flexion   Right rotation 50%  Left rotation 50%   (Blank rows = not tested)   LOWER EXTREMITY MMT:    MMT in sitting Right eval Left eval  Hip flexion 3 3  Hip extension    Hip abduction 3 3  Hip adduction    Hip internal rotation    Hip external rotation    Knee flexion 4 4  Knee extension 4 4  Ankle dorsiflexion    Ankle plantarflexion    Ankle inversion    Ankle eversion     (Blank rows = not tested)    SPECIAL TESTS:  Lumbar Straight leg raise test: Positive and Slump test: Positive Some relief with extension movements Some relief with long axis distraction  Palpation: Pain and tenderness in lumbar spine and glute/piriformis with trigger points noted  TREATMENT DATE:  Eval HEP creation and review with demonstration and trial set preformed, see below for details Estim X 15 min to low back and piriformis bilat in supine with legs elevated, intensity turned up to tolerance Selfcare:Threacane for home trigger point release, leg elevator  for comfort, importance of stretching first thing in the morning.    PATIENT EDUCATION: Education details: HEP, PT plan of care, selfcare Person educated: Patient Education method: Explanation, Demonstration, Verbal cues, and Handouts Education comprehension: verbalized understanding, further education recommended   HOME EXERCISE PROGRAM: Access Code: URL: [email or web address] Date: 02/04/2024 Prepared by: Redell Moose  Exercises - supine IT band and piriformis stretch  - 2 x daily - 6 x weekly - 3 sets - 30 sec hold - Supine Bridge  - 2 x daily - 6 x weekly - 1 sets - 10 reps - 5 sec hold - Modified Thomas Stretch  - 2 x daily - 6 x weekly - 3 sets - 30 sec hold - Standing 'L' Stretch at [employer]  - 2 x daily - 6 x weekly - 1 sets - 10 reps - 5 sec hold - Standing Lumbar Extension  - 2 x daily - 6 x weekly - 1-2 sets - 10 reps - 3 sec hold - Standing Hip Extension with Counter Support  - 2 x daily - 6 x weekly - 1 sets - 10 reps - 3 sec hold - Slump Stretch  - 2 x daily - 6 x weekly - 1 sets - 10 reps - 3 sec hold  ASSESSMENT:  CLINICAL IMPRESSION: Patient referred to PT for Lumbar radiculopathy. She has previous posterolateral fusion at L4-5 and L5-S1. She has central disc herniation L3-4, and moderate spinal stenosis with disc bulge L2-3 on recent MRI.  Patient will benefit from skilled PT to improve overall function and to address impairments and limitations listed below.  OBJECTIVE IMPAIRMENTS: decreased activity tolerance for ADL's, difficulty walking, decreased endurance, decreased mobility, decreased ROM, decreased strength, impaired flexibility, impaired LE use, and pain.  ACTIVITY LIMITATIONS: bending, liftting, walking, standing, cleaning, community activity, driving, carry, occupation  PERSONAL FACTORS: see above PMH  also affecting patient's functional outcome.  REHAB POTENTIAL: Good  CLINICAL DECISION MAKING:  Stable/uncomplicated  EVALUATION COMPLEXITY: Low    GOALS: Short term PT Goals Target date: 03/03/2024   Pt will be I and compliant with HEP. Baseline:  Goal status: New Pt will decrease pain by 25% overall Baseline: Goal status: New  Long term PT goals Target date:03/17/2024   Pt will improve lumbar ROM to [facility] to improve functional mobility Baseline: Goal status: New Pt will improve  strength to at least 4+/5 MMT to improve functional strength Baseline: Goal status: New Pt will improve Patient specific functional scale (PSFS) to at least 7/10 to show improved function level Baseline: Goal status: New Pt will reduce pain to overall less than 3/10 with usual activity and work activity. Baseline: Goal status: New Pt will be able to ambulate community distances at least 1000 ft WNL gait pattern without complaints Baseline: Goal status: New  PLAN: PT FREQUENCY: 1-2 times per week   PT DURATION: 4-6 weeks  PLANNED INTERVENTIONS  97110-Therapeutic exercises, 97530- Therapeutic activity, [id number]- Neuromuscular re-education, 97535- Self Care, 02859- Manual therapy, and [id number]- Electrical stimulation (unattended)  PLAN FOR NEXT SESSION: review HEP, consider manual therapy for trigger point release and long axis distraction. Extension based program to start  Redell JONELLE Moose, PT,DPT 02/04/2024, 11:57 AM

## 2024-02-15 ENCOUNTER — Encounter: Payer: Self-pay | Admitting: Physical Therapy

## 2024-02-15 ENCOUNTER — Ambulatory Visit: Admitting: Physical Therapy

## 2024-02-15 DIAGNOSIS — R262 Difficulty in walking, not elsewhere classified: Secondary | ICD-10-CM

## 2024-02-15 DIAGNOSIS — M5459 Other low back pain: Secondary | ICD-10-CM | POA: Diagnosis not present

## 2024-02-15 DIAGNOSIS — M6281 Muscle weakness (generalized): Secondary | ICD-10-CM

## 2024-02-15 NOTE — Therapy (Signed)
 OUTPATIENT PHYSICAL THERAPY TREATMENT   Patient Name: Christina Blake MRN: [id number] DOB:[birth year], 53 y.o., female Today's Date: 02/15/2024  END OF SESSION:  PT End of Session - 02/15/24 0803     Visit Number 2    Number of Visits 10    Date for PT Re-Evaluation 03/17/24    PT Start Time 0757    PT Stop Time 0835    PT Time Calculation (min) 38 min    Activity Tolerance Patient tolerated treatment well    Behavior During Therapy [facility] for tasks assessed/performed          Past Medical History:  Diagnosis Date   Anxiety    Cervical neuralgia    Chronic back pain    Depression    History of agoraphobia 2007   Hyperlipidemia    Orthostatic hypotension    Pulmonary embolism, bilateral (HCC) 2012   Trochanteric bursitis of both hips    Past Surgical History:  Procedure Laterality Date   ANTERIOR CERVICAL DECOMP/DISCECTOMY FUSION N/A 09/04/2014   Procedure: Cervical four-five Cervical six-seven Anterior cervical decompression/diskectomy,fusion with exploration of Cervical five-six Fusion (Removal of Helix plate);  Surgeon: Fairy Levels, MD;  Location: [facility];  Service: Neurosurgery;  Laterality: N/A;   BACK SURGERY     C5 & C6 fusion  07/25/12   COLONOSCOPY WITH PROPOFOL  N/A 12/15/2020   Procedure: COLONOSCOPY WITH PROPOFOL ;  Surgeon: Golda Claudis PENNER, MD;  [facility];  Service: Endoscopy;  Laterality: N/A;  7:30   HEMOSTASIS CLIP PLACEMENT  12/15/2020   Procedure: HEMOSTASIS CLIP PLACEMENT;  Surgeon: Golda Claudis PENNER, MD;  [facility];  Service: Endoscopy;;   HOT HEMOSTASIS  12/15/2020   Procedure: HOT HEMOSTASIS (ARGON PLASMA COAGULATION/BICAP);  Surgeon: Golda Claudis PENNER, MD;  [facility];  Service: Endoscopy;;   L4,L5 & S1 fusion  07/23/12   OSTEOTOMY     Left foot 2nd toe-[facility]   POLYPECTOMY  12/15/2020   Procedure: POLYPECTOMY;  Surgeon: Golda Claudis PENNER, MD;  [facility];  Service: Endoscopy;;   Uterine ablation      Patient Active Problem List   Diagnosis Date Noted   Acute exacerbation of chronic low back pain 12/11/2023   Hyperlipidemia 08/02/2021   Back pain of lumbar region with sciatica 08/02/2021   Preventative health care 08/02/2021   History of IBS 07/29/2020   Menorrhagia with regular cycle 06/20/2020   Cervical neuralgia 07/28/2014    PCP: Bluford Jacqulyn MATSU, DO   REFERRING PROVIDER: Onetha Kuba, MD  REFERRING DIAG:  Diagnosis  M54.40 (ICD-10-CM) - Lumbago with sciatica,    Rationale for Evaluation and Treatment:  Rehabiliation  THERAPY DIAG:  Other low back pain  Muscle weakness (generalized)  Difficulty in walking, not elsewhere classified  ONSET DATE: 2013   SUBJECTIVE:  SUBJECTIVE STATEMENT: She did a lot of filing at work already to has leg pain coming in today.   PERTINENT HISTORY:  See above PMH ,previous spinal fusion  PAIN:  NPRS scale: 8/10 upon arrival Pain location:low back and down legs Pain description: constant, catchy Aggravating factors: laying on back or sides, walking, getting in/out of car Relieving factors: resting, sitting, TENS,    PRECAUTIONS: ,  Fusion  RED FLAGS: None   WEIGHT BEARING RESTRICTIONS:  No  FALLS:  Has patient fallen in last 6 months? No   OCCUPATION:  Nurse, typing a lot  PLOF:  Independent  PATIENT GOALS:  Reduce pain  OBJECTIVE:  Note: Objective measures were completed at Evaluation unless otherwise noted.  DIAGNOSTIC FINDINGS:  IMPRESSION: 1. Prior PLIF with posterolateral fusion at L4-5 and L5-S1 with solid arthrodesis 2. Severe spinal stenosis at L3-4 due to central disc herniation and facet arthropathy 3. Moderate spinal stenosis at L2-3 due to severe degenerative disc disease, disc bulge and mild facet  arthropathy  PATIENT SURVEYS:  Patient-Specific Activity Scoring Scheme  0 represents "unable to perform." 10 represents "able to perform at prior level. 0 1 2 3 4 5 6 7 8 9  10 (Date and Score)   Activity Eval     1. Getting in/out car  5    2. Walking   6    3. sleeping 3   4.    5.    Score 4.66/10    Total score = sum of the activity scores/number of activities Minimum detectable change (90%CI) for average score = 2 points Minimum detectable change (90%CI) for single activity score = 3 points  GAIT: Assistive device utilized: None Level of assistance: Complete Independence Comments: pain limits her distance she can walk to less than 20 mintues    LUMBAR ROM:   Active  AROM  eval  Flexion 75%  Extension 50%  Right lateral flexion   Left lateral flexion   Right rotation 50%  Left rotation 50%   (Blank rows = not tested)   LOWER EXTREMITY MMT:    MMT in sitting Right eval Left eval  Hip flexion 3 3  Hip extension    Hip abduction 3 3  Hip adduction    Hip internal rotation    Hip external rotation    Knee flexion 4 4  Knee extension 4 4  Ankle dorsiflexion    Ankle plantarflexion    Ankle inversion    Ankle eversion     (Blank rows = not tested)    SPECIAL TESTS:  Lumbar Straight leg raise test: Positive and Slump test: Positive Some relief with extension movements Some relief with long axis distraction  Palpation: Pain and tenderness in lumbar spine and glute/piriformis with trigger points noted  TREATMENT DATE:  02/15/24 Therex: Nu step L4 X 2 min then stopped due to left leg pain Recumbent bike L1 X 5 min  Supine piriformis stretch knee to opposite shoulder 30 sec X 3 bilat Supine bridge X 10 reps Supine long axis distraction 1 min X 3 Seated slump stretch 3 sec X 10 Standing L stretch 5 sec X 10 Standing lumbar  extensions X 10  Theractivity (Strength for ADL's) Leg press machine 10# 2X10 Hamstring curl machine 10 #2X10 Back extension machine 20# 2X10    Eval HEP creation and review with demonstration and trial set preformed, see below for details Estim X 15 min to low back and piriformis bilat in supine with legs elevated, intensity turned up to tolerance Selfcare:Threacane for home trigger point release, leg elevator for comfort, importance of stretching first thing in the morning.    PATIENT EDUCATION: Education details: HEP, PT plan of care, selfcare Person educated: Patient Education method: Explanation, Demonstration, Verbal cues, and Handouts Education comprehension: verbalized understanding, further education recommended   HOME EXERCISE PROGRAM: Access Code: URL: [email or web address] Date: 02/04/2024 Prepared by: Redell Moose  Exercises - supine IT band and piriformis stretch  - 2 x daily - 6 x weekly - 3 sets - 30 sec hold - Supine Bridge  - 2 x daily - 6 x weekly - 1 sets - 10 reps - 5 sec hold - Modified Thomas Stretch  - 2 x daily - 6 x weekly - 3 sets - 30 sec hold - Standing 'L' Stretch at [employer]  - 2 x daily - 6 x weekly - 1 sets - 10 reps - 5 sec hold - Standing Lumbar Extension  - 2 x daily - 6 x weekly - 1-2 sets - 10 reps - 3 sec hold - Standing Hip Extension with Counter Support  - 2 x daily - 6 x weekly - 1 sets - 10 reps - 3 sec hold - Slump Stretch  - 2 x daily - 6 x weekly - 1 sets - 10 reps - 3 sec hold  ASSESSMENT:  CLINICAL IMPRESSION: She was limited by pain this session. We had to back off and modify some of the exercises. She does show good early compliance and understanding of HEP.  OBJECTIVE IMPAIRMENTS: decreased activity tolerance for ADL's, difficulty walking, decreased endurance, decreased mobility, decreased ROM, decreased strength, impaired flexibility, impaired LE use, and pain.  ACTIVITY LIMITATIONS: bending,  liftting, walking, standing, cleaning, community activity, driving, carry, occupation  PERSONAL FACTORS: see above PMH  also affecting patient's functional outcome.  REHAB POTENTIAL: Good  CLINICAL DECISION MAKING: Stable/uncomplicated  EVALUATION COMPLEXITY: Low    GOALS: Short term PT Goals Target date: 03/03/2024   Pt will be I and compliant with HEP. Baseline:  Goal status: New Pt will decrease pain by 25% overall Baseline: Goal status: New  Long term PT goals Target date:03/17/2024   Pt will improve lumbar ROM to [facility] to improve functional mobility Baseline: Goal status: New Pt will improve  strength to at least 4+/5 MMT to improve functional strength Baseline: Goal status: New Pt will improve Patient specific functional scale (PSFS) to at least 7/10 to show improved function level Baseline: Goal status: New Pt will reduce pain to overall less than 3/10 with usual activity and work activity. Baseline: Goal status: New Pt will be able to ambulate community distances at least 1000 ft WNL gait pattern without complaints Baseline: Goal status: New  PLAN: PT FREQUENCY: 1-2 times per  week   PT DURATION: 4-6 weeks  PLANNED INTERVENTIONS  97110-Therapeutic exercises, 97530- Therapeutic activity, [id number]- Neuromuscular re-education, 97535- Self Care, 02859- Manual therapy, and G0283- Electrical stimulation (unattended)  PLAN FOR NEXT SESSION: review HEP, consider manual therapy for trigger point release and long axis distraction. Extension based program to start  Redell JONELLE Moose, PT,DPT 02/15/2024, 8:40 AM

## 2024-02-20 ENCOUNTER — Encounter: Payer: Self-pay | Admitting: Family Medicine

## 2024-02-20 ENCOUNTER — Ambulatory Visit: Admitting: Physical Therapy

## 2024-02-20 ENCOUNTER — Encounter: Payer: Self-pay | Admitting: Physical Therapy

## 2024-02-20 DIAGNOSIS — M5459 Other low back pain: Secondary | ICD-10-CM

## 2024-02-20 DIAGNOSIS — M6281 Muscle weakness (generalized): Secondary | ICD-10-CM

## 2024-02-20 DIAGNOSIS — R262 Difficulty in walking, not elsewhere classified: Secondary | ICD-10-CM

## 2024-02-20 NOTE — Therapy (Signed)
 OUTPATIENT PHYSICAL THERAPY TREATMENT   Patient Name: Christina Blake MRN: [id number] DOB:[birth year], 53 y.o., female Today's Date: 02/20/2024  END OF SESSION:  PT End of Session - 02/20/24 0945     Visit Number 3    Number of Visits 10    Date for PT Re-Evaluation 03/17/24    PT Start Time 0920    PT Stop Time 1000    PT Time Calculation (min) 40 min    Activity Tolerance Patient tolerated treatment well    Behavior During Therapy [facility] for tasks assessed/performed          Past Medical History:  Diagnosis Date   Anxiety    Cervical neuralgia    Chronic back pain    Depression    History of agoraphobia 2007   Hyperlipidemia    Orthostatic hypotension    Pulmonary embolism, bilateral (HCC) 2012   Trochanteric bursitis of both hips    Past Surgical History:  Procedure Laterality Date   ANTERIOR CERVICAL DECOMP/DISCECTOMY FUSION N/A 09/04/2014   Procedure: Cervical four-five Cervical six-seven Anterior cervical decompression/diskectomy,fusion with exploration of Cervical five-six Fusion (Removal of Helix plate);  Surgeon: Fairy Levels, MD;  Location: [facility];  Service: Neurosurgery;  Laterality: N/A;   BACK SURGERY     C5 & C6 fusion  07/25/12   COLONOSCOPY WITH PROPOFOL  N/A 12/15/2020   Procedure: COLONOSCOPY WITH PROPOFOL ;  Surgeon: Golda Claudis PENNER, MD;  Location: [facility];  Service: Endoscopy;  Laterality: N/A;  7:30   HEMOSTASIS CLIP PLACEMENT  12/15/2020   Procedure: HEMOSTASIS CLIP PLACEMENT;  Surgeon: Golda Claudis PENNER, MD;  Location: [facility];  Service: Endoscopy;;   HOT HEMOSTASIS  12/15/2020   Procedure: HOT HEMOSTASIS (ARGON PLASMA COAGULATION/BICAP);  Surgeon: Golda Claudis PENNER, MD;  Location: [facility];  Service: Endoscopy;;   L4,L5 & S1 fusion  07/23/12   OSTEOTOMY     Left foot 2nd toe-[facility]   POLYPECTOMY  12/15/2020   Procedure: POLYPECTOMY;  Surgeon: Golda Claudis PENNER, MD;  Location: [facility];  Service: Endoscopy;;   Uterine ablation      Patient Active Problem List   Diagnosis Date Noted   Acute exacerbation of chronic low back pain 12/11/2023   Hyperlipidemia 08/02/2021   Back pain of lumbar region with sciatica 08/02/2021   Preventative health care 08/02/2021   History of IBS 07/29/2020   Menorrhagia with regular cycle 06/20/2020   Cervical neuralgia 07/28/2014    PCP: Bluford Jacqulyn MATSU, DO   REFERRING PROVIDER: Onetha Kuba, MD  REFERRING DIAG:  Diagnosis  M54.40 (ICD-10-CM) - Lumbago with sciatica,    Rationale for Evaluation and Treatment:  Rehabiliation  THERAPY DIAG:  Other low back pain  Muscle weakness (generalized)  Difficulty in walking, not elsewhere classified  ONSET DATE: 2013   SUBJECTIVE:  SUBJECTIVE STATEMENT: She says the pain is not any better, is getting worse and really hurting into her hips. She has had a lot of pain and difficulty with driving. She stretches all the time but it only provides short temporary relief. Her work and quality of life are very impacted.   PERTINENT HISTORY:  See above PMH ,previous spinal fusion  PAIN:  NPRS scale: 8/10 upon arrival Pain location:low back and down legs Pain description: constant, catchy Aggravating factors: laying on back or sides, walking, getting in/out of car Relieving factors: resting, sitting, TENS,    PRECAUTIONS: ,  Fusion  RED FLAGS: None   WEIGHT BEARING RESTRICTIONS:  No  FALLS:  Has patient fallen in last 6 months? No   OCCUPATION:  Nurse, typing a lot  PLOF:  Independent  PATIENT GOALS:  Reduce pain  OBJECTIVE:  Note: Objective measures were completed at Evaluation unless otherwise noted.  DIAGNOSTIC FINDINGS:  IMPRESSION: 1. Prior PLIF with posterolateral fusion at L4-5 and L5-S1 with solid arthrodesis 2. Severe  spinal stenosis at L3-4 due to central disc herniation and facet arthropathy 3. Moderate spinal stenosis at L2-3 due to severe degenerative disc disease, disc bulge and mild facet arthropathy  PATIENT SURVEYS:  Patient-Specific Activity Scoring Scheme  0 represents "unable to perform." 10 represents "able to perform at prior level. 0 1 2 3 4 5 6 7 8 9  10 (Date and Score)   Activity Eval     1. Getting in/out car  5    2. Walking   6    3. sleeping 3   4.    5.    Score 4.66/10    Total score = sum of the activity scores/number of activities Minimum detectable change (90%CI) for average score = 2 points Minimum detectable change (90%CI) for single activity score = 3 points  GAIT: Assistive device utilized: None Level of assistance: Complete Independence Comments: pain limits her distance she can walk to less than 20 mintues    LUMBAR ROM:   Active  AROM  eval  Flexion 75%  Extension 50%  Right lateral flexion   Left lateral flexion   Right rotation 50%  Left rotation 50%   (Blank rows = not tested)   LOWER EXTREMITY MMT:    MMT in sitting Right eval Left eval  Hip flexion 3 3  Hip extension    Hip abduction 3 3  Hip adduction    Hip internal rotation    Hip external rotation    Knee flexion 4 4  Knee extension 4 4  Ankle dorsiflexion    Ankle plantarflexion    Ankle inversion    Ankle eversion     (Blank rows = not tested)    SPECIAL TESTS:  Lumbar Straight leg raise test: Positive and Slump test: Positive Some relief with extension movements Some relief with long axis distraction  Palpation: Pain and tenderness in lumbar spine and glute/piriformis with trigger points noted  TREATMENT DATE:  02/15/24 Therex: Recumbent bike L1 X 3 min (stopped due to increased pain in legs) Supine piriformis stretch knee to opposite  shoulder 30 sec X 3 bilat Supine single knee to chest stretch 30 sec X 3 Long axis distraction 60 sec X 3 bilat  Modalities: IFC Estim X 30 minutes with legs elevated and laying supine on hot pack with intensity turned up to tolerance  Self care: education on MRI results and recommendation to follow up with back surgeon as the PT exercises and interventions have not helped and her pain is getting worse and surgery may be indicated.     Eval HEP creation and review with demonstration and trial set preformed, see below for details Estim X 15 min to low back and piriformis bilat in supine with legs elevated, intensity turned up to tolerance Selfcare:Threacane for home trigger point release, leg elevator for comfort, importance of stretching first thing in the morning.    PATIENT EDUCATION: Education details: HEP, PT plan of care, selfcare Person educated: Patient Education method: Explanation, Demonstration, Verbal cues, and Handouts Education comprehension: verbalized understanding, further education recommended   HOME EXERCISE PROGRAM: Access Code: URL: [email or web address] Date: 02/04/2024 Prepared by: Redell Moose  Exercises - supine IT band and piriformis stretch  - 2 x daily - 6 x weekly - 3 sets - 30 sec hold - Supine Bridge  - 2 x daily - 6 x weekly - 1 sets - 10 reps - 5 sec hold - Modified Thomas Stretch  - 2 x daily - 6 x weekly - 3 sets - 30 sec hold - Standing 'L' Stretch at [employer]  - 2 x daily - 6 x weekly - 1 sets - 10 reps - 5 sec hold - Standing Lumbar Extension  - 2 x daily - 6 x weekly - 1-2 sets - 10 reps - 3 sec hold - Standing Hip Extension with Counter Support  - 2 x daily - 6 x weekly - 1 sets - 10 reps - 3 sec hold - Slump Stretch  - 2 x daily - 6 x weekly - 1 sets - 10 reps - 3 sec hold  ASSESSMENT:  CLINICAL IMPRESSION:She has not made any progress with PT thus far. Her pain is becoming worse. She was very uncomfortable this  session and the exercises cause pain. She has been very compliant with HEP but the stretches are the only thing that help and it only helps for a short temporary period. At this point we will try PT for one more visit and if no improvement recommend to discharge and refer back to MD for likely surgical evaluation.   OBJECTIVE IMPAIRMENTS: decreased activity tolerance for ADL's, difficulty walking, decreased endurance, decreased mobility, decreased ROM, decreased strength, impaired flexibility, impaired LE use, and pain.  ACTIVITY LIMITATIONS: bending, liftting, walking, standing, cleaning, community activity, driving, carry, occupation  PERSONAL FACTORS: see above PMH  also affecting patient's functional outcome.  REHAB POTENTIAL: Good  CLINICAL DECISION MAKING: Stable/uncomplicated  EVALUATION COMPLEXITY: Low    GOALS: Short term PT Goals Target date: 03/03/2024   Pt will be I and compliant with HEP. Baseline:  Goal status: New Pt will decrease pain by 25% overall Baseline: Goal status: New  Long term PT goals Target date:03/17/2024   Pt will improve lumbar ROM to [facility] to improve functional mobility Baseline: Goal status: New Pt will improve  strength to at least 4+/5 MMT to improve functional strength Baseline: Goal status: New Pt  will improve Patient specific functional scale (PSFS) to at least 7/10 to show improved function level Baseline: Goal status: New Pt will reduce pain to overall less than 3/10 with usual activity and work activity. Baseline: Goal status: New Pt will be able to ambulate community distances at least 1000 ft WNL gait pattern without complaints Baseline: Goal status: New  PLAN: PT FREQUENCY: 1-2 times per week   PT DURATION: 4-6 weeks  PLANNED INTERVENTIONS  97110-Therapeutic exercises, 97530- Therapeutic activity, [id number]- Neuromuscular re-education, 97535- Self Care, 02859- Manual therapy, and G0283- Electrical stimulation (unattended)  PLAN  FOR NEXT SESSION: referral back to MD if no improvement.   Redell JONELLE Moose, PT,DPT 02/20/2024, 10:32 AM

## 2024-02-21 ENCOUNTER — Other Ambulatory Visit: Payer: Self-pay | Admitting: Family Medicine

## 2024-02-21 MED ORDER — PREGABALIN 75 MG PO CAPS: 75.0000 mg | ORAL_CAPSULE | Freq: Two times a day (BID) | ORAL | 1 refills | Status: DC

## 2024-02-27 ENCOUNTER — Ambulatory Visit: Admitting: Physical Therapy

## 2024-02-27 DIAGNOSIS — M5459 Other low back pain: Secondary | ICD-10-CM

## 2024-02-27 DIAGNOSIS — M6281 Muscle weakness (generalized): Secondary | ICD-10-CM

## 2024-02-27 DIAGNOSIS — R262 Difficulty in walking, not elsewhere classified: Secondary | ICD-10-CM

## 2024-02-27 NOTE — Therapy (Signed)
 OUTPATIENT PHYSICAL THERAPY TREATMENT   Patient Name: Christina Blake MRN: [id number] DOB:17-Jul-1970, 53 y.o., female Today's Date: 02/27/2024  END OF SESSION:  PT End of Session - 02/27/24 0852     Visit Number 4    Number of Visits 10    Date for PT Re-Evaluation 03/17/24    PT Start Time 0851    PT Stop Time 0930    PT Time Calculation (min) 39 min    Activity Tolerance Patient tolerated treatment well    Behavior During Therapy [facility] for tasks assessed/performed           Past Medical History:  Diagnosis Date   Anxiety    Cervical neuralgia    Chronic back pain    Depression    History of agoraphobia 2007   Hyperlipidemia    Orthostatic hypotension    Pulmonary embolism, bilateral (HCC) 2012   Trochanteric bursitis of both hips    Past Surgical History:  Procedure Laterality Date   ANTERIOR CERVICAL DECOMP/DISCECTOMY FUSION N/A 09/04/2014   Procedure: Cervical four-five Cervical six-seven Anterior cervical decompression/diskectomy,fusion with exploration of Cervical five-six Fusion (Removal of Helix plate);  Surgeon: Fairy Levels, MD;  Location: [facility];  Service: Neurosurgery;  Laterality: N/A;   BACK SURGERY     C5 & C6 fusion  07/25/12   COLONOSCOPY WITH PROPOFOL  N/A 12/15/2020   Procedure: COLONOSCOPY WITH PROPOFOL ;  Surgeon: Golda Claudis PENNER, MD;  [facility]: [facility];  Service: Endoscopy;  Laterality: N/A;  7:30   HEMOSTASIS CLIP PLACEMENT  12/15/2020   Procedure: HEMOSTASIS CLIP PLACEMENT;  Surgeon: Golda Claudis PENNER, MD;  [facility]: [facility];  Service: Endoscopy;;   HOT HEMOSTASIS  12/15/2020   Procedure: HOT HEMOSTASIS (ARGON PLASMA COAGULATION/BICAP);  Surgeon: Golda Claudis PENNER, MD;  [facility]: [facility];  Service: Endoscopy;;   L4,L5 & S1 fusion  07/23/12   OSTEOTOMY     Left foot 2nd toe-[facility]   POLYPECTOMY  12/15/2020   Procedure: POLYPECTOMY;  Surgeon: Golda Claudis PENNER, MD;  [facility]: [facility];  Service: Endoscopy;;   Uterine ablation      Patient Active Problem List   Diagnosis Date Noted   Acute exacerbation of chronic low back pain 12/11/2023   Hyperlipidemia 08/02/2021   Back pain of lumbar region with sciatica 08/02/2021   Preventative health care 08/02/2021   History of IBS 07/29/2020   Menorrhagia with regular cycle 06/20/2020   Cervical neuralgia 07/28/2014    PCP: Cook, Jayce G, DO   REFERRING PROVIDER: Onetha Kuba, MD  REFERRING DIAG:  Diagnosis  M54.40 (ICD-10-CM) - Lumbago with sciatica,    Rationale for Evaluation and Treatment:  Rehabiliation  THERAPY DIAG:  No diagnosis found.  ONSET DATE: 2013   SUBJECTIVE:  SUBJECTIVE STATEMENT: Pt states pain remains the same. Exercises seem to irritate symptoms except some continued temporary relief with stretches.   PERTINENT HISTORY:  See above PMH ,previous spinal fusion  PAIN:  NPRS scale: 8/10 upon arrival Pain location:low back and down legs Pain description: constant, catchy Aggravating factors: laying on back or sides, walking, getting in/out of car Relieving factors: resting, sitting, TENS,    PRECAUTIONS: ,  Fusion  RED FLAGS: None   WEIGHT BEARING RESTRICTIONS:  No  FALLS:  Has patient fallen in last 6 months? No   OCCUPATION:  Nurse, typing a lot  PLOF:  Independent  PATIENT GOALS:  Reduce pain  OBJECTIVE:  Note: Objective measures were completed at Evaluation unless otherwise noted.  DIAGNOSTIC FINDINGS:  IMPRESSION: 1. Prior PLIF with posterolateral fusion at L4-5 and L5-S1 with solid arthrodesis 2. Severe spinal stenosis at L3-4 due to central disc herniation and facet arthropathy 3. Moderate spinal stenosis at L2-3 due to severe degenerative disc disease, disc bulge and mild facet arthropathy  PATIENT SURVEYS:   Patient-Specific Activity Scoring Scheme  0 represents "unable to perform." 10 represents "able to perform at prior level. 0 1 2 3 4 5 6 7 8 9  10 (Date and Score)   Activity Eval     1. Getting in/out car  5    2. Walking   6    3. sleeping 3   4.    5.    Score 4.66/10    Total score = sum of the activity scores/number of activities Minimum detectable change (90%CI) for average score = 2 points Minimum detectable change (90%CI) for single activity score = 3 points  GAIT: Assistive device utilized: None Level of assistance: Complete Independence Comments: pain limits her distance she can walk to less than 20 mintues    LUMBAR ROM:   Active  AROM  eval  Flexion 75%  Extension 50%  Right lateral flexion   Left lateral flexion   Right rotation 50%  Left rotation 50%   (Blank rows = not tested)   LOWER EXTREMITY MMT:    MMT in sitting Right eval Left eval  Hip flexion 3 3  Hip extension    Hip abduction 3 3  Hip adduction    Hip internal rotation    Hip external rotation    Knee flexion 4 4  Knee extension 4 4  Ankle dorsiflexion    Ankle plantarflexion    Ankle inversion    Ankle eversion     (Blank rows = not tested)    SPECIAL TESTS:  Lumbar Straight leg raise test: Positive and Slump test: Positive Some relief with extension movements Some relief with long axis distraction  Palpation: Pain and tenderness in lumbar spine and glute/piriformis with trigger points noted  TREATMENT DATE:  02/27/24 Quadruped Child's pose x1 min, with lateral flexion x30 Quadruped TSA contraction x10 Quadruped PPT x5 attempted but started to cause pain on L PSIS Sidelying TSA contraction with diaphragmatic breaths x10, with clamshell x10 Manual therapy: TPR and STM L glute med/max Supine pball ab set with diaphragmatic breaths 10x3 Supine  pball oblique setting with diaphragmatic breaths 10x3 Supine hip adduction iso against pillow with diaphragmatic breaths + TSA contraction 10x3 Supine piriformis stretch x 30  Trigger Point Dry Needling  Initial Treatment: Pt instructed on Dry Needling rational, procedures, and possible side effects. Pt instructed to expect mild to moderate muscle soreness later in the day and/or into the next day.  Pt instructed in methods to reduce muscle soreness. Pt instructed to continue prescribed HEP. Patient verbalized understanding of these instructions and education.   Patient Verbal Consent Given: Yes Education Handout Provided: No pt verbalized understanding Muscles Treated: R glute medius Electrical Stimulation Performed: No Treatment Response/Outcome: Twitch response     02/15/24 Therex: Recumbent bike L1 X 3 min (stopped due to increased pain in legs) Supine piriformis stretch knee to opposite shoulder 30 sec X 3 bilat Supine single knee to chest stretch 30 sec X 3 Long axis distraction 60 sec X 3 bilat  Modalities: IFC Estim X 30 minutes with legs elevated and laying supine on hot pack with intensity turned up to tolerance  Self care: education on MRI results and recommendation to follow up with back surgeon as the PT exercises and interventions have not helped and her pain is getting worse and surgery may be indicated.     Eval HEP creation and review with demonstration and trial set preformed, see below for details Estim X 15 min to low back and piriformis bilat in supine with legs elevated, intensity turned up to tolerance Selfcare:Threacane for home trigger point release, leg elevator for comfort, importance of stretching first thing in the morning.    PATIENT EDUCATION: Education details: HEP, PT plan of care, selfcare Person educated: Patient Education method: Explanation, Demonstration, Verbal cues, and Handouts Education comprehension: verbalized understanding,  further education recommended   HOME EXERCISE PROGRAM: Access Code: URL: [email or web address] Date: 02/04/2024 Prepared by: Redell Moose  Exercises - supine IT band and piriformis stretch  - 2 x daily - 6 x weekly - 3 sets - 30 sec hold - Supine Bridge  - 2 x daily - 6 x weekly - 1 sets - 10 reps - 5 sec hold - Modified Thomas Stretch  - 2 x daily - 6 x weekly - 3 sets - 30 sec hold - Standing 'L' Stretch at [employer]  - 2 x daily - 6 x weekly - 1 sets - 10 reps - 5 sec hold - Standing Lumbar Extension  - 2 x daily - 6 x weekly - 1-2 sets - 10 reps - 3 sec hold - Standing Hip Extension with Counter Support  - 2 x daily - 6 x weekly - 1 sets - 10 reps - 3 sec hold - Slump Stretch  - 2 x daily - 6 x weekly - 1 sets - 10 reps - 3 sec hold  ASSESSMENT:  CLINICAL IMPRESSION: Pt already has scheduled appointment in September with doctor. Treatment focused primarily on finding stabilizing core exercises that did not increase pt's R posterior/lateral hip pain. Pain did not go down her legs when performing quadruped exercises. Did get some R posterior hip pain with just TSA/pelvic tilting in quadruped and during  sidelying clamshell with L LE. Eased after manual therapy. Performed trial of dry needling today to see if it eases pt's issues. May need to consider more pelvic/SIJ origin of her symptoms. At this point pt has not met any of her goals.   OBJECTIVE IMPAIRMENTS: decreased activity tolerance for ADL's, difficulty walking, decreased endurance, decreased mobility, decreased ROM, decreased strength, impaired flexibility, impaired LE use, and pain.  ACTIVITY LIMITATIONS: bending, liftting, walking, standing, cleaning, community activity, driving, carry, occupation  PERSONAL FACTORS: see above PMH  also affecting patient's functional outcome.  REHAB POTENTIAL: Good  CLINICAL DECISION MAKING: Stable/uncomplicated  EVALUATION COMPLEXITY: Low    GOALS: Short term PT  Goals Target date: 03/03/2024   Pt will be I and compliant with HEP. Baseline:  Goal status: New Pt will decrease pain by 25% overall Baseline: Goal status: New  Long term PT goals Target date:03/17/2024   Pt will improve lumbar ROM to [facility] to improve functional mobility Baseline: Goal status: New Pt will improve  strength to at least 4+/5 MMT to improve functional strength Baseline: Goal status: New Pt will improve Patient specific functional scale (PSFS) to at least 7/10 to show improved function level Baseline: Goal status: New Pt will reduce pain to overall less than 3/10 with usual activity and work activity. Baseline: Goal status: New Pt will be able to ambulate community distances at least 1000 ft WNL gait pattern without complaints Baseline: Goal status: New  PLAN: PT FREQUENCY: 1-2 times per week   PT DURATION: 4-6 weeks  PLANNED INTERVENTIONS  97110-Therapeutic exercises, 97530- Therapeutic activity, [id number]- Neuromuscular re-education, 97535- Self Care, 02859- Manual therapy, and G0283- Electrical stimulation (unattended)  PLAN FOR NEXT SESSION: referral back to MD if no improvement.   Marcella Charlson April Ma L Betsabe Iglesia, PT,DPT 02/27/2024, 8:53 AM

## 2024-03-05 ENCOUNTER — Ambulatory Visit: Admitting: Physical Therapy

## 2024-03-13 ENCOUNTER — Other Ambulatory Visit: Payer: Self-pay | Admitting: Neurosurgery

## 2024-03-21 NOTE — Progress Notes (Signed)
 Surgical Instructions   Your procedure is scheduled on Monday, September 29th. Report to [facility] Main Entrance A at 9:20 A.M., then check in with the Admitting office. Any questions or running late day of surgery: call [phone]  Questions prior to your surgery date: call [phone], Monday-Friday, 8am-4pm. If you experience any cold or flu symptoms such as cough, fever, chills, shortness of breath, etc. between now and your scheduled surgery, please notify us  at the above number.     Remember:  Do not eat or drink after midnight the night before your surgery   Take these medicines the morning of surgery with A SIP OF WATER  ALPRAZolam  (XANAX )  methocarbamol  (ROBAXIN )  PARoxetine  (PAXIL )  pregabalin  (LYRICA )    Follow your surgeon's instructions on when to stop Asprin.  If no instructions were given by your surgeon then you will need to call the office to get those instructions.     One week prior to surgery, STOP taking any Aleve, Naproxen, Ibuprofen , Motrin , Advil , Goody's, BC's, all herbal medications, fish oil, and non-prescription vitamins.                     Do NOT Smoke (Tobacco/Vaping) for 24 hours prior to your procedure.  If you use a CPAP at night, you may bring your mask/headgear for your overnight stay.   You will be asked to remove any contacts, glasses, piercing's, hearing aid's, dentures/partials prior to surgery. Please bring cases for these items if needed.    Patients discharged the day of surgery will not be allowed to drive home, and someone needs to stay with them for 24 hours.  SURGICAL WAITING ROOM VISITATION Patients may have no more than 2 support people in the waiting area - these visitors may rotate.   Pre-op nurse will coordinate an appropriate time for 1 ADULT support person, who may not rotate, to accompany patient in pre-op.  Children under the age of 61 must have an adult with them who is not the patient and must remain in the main  waiting area with an adult.  If the patient needs to stay at the hospital during part of their recovery, the visitor guidelines for inpatient rooms apply.  Please refer to the [facility] website for the visitor guidelines for any additional information.   If you received a COVID test during your pre-op visit  it is requested that you wear a mask when out in public, stay away from anyone that may not be feeling well and notify your surgeon if you develop symptoms. If you have been in contact with anyone that has tested positive in the last 10 days please notify you surgeon.      Pre-operative 5 CHG Bathing Instructions   You can play a key role in reducing the risk of infection after surgery. Your skin needs to be as free of germs as possible. You can reduce the number of germs on your skin by washing with CHG (chlorhexidine  gluconate) soap before surgery. CHG is an antiseptic soap that kills germs and continues to kill germs even after washing.   DO NOT use if you have an allergy to chlorhexidine /CHG or antibacterial soaps. If your skin becomes reddened or irritated, stop using the CHG and notify one of our RNs at [phone].   Please shower with the CHG soap starting 4 days before surgery using the following schedule:     Please keep in mind the following:  DO NOT shave, including  legs and underarms, starting the day of your first shower.   You may shave your face at any point before/day of surgery.  Place clean sheets on your bed the day you start using CHG soap. Use a clean washcloth (not used since being washed) for each shower. DO NOT sleep with pets once you start using the CHG.   CHG Shower Instructions:  Wash your face and private area with normal soap. If you choose to wash your hair, wash first with your normal shampoo.  After you use shampoo/soap, rinse your hair and body thoroughly to remove shampoo/soap residue.  Turn the water OFF and apply about 3 tablespoons (45 ml)  of CHG soap to a CLEAN washcloth.  Apply CHG soap ONLY FROM YOUR NECK DOWN TO YOUR TOES (washing for 3-5 minutes)  DO NOT use CHG soap on face, private areas, open wounds, or sores.  Pay special attention to the area where your surgery is being performed.  If you are having back surgery, having someone wash your back for you may be helpful. Wait 2 minutes after CHG soap is applied, then you may rinse off the CHG soap.  Pat dry with a clean towel  Put on clean clothes/pajamas   If you choose to wear lotion, please use ONLY the CHG-compatible lotions that are listed below.  Additional instructions for the day of surgery: DO NOT APPLY any lotions, deodorants, cologne, or perfumes.   Do not bring valuables to the hospital. [facility] is not responsible for any belongings/valuables. Do not wear nail polish, gel polish, artificial nails, or any other type of covering on natural nails (fingers and toes) Do not wear jewelry or makeup Put on clean/comfortable clothes.  Please brush your teeth.  Ask your nurse before applying any prescription medications to the skin.     CHG Compatible Lotions   Aveeno Moisturizing lotion  Cetaphil Moisturizing Cream  Cetaphil Moisturizing Lotion  Clairol Herbal Essence Moisturizing Lotion, Dry Skin  Clairol Herbal Essence Moisturizing Lotion, Extra Dry Skin  Clairol Herbal Essence Moisturizing Lotion, Normal Skin  Curel Age Defying Therapeutic Moisturizing Lotion with Alpha Hydroxy  Curel Extreme Care Body Lotion  Curel Soothing Hands Moisturizing Hand Lotion  Curel Therapeutic Moisturizing Cream, Fragrance-Free  Curel Therapeutic Moisturizing Lotion, Fragrance-Free  Curel Therapeutic Moisturizing Lotion, Original Formula  Eucerin Daily Replenishing Lotion  Eucerin Dry Skin Therapy Plus Alpha Hydroxy Crme  Eucerin Dry Skin Therapy Plus Alpha Hydroxy Lotion  Eucerin Original Crme  Eucerin Original Lotion  Eucerin Plus Crme Eucerin Plus Lotion   Eucerin TriLipid Replenishing Lotion  Keri Anti-Bacterial Hand Lotion  Keri Deep Conditioning Original Lotion Dry Skin Formula Softly Scented  Keri Deep Conditioning Original Lotion, Fragrance Free Sensitive Skin Formula  Keri Lotion Fast Absorbing Fragrance Free Sensitive Skin Formula  Keri Lotion Fast Absorbing Softly Scented Dry Skin Formula  Keri Original Lotion  Keri Skin Renewal Lotion Keri Silky Smooth Lotion  Keri Silky Smooth Sensitive Skin Lotion  Nivea Body Creamy Conditioning Oil  Nivea Body Extra Enriched Lotion  Nivea Body Original Lotion  Nivea Body Sheer Moisturizing Lotion Nivea Crme  Nivea Skin Firming Lotion  NutraDerm 30 Skin Lotion  NutraDerm Skin Lotion  NutraDerm Therapeutic Skin Cream  NutraDerm Therapeutic Skin Lotion  ProShield Protective Hand Cream  Provon moisturizing lotion  Please read over the following fact sheets that you were given.

## 2024-03-24 ENCOUNTER — Encounter (HOSPITAL_COMMUNITY): Payer: Self-pay

## 2024-03-24 ENCOUNTER — Encounter (HOSPITAL_COMMUNITY)
Admission: RE | Admit: 2024-03-24 | Discharge: 2024-03-24 | Disposition: A | Discharge status: Home / Self Care | Source: Ambulatory Visit | Attending: Neurosurgery | Admitting: Neurosurgery

## 2024-03-24 ENCOUNTER — Other Ambulatory Visit: Payer: Self-pay

## 2024-03-24 VITALS — BP 131/74 | HR 85 | Temp 98.4°F | Resp 17 | Ht 67.0 in | Wt 205.6 lb

## 2024-03-24 DIAGNOSIS — Z86711 Personal history of pulmonary embolism: Secondary | ICD-10-CM | POA: Insufficient documentation

## 2024-03-24 DIAGNOSIS — E785 Hyperlipidemia, unspecified: Secondary | ICD-10-CM | POA: Diagnosis not present

## 2024-03-24 DIAGNOSIS — I951 Orthostatic hypotension: Secondary | ICD-10-CM | POA: Diagnosis not present

## 2024-03-24 DIAGNOSIS — M5440 Lumbago with sciatica, unspecified side: Secondary | ICD-10-CM | POA: Diagnosis not present

## 2024-03-24 DIAGNOSIS — I517 Cardiomegaly: Secondary | ICD-10-CM | POA: Diagnosis not present

## 2024-03-24 DIAGNOSIS — G8929 Other chronic pain: Secondary | ICD-10-CM | POA: Insufficient documentation

## 2024-03-24 DIAGNOSIS — Z01818 Encounter for other preprocedural examination: Secondary | ICD-10-CM | POA: Insufficient documentation

## 2024-03-24 DIAGNOSIS — F17200 Nicotine dependence, unspecified, uncomplicated: Secondary | ICD-10-CM | POA: Insufficient documentation

## 2024-03-24 DIAGNOSIS — Z7982 Long term (current) use of aspirin: Secondary | ICD-10-CM | POA: Diagnosis not present

## 2024-03-24 LAB — SURGICAL PCR SCREEN
MRSA, PCR: NEGATIVE
Staphylococcus aureus: NEGATIVE

## 2024-03-24 LAB — BASIC METABOLIC PANEL WITH GFR
Anion gap: 11 (ref 5–15)
BUN: 13 mg/dL (ref 6–20)
CO2: 21 mmol/L — ABNORMAL LOW (ref 22–32)
Calcium: 9 mg/dL (ref 8.9–10.3)
Chloride: 103 mmol/L (ref 98–111)
Creatinine, Ser: 0.87 mg/dL (ref 0.44–1.00)
GFR, Estimated: >60 mL/min (ref >60)
Glucose, Bld: 103 mg/dL — ABNORMAL HIGH (ref 70–99)
Potassium: 4.2 mmol/L (ref 3.5–5.1)
Sodium: 135 mmol/L (ref 135–145)

## 2024-03-24 LAB — TYPE AND SCREEN
ABO/RH(D): AB NEG
Antibody Screen: NEGATIVE

## 2024-03-24 LAB — CBC
HCT: 45.1 % (ref 36.0–46.0)
Hemoglobin: 14.8 g/dL (ref 12.0–15.0)
MCH: 31.2 pg (ref 26.0–34.0)
MCHC: 32.8 g/dL (ref 30.0–36.0)
MCV: 94.9 fL (ref 80.0–100.0)
Platelets: 231 K/uL (ref 150–400)
RBC: 4.75 MIL/uL (ref 3.87–5.11)
RDW: 13 % (ref 11.5–15.5)
WBC: 7.4 K/uL (ref 4.0–10.5)
nRBC: 0 % (ref 0.0–0.2)

## 2024-03-24 NOTE — Progress Notes (Signed)
 PCP - Dr. Jacqulyn Ahle MD Cardiologist - Denies  PPM/ICD - Denies Device Orders - n/a Rep Notified - n/a  Chest x-ray - N/A EKG - 03-24-24 Stress Test - Denies ECHO - 08-15-04 Cardiac Cath - Denies  Sleep Study - Denies CPAP - n/a  Non-diabetic  Last dose of GLP1 agonist-  Denies GLP1 instructions: n/a  Blood Thinner Instructions: Denies Aspirin Instructions: Per patient last dose will be on 03-24-24  ERAS Protcol - NPO @ midnight PRE-SURGERY Ensure or G2- none  COVID TEST- N/A   Anesthesia review: Yes, history of bilateral PE, HLD, orthostatic hypotension  Patient denies shortness of breath, fever, cough and chest pain at PAT appointment. Patient denies any respiratory issues at this time.    All instructions explained to the patient, with a verbal understanding of the material. Patient agrees to go over the instructions while at home for a better understanding. Patient also instructed to self quarantine after being tested for COVID-19. The opportunity to ask questions was provided.

## 2024-03-25 NOTE — Anesthesia Preprocedure Evaluation (Addendum)
 Anesthesia Evaluation  Patient identified by MRN, date of birth, ID band Patient awake    Reviewed: Allergy & Precautions, NPO status , Patient's Chart, lab work & pertinent test results  History of Anesthesia Complications Negative for: history of anesthetic complications  Airway Mallampati: I  TM Distance: >3 FB Neck ROM: Full    Dental  (+) Dental Advisory Given   Pulmonary Current Smoker and Patient abstained from smoking.   breath sounds clear to auscultation       Cardiovascular negative cardio ROS  Rhythm:Regular Rate:Normal     Neuro/Psych   Anxiety Depression    Chronic back pain negative neurological ROS     GI/Hepatic negative GI ROS, Neg liver ROS,,,  Endo/Other  BMI 32  Renal/GU negative Renal ROS     Musculoskeletal   Abdominal   Peds  Hematology Hb 14.8, plt 231k   Anesthesia Other Findings   Reproductive/Obstetrics                              Anesthesia Physical Anesthesia Plan  ASA: 3  Anesthesia Plan: General   Post-op Pain Management: Tylenol  PO (pre-op)*   Induction: Intravenous  PONV Risk Score and Plan: 2 and Ondansetron  and Dexamethasone   Airway Management Planned: Oral ETT  Additional Equipment: None  Intra-op Plan:   Post-operative Plan: Extubation in OR  Informed Consent: I have reviewed the patients History and Physical, chart, labs and discussed the procedure including the risks, benefits and alternatives for the proposed anesthesia with the patient or authorized representative who has indicated his/her understanding and acceptance.     Dental advisory given  Plan Discussed with: CRNA and Surgeon  Anesthesia Plan Comments: (PAT note written 03/25/2024 by Allison Zelenak, PA-C.  )         Anesthesia Quick Evaluation

## 2024-03-25 NOTE — Progress Notes (Signed)
 Anesthesia Chart Review:  Case: [id number] Date/Time: 03/31/24 1108   Procedure: POSTERIOR LUMBAR FUSION 2 LEVEL (Back) - PLIF - L2-L3 - L3-L4 - Posterior Lateral and Interbody fusion- extension of fusion   Anesthesia type: General   Diagnosis: Lumbago of lumbar region with sciatica [M54.40]   Pre-op diagnosis: Lumbago of lumbar region with sciatica   [facility]: [facility] ROOM 21 / [facility] OR   Surgeons: Onetha Kuba, MD       DISCUSSION: Patient is a 53 year old female scheduled for the above procedure.  History includes smoking, orthostatic hypotension, HLD, PE (01/04/2011 in setting of recent travel and pyelonephritis), agoraphobia, anxiety, chronic back pain, spinal surgery (L4-S1 transverse fusion 07/23/2012; C5-6 ACDF 2014; C4-5, C6-7 ACDF, removal of C5-6 Helix plate 12/05/7981), sessile serrated ileocecal polyp (s/p cold snared, margin ablated with APC 12/15/2020).  She reported last aspirin planned for 03/24/2024.  Preoperative labs and EKG reviewed.  Anesthesia team to evaluate on the day of surgery.   VS: BP 131/74   Pulse 85   Temp 36.9 C   Resp 17   Ht 5' 7 (1.702 m)   Wt 93.3 kg   LMP 03/17/2024   SpO2 99%   BMI 32.20 kg/m   PROVIDERS: Cook, Jayce G, DO is PCP    LABS: Labs reviewed: Acceptable for surgery. (all labs ordered are listed, but only abnormal results are displayed)  Labs Reviewed  BASIC METABOLIC PANEL WITH GFR - Abnormal; Notable for the following components:      Result Value   CO2 21 (*)    Glucose, Bld 103 (*)    All other components within normal limits  SURGICAL PCR SCREEN  CBC  TYPE AND SCREEN     IMAGES: MRI L-spine 01/10/2024: IMPRESSION: 1. Prior PLIF with posterolateral fusion at L4-5 and L5-S1 with solid arthrodesis 2. Severe spinal stenosis at L3-4 due to central disc herniation and facet arthropathy 3. Moderate spinal stenosis at L2-3 due to severe degenerative disc disease, disc bulge and mild facet arthropathy    EKG:  03/24/2024: Normal sinus rhythm Possible Left atrial enlargement Borderline ECG - Baseline motion in I, II, III, but overall I think it appears stable when compared to 03/01/2019 tracing.    CV: No recent studies.   Echo 08/15/2004: RESULTS:  1.  Technically adequate echocardiographic study.  2.  Normal left atrium, right atrium and right ventricle.  3.  Normal aortic, mitral, tricuspid and pulmonic valves; normal proximal      pulmonary artery.  4.  Normal internal dimension, wall thickness, regional and global function      of the left ventricle.  5.  Normal IVC.  6.  Normal Doppler study.  Past Medical History:  Diagnosis Date   Anxiety    Cervical neuralgia    Chronic back pain    Depression    History of agoraphobia 2007   Hyperlipidemia    Orthostatic hypotension    Pulmonary embolism, bilateral (HCC) 2012   Trochanteric bursitis of both hips     Past Surgical History:  Procedure Laterality Date   ANTERIOR CERVICAL DECOMP/DISCECTOMY FUSION N/A 09/04/2014   Procedure: Cervical four-five Cervical six-seven Anterior cervical decompression/diskectomy,fusion with exploration of Cervical five-six Fusion (Removal of Helix plate);  Surgeon: Fairy Levels, MD;  Location: [facility];  Service: Neurosurgery;  Laterality: N/A;   BACK SURGERY     C5 & C6 fusion  07/25/12   COLONOSCOPY WITH PROPOFOL  N/A 12/15/2020   Procedure: COLONOSCOPY WITH PROPOFOL ;  Surgeon:  Golda Claudis PENNER, MD;  Location: [facility];  Service: Endoscopy;  Laterality: N/A;  7:30   HEMOSTASIS CLIP PLACEMENT  12/15/2020   Procedure: HEMOSTASIS CLIP PLACEMENT;  Surgeon: Golda Claudis PENNER, MD;  Location: [facility];  Service: Endoscopy;;   HOT HEMOSTASIS  12/15/2020   Procedure: HOT HEMOSTASIS (ARGON PLASMA COAGULATION/BICAP);  Surgeon: Golda Claudis PENNER, MD;  Location: [facility];  Service: Endoscopy;;   L4,L5 & S1 fusion  07/23/12   OSTEOTOMY     Left foot 2nd toe-[facility]   POLYPECTOMY  12/15/2020   Procedure:  POLYPECTOMY;  Surgeon: Golda Claudis PENNER, MD;  Location: [facility];  Service: Endoscopy;;   Uterine ablation      MEDICATIONS:  ALPRAZolam  (XANAX ) 0.5 MG tablet   aspirin EC 81 MG tablet   lamoTRIgine  (LAMICTAL ) 200 MG tablet   methocarbamol  (ROBAXIN ) 500 MG tablet   methocarbamol  (ROBAXIN ) 750 MG tablet   PARoxetine  (PAXIL ) 20 MG tablet   pregabalin  (LYRICA ) 75 MG capsule   progesterone  (PROMETRIUM ) 200 MG capsule   temazepam  (RESTORIL ) 15 MG capsule   traZODone  (DESYREL ) 150 MG tablet   No current facility-administered medications for this encounter.    Isaiah Ruder, PA-C Surgical Short Stay/Anesthesiology [facility] Phone [phone] [facility] Phone [phone] 03/25/2024 6:31 PM

## 2024-03-28 NOTE — Progress Notes (Signed)
Patient was called to be informed that the surgery time for Monday was changed to 07:30 o'clock. Patient was instructed to be at the hospital at 05:30 o'clock. Patient verbalized understanding.

## 2024-03-31 ENCOUNTER — Inpatient Hospital Stay (HOSPITAL_COMMUNITY): Payer: Self-pay | Admitting: Anesthesiology

## 2024-03-31 ENCOUNTER — Inpatient Hospital Stay (HOSPITAL_COMMUNITY): Payer: Self-pay | Admitting: Vascular Surgery

## 2024-03-31 ENCOUNTER — Encounter (HOSPITAL_COMMUNITY): Admission: RE | Disposition: A | Payer: Self-pay | Source: Home / Self Care | Attending: Neurosurgery

## 2024-03-31 ENCOUNTER — Other Ambulatory Visit: Payer: Self-pay

## 2024-03-31 ENCOUNTER — Inpatient Hospital Stay (HOSPITAL_COMMUNITY)

## 2024-03-31 ENCOUNTER — Inpatient Hospital Stay (HOSPITAL_COMMUNITY)
Admission: RE | Admit: 2024-03-31 | Discharge: 2024-04-01 | DRG: 428 | Disposition: A | Discharge status: Home / Self Care | Attending: Neurosurgery | Admitting: Neurosurgery

## 2024-03-31 DIAGNOSIS — G8929 Other chronic pain: Secondary | ICD-10-CM | POA: Diagnosis present

## 2024-03-31 DIAGNOSIS — M5440 Lumbago with sciatica, unspecified side: Secondary | ICD-10-CM | POA: Diagnosis present

## 2024-03-31 DIAGNOSIS — Z79899 Other long term (current) drug therapy: Secondary | ICD-10-CM | POA: Diagnosis not present

## 2024-03-31 DIAGNOSIS — Z981 Arthrodesis status: Secondary | ICD-10-CM

## 2024-03-31 DIAGNOSIS — Z86711 Personal history of pulmonary embolism: Secondary | ICD-10-CM | POA: Diagnosis not present

## 2024-03-31 DIAGNOSIS — M5126 Other intervertebral disc displacement, lumbar region: Secondary | ICD-10-CM | POA: Diagnosis present

## 2024-03-31 DIAGNOSIS — M48061 Spinal stenosis, lumbar region without neurogenic claudication: Principal | ICD-10-CM | POA: Diagnosis present

## 2024-03-31 DIAGNOSIS — Z7982 Long term (current) use of aspirin: Secondary | ICD-10-CM | POA: Diagnosis not present

## 2024-03-31 DIAGNOSIS — M545 Low back pain, unspecified: Principal | ICD-10-CM

## 2024-03-31 DIAGNOSIS — F17210 Nicotine dependence, cigarettes, uncomplicated: Secondary | ICD-10-CM | POA: Diagnosis present

## 2024-03-31 DIAGNOSIS — M47819 Spondylosis without myelopathy or radiculopathy, site unspecified: Secondary | ICD-10-CM | POA: Diagnosis present

## 2024-03-31 DIAGNOSIS — Z8249 Family history of ischemic heart disease and other diseases of the circulatory system: Secondary | ICD-10-CM | POA: Diagnosis not present

## 2024-03-31 DIAGNOSIS — F4000 Agoraphobia, unspecified: Secondary | ICD-10-CM | POA: Diagnosis present

## 2024-03-31 DIAGNOSIS — E785 Hyperlipidemia, unspecified: Secondary | ICD-10-CM | POA: Diagnosis present

## 2024-03-31 DIAGNOSIS — F418 Other specified anxiety disorders: Secondary | ICD-10-CM

## 2024-03-31 DIAGNOSIS — Z8719 Personal history of other diseases of the digestive system: Secondary | ICD-10-CM | POA: Diagnosis not present

## 2024-03-31 LAB — POCT PREGNANCY, URINE: Preg Test, Ur: NEGATIVE

## 2024-03-31 SURGERY — POSTERIOR LUMBAR FUSION 2 LEVEL
Anesthesia: General | Site: Back

## 2024-03-31 MED ORDER — LIDOCAINE-EPINEPHRINE 1 %-1:100000 IJ SOLN
INTRAMUSCULAR | Status: DC | PRN
  Administered 2024-03-31: 10 mL

## 2024-03-31 MED ORDER — FENTANYL CITRATE (PF) 250 MCG/5ML IJ SOLN
INTRAMUSCULAR | Status: AC
  Filled 2024-03-31: qty 5

## 2024-03-31 MED ORDER — ONDANSETRON HCL 4 MG/2ML IJ SOLN
INTRAMUSCULAR | Status: DC | PRN
  Administered 2024-03-31: 4 mg via INTRAVENOUS

## 2024-03-31 MED ORDER — PANTOPRAZOLE SODIUM 40 MG PO TBEC
40.0000 mg | DELAYED_RELEASE_TABLET | Freq: Every day | ORAL | Status: DC
  Administered 2024-03-31: 40 mg via ORAL
  Filled 2024-03-31: qty 1

## 2024-03-31 MED ORDER — DEXAMETHASONE SODIUM PHOSPHATE 10 MG/ML IJ SOLN
INTRAMUSCULAR | Status: DC | PRN
  Administered 2024-03-31: 10 mg via INTRAVENOUS

## 2024-03-31 MED ORDER — BUPIVACAINE LIPOSOME 1.3 % IJ SUSP
INTRAMUSCULAR | Status: AC
  Filled 2024-03-31: qty 20

## 2024-03-31 MED ORDER — THROMBIN 5000 UNITS EX KIT
PACK | CUTANEOUS | Status: AC
Start: 2024-03-31 — End: 2024-03-31
  Filled 2024-03-31: qty 1

## 2024-03-31 MED ORDER — PAROXETINE HCL 20 MG PO TABS
20.0000 mg | ORAL_TABLET | Freq: Every day | ORAL | Status: DC
Start: 2024-03-31 — End: 2024-04-01
  Administered 2024-03-31 – 2024-04-01 (×2): 20 mg via ORAL
  Filled 2024-03-31 (×2): qty 1

## 2024-03-31 MED ORDER — HYDROCODONE-ACETAMINOPHEN 5-325 MG PO TABS
2.0000 | ORAL_TABLET | ORAL | Status: DC | PRN
  Administered 2024-03-31 – 2024-04-01 (×5): 2 via ORAL
  Filled 2024-03-31 (×5): qty 2

## 2024-03-31 MED ORDER — HYDROMORPHONE HCL 1 MG/ML IJ SOLN
INTRAMUSCULAR | Status: AC
  Filled 2024-03-31: qty 0.5

## 2024-03-31 MED ORDER — MIDAZOLAM HCL 2 MG/2ML IJ SOLN
INTRAMUSCULAR | Status: AC
  Filled 2024-03-31: qty 2

## 2024-03-31 MED ORDER — PHENYLEPHRINE 80 MCG/ML (10ML) SYRINGE FOR IV PUSH (FOR BLOOD PRESSURE SUPPORT)
PREFILLED_SYRINGE | INTRAVENOUS | Status: DC | PRN
  Administered 2024-03-31: 160 ug via INTRAVENOUS
  Administered 2024-03-31: 80 ug via INTRAVENOUS

## 2024-03-31 MED ORDER — ONDANSETRON HCL 4 MG PO TABS: 4.0000 mg | ORAL_TABLET | Freq: Four times a day (QID) | ORAL | Status: DC | PRN

## 2024-03-31 MED ORDER — THROMBIN 20000 UNITS EX SOLR
CUTANEOUS | Status: DC | PRN
  Administered 2024-03-31: 20 mL via TOPICAL

## 2024-03-31 MED ORDER — ACETAMINOPHEN 500 MG PO TABS
1000.0000 mg | ORAL_TABLET | Freq: Once | ORAL | Status: AC
  Administered 2024-03-31: 1000 mg via ORAL
  Filled 2024-03-31: qty 2

## 2024-03-31 MED ORDER — METHOCARBAMOL 750 MG PO TABS
750.0000 mg | ORAL_TABLET | Freq: Four times a day (QID) | ORAL | Status: DC | PRN
  Administered 2024-03-31 – 2024-04-01 (×4): 750 mg via ORAL
  Filled 2024-03-31 (×4): qty 1

## 2024-03-31 MED ORDER — SODIUM CHLORIDE 0.9% FLUSH: 3.0000 mL | INTRAVENOUS | Status: DC | PRN

## 2024-03-31 MED ORDER — LAMOTRIGINE 100 MG PO TABS
200.0000 mg | ORAL_TABLET | Freq: Every day | ORAL | Status: DC
  Administered 2024-03-31: 200 mg via ORAL
  Filled 2024-03-31: qty 2

## 2024-03-31 MED ORDER — ALPRAZOLAM 0.5 MG PO TABS
0.5000 mg | ORAL_TABLET | Freq: Every evening | ORAL | Status: DC | PRN
  Administered 2024-03-31: 0.5 mg via ORAL
  Filled 2024-03-31: qty 1

## 2024-03-31 MED ORDER — PROPOFOL 10 MG/ML IV BOLUS
INTRAVENOUS | Status: AC
  Filled 2024-03-31: qty 20

## 2024-03-31 MED ORDER — OXYCODONE HCL 5 MG/5ML PO SOLN: 5.0000 mg | Freq: Once | ORAL | Status: DC | PRN

## 2024-03-31 MED ORDER — LIDOCAINE 2% (20 MG/ML) 5 ML SYRINGE
INTRAMUSCULAR | Status: DC | PRN
  Administered 2024-03-31: 40 mg via INTRAVENOUS

## 2024-03-31 MED ORDER — MENTHOL 3 MG MT LOZG: 1.0000 | LOZENGE | OROMUCOSAL | Status: DC | PRN

## 2024-03-31 MED ORDER — PREGABALIN 75 MG PO CAPS
75.0000 mg | ORAL_CAPSULE | Freq: Two times a day (BID) | ORAL | Status: DC
  Administered 2024-03-31 – 2024-04-01 (×3): 75 mg via ORAL
  Filled 2024-03-31 (×3): qty 1

## 2024-03-31 MED ORDER — METHOCARBAMOL 500 MG PO TABS
500.0000 mg | ORAL_TABLET | Freq: Four times a day (QID) | ORAL | Status: DC
  Administered 2024-04-01: 500 mg via ORAL
  Filled 2024-03-31 (×3): qty 1

## 2024-03-31 MED ORDER — LIDOCAINE 2% (20 MG/ML) 5 ML SYRINGE
INTRAMUSCULAR | Status: AC
  Filled 2024-03-31: qty 5

## 2024-03-31 MED ORDER — SODIUM CHLORIDE 0.9% FLUSH
3.0000 mL | Freq: Two times a day (BID) | INTRAVENOUS | Status: DC
Start: 2024-03-31 — End: 2024-04-01
  Administered 2024-03-31 (×2): 3 mL via INTRAVENOUS

## 2024-03-31 MED ORDER — BUPIVACAINE LIPOSOME 1.3 % IJ SUSP
INTRAMUSCULAR | Status: DC | PRN
  Administered 2024-03-31: 20 mL

## 2024-03-31 MED ORDER — OXYCODONE HCL 5 MG PO TABS: 5.0000 mg | ORAL_TABLET | Freq: Once | ORAL | Status: DC | PRN

## 2024-03-31 MED ORDER — MIDAZOLAM HCL 2 MG/2ML IJ SOLN: 0.5000 mg | Freq: Once | INTRAMUSCULAR | Status: DC | PRN

## 2024-03-31 MED ORDER — SUGAMMADEX SODIUM 200 MG/2ML IV SOLN
INTRAVENOUS | Status: DC | PRN
  Administered 2024-03-31: 200 mg via INTRAVENOUS

## 2024-03-31 MED ORDER — ALUM & MAG HYDROXIDE-SIMETH 200-200-20 MG/5ML PO SUSP: 30.0000 mL | Freq: Four times a day (QID) | ORAL | Status: DC | PRN

## 2024-03-31 MED ORDER — TRAZODONE HCL 50 MG PO TABS
300.0000 mg | ORAL_TABLET | Freq: Every day | ORAL | Status: DC
  Administered 2024-03-31: 300 mg via ORAL
  Filled 2024-03-31: qty 6

## 2024-03-31 MED ORDER — PROGESTERONE 200 MG PO CAPS
200.0000 mg | ORAL_CAPSULE | Freq: Every day | ORAL | Status: DC
  Administered 2024-04-01: 200 mg via ORAL
  Filled 2024-03-31 (×2): qty 1

## 2024-03-31 MED ORDER — SODIUM CHLORIDE 0.9 % IV SOLN
250.0000 mL | INTRAVENOUS | Status: DC
  Administered 2024-03-31: 250 mL via INTRAVENOUS

## 2024-03-31 MED ORDER — ORAL CARE MOUTH RINSE: 15.0000 mL | Freq: Once | OROMUCOSAL | Status: AC

## 2024-03-31 MED ORDER — CHLORHEXIDINE GLUCONATE CLOTH 2 % EX PADS: 6.0000 | MEDICATED_PAD | Freq: Once | CUTANEOUS | Status: DC

## 2024-03-31 MED ORDER — ONDANSETRON HCL 4 MG/2ML IJ SOLN: 4.0000 mg | Freq: Four times a day (QID) | INTRAMUSCULAR | Status: DC | PRN

## 2024-03-31 MED ORDER — CEFAZOLIN SODIUM-DEXTROSE 2-4 GM/100ML-% IV SOLN
2.0000 g | Freq: Three times a day (TID) | INTRAVENOUS | Status: DC
  Administered 2024-03-31 – 2024-04-01 (×2): 2 g via INTRAVENOUS
  Filled 2024-03-31 (×2): qty 100

## 2024-03-31 MED ORDER — CEFAZOLIN SODIUM-DEXTROSE 2-4 GM/100ML-% IV SOLN
2.0000 g | INTRAVENOUS | Status: AC
  Administered 2024-03-31 (×2): 2 g via INTRAVENOUS
  Filled 2024-03-31: qty 100

## 2024-03-31 MED ORDER — HYDROMORPHONE HCL 1 MG/ML IJ SOLN
INTRAMUSCULAR | Status: DC | PRN
  Administered 2024-03-31 (×3): .5 mg via INTRAVENOUS

## 2024-03-31 MED ORDER — ASPIRIN 81 MG PO TBEC
81.0000 mg | DELAYED_RELEASE_TABLET | Freq: Every day | ORAL | Status: DC
  Administered 2024-04-01: 81 mg via ORAL
  Filled 2024-03-31: qty 1

## 2024-03-31 MED ORDER — FENTANYL CITRATE (PF) 250 MCG/5ML IJ SOLN
INTRAMUSCULAR | Status: DC | PRN
  Administered 2024-03-31 (×5): 50 ug via INTRAVENOUS

## 2024-03-31 MED ORDER — LIDOCAINE-EPINEPHRINE 1 %-1:100000 IJ SOLN
INTRAMUSCULAR | Status: AC
  Filled 2024-03-31: qty 1

## 2024-03-31 MED ORDER — LACTATED RINGERS IV SOLN
INTRAVENOUS | Status: DC
Start: 2024-03-31 — End: 2024-03-31

## 2024-03-31 MED ORDER — 0.9 % SODIUM CHLORIDE (POUR BTL) OPTIME
TOPICAL | Status: DC | PRN
  Administered 2024-03-31 (×2): 1000 mL

## 2024-03-31 MED ORDER — MIDAZOLAM HCL 2 MG/2ML IJ SOLN
INTRAMUSCULAR | Status: DC | PRN
  Administered 2024-03-31: 2 mg via INTRAVENOUS

## 2024-03-31 MED ORDER — CHLORHEXIDINE GLUCONATE CLOTH 2 % EX PADS
6.0000 | MEDICATED_PAD | Freq: Once | CUTANEOUS | Status: DC
Start: 2024-03-31 — End: 2024-03-31

## 2024-03-31 MED ORDER — TEMAZEPAM 15 MG PO CAPS
45.0000 mg | ORAL_CAPSULE | Freq: Every day | ORAL | Status: DC
  Administered 2024-03-31: 45 mg via ORAL
  Filled 2024-03-31: qty 3

## 2024-03-31 MED ORDER — THROMBIN 20000 UNITS EX SOLR
CUTANEOUS | Status: AC
  Filled 2024-03-31: qty 20000

## 2024-03-31 MED ORDER — HYDROMORPHONE HCL 1 MG/ML IJ SOLN: 0.5000 mg | INTRAMUSCULAR | Status: DC | PRN

## 2024-03-31 MED ORDER — PANTOPRAZOLE SODIUM 40 MG IV SOLR: 40.0000 mg | Freq: Every day | INTRAVENOUS | Status: DC

## 2024-03-31 MED ORDER — ACETAMINOPHEN 325 MG PO TABS: 650.0000 mg | ORAL_TABLET | ORAL | Status: DC | PRN

## 2024-03-31 MED ORDER — MEPERIDINE HCL 25 MG/ML IJ SOLN
6.2500 mg | INTRAMUSCULAR | Status: DC | PRN
  Filled 2024-03-31: qty 1

## 2024-03-31 MED ORDER — PROPOFOL 10 MG/ML IV BOLUS
INTRAVENOUS | Status: DC | PRN
  Administered 2024-03-31: 120 mg via INTRAVENOUS

## 2024-03-31 MED ORDER — CHLORHEXIDINE GLUCONATE 0.12 % MT SOLN
15.0000 mL | Freq: Once | OROMUCOSAL | Status: AC
  Administered 2024-03-31: 15 mL via OROMUCOSAL
  Filled 2024-03-31: qty 15

## 2024-03-31 MED ORDER — ALBUMIN HUMAN 5 % IV SOLN: INTRAVENOUS | Status: DC | PRN

## 2024-03-31 MED ORDER — BUPIVACAINE HCL (PF) 0.25 % IJ SOLN
INTRAMUSCULAR | Status: AC
  Filled 2024-03-31: qty 30

## 2024-03-31 MED ORDER — PHENOL 1.4 % MT LIQD: 1.0000 | OROMUCOSAL | Status: DC | PRN

## 2024-03-31 MED ORDER — ROCURONIUM BROMIDE 10 MG/ML (PF) SYRINGE
PREFILLED_SYRINGE | INTRAVENOUS | Status: AC
Start: 2024-03-31 — End: 2024-03-31
  Filled 2024-03-31: qty 10

## 2024-03-31 MED ORDER — DEXAMETHASONE SODIUM PHOSPHATE 10 MG/ML IJ SOLN
INTRAMUSCULAR | Status: AC
  Filled 2024-03-31: qty 1

## 2024-03-31 MED ORDER — HYDROMORPHONE HCL 1 MG/ML IJ SOLN
0.2500 mg | INTRAMUSCULAR | Status: DC | PRN
  Administered 2024-03-31 (×2): 0.5 mg via INTRAVENOUS

## 2024-03-31 MED ORDER — ONDANSETRON HCL 4 MG/2ML IJ SOLN
INTRAMUSCULAR | Status: AC
  Filled 2024-03-31: qty 2

## 2024-03-31 MED ORDER — HYDROMORPHONE HCL 1 MG/ML IJ SOLN
INTRAMUSCULAR | Status: AC
  Filled 2024-03-31: qty 1

## 2024-03-31 MED ORDER — PHENYLEPHRINE HCL-NACL 20-0.9 MG/250ML-% IV SOLN
INTRAVENOUS | Status: DC | PRN
  Administered 2024-03-31: 40 ug/min via INTRAVENOUS

## 2024-03-31 MED ORDER — ROCURONIUM BROMIDE 10 MG/ML (PF) SYRINGE
PREFILLED_SYRINGE | INTRAVENOUS | Status: DC | PRN
  Administered 2024-03-31: 10 mg via INTRAVENOUS
  Administered 2024-03-31: 60 mg via INTRAVENOUS
  Administered 2024-03-31 (×6): 20 mg via INTRAVENOUS

## 2024-03-31 MED ORDER — ACETAMINOPHEN 650 MG RE SUPP: 650.0000 mg | RECTAL | Status: DC | PRN

## 2024-03-31 SURGICAL SUPPLY — 69 items
BAG COUNTER SPONGE SURGICOUNT (BAG) ×1 IMPLANT
BASKET BONE COLLECTION (BASKET) ×1 IMPLANT
BENZOIN TINCTURE PRP APPL 2/3 (GAUZE/BANDAGES/DRESSINGS) ×1 IMPLANT
BLADE BONE MILL MEDIUM (MISCELLANEOUS) ×1 IMPLANT
BLADE CLIPPER SURG (BLADE) IMPLANT
BLADE SURG 11 STRL SS (BLADE) ×1 IMPLANT
BUR CUTTER 7.0 ROUND (BURR) ×1 IMPLANT
BUR MATCHSTICK NEURO 3.0 LAGG (BURR) ×1 IMPLANT
CANISTER SUCTION 3000ML PPV (SUCTIONS) ×1 IMPLANT
CNTNR URN SCR LID CUP LEK RST (MISCELLANEOUS) ×1 IMPLANT
COVER BACK TABLE 60X90IN (DRAPES) ×1 IMPLANT
DERMABOND ADVANCED .7 DNX12 (GAUZE/BANDAGES/DRESSINGS) ×1 IMPLANT
DRAPE C-ARM 42X72 X-RAY (DRAPES) ×1 IMPLANT
DRAPE C-ARMOR (DRAPES) IMPLANT
DRAPE HALF SHEET 40X57 (DRAPES) IMPLANT
DRAPE LAPAROTOMY 100X72X124 (DRAPES) ×1 IMPLANT
DRAPE SURG 17X23 STRL (DRAPES) ×1 IMPLANT
DRSG OPSITE 4X5.5 SM (GAUZE/BANDAGES/DRESSINGS) ×1 IMPLANT
DRSG OPSITE POSTOP 4X6 (GAUZE/BANDAGES/DRESSINGS) ×1 IMPLANT
DRSG OPSITE POSTOP 4X8 (GAUZE/BANDAGES/DRESSINGS) IMPLANT
DURAPREP 26ML APPLICATOR (WOUND CARE) ×1 IMPLANT
ELECTRODE REM PT RTRN 9FT ADLT (ELECTROSURGICAL) ×1 IMPLANT
EVACUATOR 1/8 PVC DRAIN (DRAIN) ×1 IMPLANT
GAUZE 4X4 16PLY ~~LOC~~+RFID DBL (SPONGE) IMPLANT
GAUZE SPONGE 4X4 12PLY STRL (GAUZE/BANDAGES/DRESSINGS) ×1 IMPLANT
GLOVE BIO SURGEON STRL SZ7 (GLOVE) IMPLANT
GLOVE BIO SURGEON STRL SZ7.5 (GLOVE) IMPLANT
GLOVE BIO SURGEON STRL SZ8 (GLOVE) ×2 IMPLANT
GLOVE BIOGEL PI IND STRL 7.0 (GLOVE) IMPLANT
GLOVE BIOGEL PI IND STRL 7.5 (GLOVE) IMPLANT
GLOVE EXAM NITRILE XL STR (GLOVE) IMPLANT
GLOVE INDICATOR 8.0 STRL GRN (GLOVE) IMPLANT
GLOVE INDICATOR 8.5 STRL (GLOVE) ×2 IMPLANT
GOWN STRL REUS W/ TWL LRG LVL3 (GOWN DISPOSABLE) IMPLANT
GOWN STRL REUS W/ TWL XL LVL3 (GOWN DISPOSABLE) ×2 IMPLANT
GOWN STRL REUS W/TWL 2XL LVL3 (GOWN DISPOSABLE) IMPLANT
GRAFT BNE MATRIX VG FRMBL L 10 (Bone Implant) IMPLANT
KIT BASIN OR (CUSTOM PROCEDURE TRAY) ×1 IMPLANT
KIT TURNOVER KIT B (KITS) ×1 IMPLANT
MILL BONE PREP (MISCELLANEOUS) ×1 IMPLANT
NDL HYPO 21X1.5 SAFETY (NEEDLE) ×1 IMPLANT
NDL HYPO 25X1 1.5 SAFETY (NEEDLE) ×1 IMPLANT
NEEDLE HYPO 21X1.5 SAFETY (NEEDLE) ×1 IMPLANT
NEEDLE HYPO 25X1 1.5 SAFETY (NEEDLE) ×1 IMPLANT
PACK LAMINECTOMY NEURO (CUSTOM PROCEDURE TRAY) ×1 IMPLANT
PAD ARMBOARD POSITIONER FOAM (MISCELLANEOUS) ×3 IMPLANT
ROD LORD LIPPED TI 5.5X100 (Rod) IMPLANT
SCREW CANC PA 6.5X40 (Screw) IMPLANT
SCREW CANC PA 6.5X45 (Screw) IMPLANT
SCREW SET SPINAL STD HEXALOBE (Screw) IMPLANT
SET SCREW SPNE (Screw) IMPLANT
SOLN 0.9% NACL 1000 ML (IV SOLUTION) ×2 IMPLANT
SOLN 0.9% NACL POUR BTL 1000ML (IV SOLUTION) ×1 IMPLANT
SOLN STERILE WATER 1000 ML (IV SOLUTION) ×1 IMPLANT
SOLN STERILE WATER BTL 1000 ML (IV SOLUTION) ×1 IMPLANT
SPACER SUSTAIN RT 12 8D 9X26 (Spacer) IMPLANT
SPACER SUSTAIN RT 13 15D 10X26 (Spacer) IMPLANT
SPIKE FLUID TRANSFER (MISCELLANEOUS) ×1 IMPLANT
SPONGE SURGIFOAM ABS GEL 100 (HEMOSTASIS) ×1 IMPLANT
SPONGE T-LAP 4X18 ~~LOC~~+RFID (SPONGE) IMPLANT
STRIP CLOSURE SKIN 1/2X4 (GAUZE/BANDAGES/DRESSINGS) ×2 IMPLANT
SUT VIC AB 0 CT1 18XCR BRD8 (SUTURE) ×1 IMPLANT
SUT VIC AB 2-0 CT1 18 (SUTURE) ×1 IMPLANT
SUT VIC AB 4-0 PS2 27 (SUTURE) ×1 IMPLANT
SYR 20ML LL LF (SYRINGE) ×1 IMPLANT
TOWEL GREEN STERILE (TOWEL DISPOSABLE) ×1 IMPLANT
TOWEL GREEN STERILE FF (TOWEL DISPOSABLE) ×1 IMPLANT
TRAY FOLEY MTR SLVR 14FR STAT (SET/KITS/TRAYS/PACK) IMPLANT
TRAY FOLEY MTR SLVR 16FR STAT (SET/KITS/TRAYS/PACK) ×1 IMPLANT

## 2024-03-31 NOTE — Op Note (Signed)
 Preoperative diagnosis: Lumbar spinal stenosis degenerative disc disease herniated nucleus pulposus L2-3 L3-4 with segmental instability above her previous fusion from L4-S1.  Postoperative diagnosis: Same.  Procedure: #1 decompressive laminectomies L2-3 L3-4 with complete facetectomy radical foraminotomy removal of the pars in excess and requiring more work than would be needed with a standard interbody fusion..  2.  Posterior lumbar to body fusion L2-3 L3-4 utilizing the globus titanium cages packed with locally harvested autograft mixed with Vivigen.  3.  Pedicle screw fixation L2-3 L3-4 utilizing the Alphatec pedicle screw system tying into existing screws at L4 with new screws at L2 and L3.  4.  Posterolateral arthrodesis L2-3 L3-4 utilizing locally harvested autograft mixed with Vivigen.  5.  Expiration fusion move of hardware with removal of rods and knots and pre-existing L5-S1 screws.  Surgeon: Arley helling.  Assistant: Suzen Click.  Anesthesia: General  EBL: 250.  HPI: 53 year old female previously undergone L4-S1 interbody fusion did very well.  Presented with progressive worsening back and bilateral hip and leg pain workup revealed severe spinal stenosis degenerative collapse L2-3 L3-4 and do the patient's progression of clinical syndrome imaging findings and failed conservative treatment I recommended decompressive laminectomies and interbody fusions at L2-3 and L3-4 exploration of fusion removal of hardware L4-S1 and instrumented posterolateral arthrodesis.  I extensively reviewed the risks and benefits of the operation with the patient as well as perioperative course expectations of outcome and alternatives to surgery and he understood and agreed to proceed forward.  Operative procedure: Patient was brought into the OR was induced under general anesthesia positioned prone on the Wilson frame her back was prepped and draped in routine sterile fashion.  Roll incision was  identified and extended cephalad and after infiltration of 10 cc lidocaine  with epi this incision was made scar tissue was dissected free subperiosteal dissection was carried lamina of L to L3 and expose the hardware from L4-S1.  Previous fusion did appear to be solid I disconnected the knots remove the rods there was extensive bony overgrowth on the right side took a while to free that up out of tibial to remove the rods I then removed the screws at L5 and S1 left the score for screws behind.  Then remove the spinous processes at L2 and L3 central decompression was begun marked facet arthropathy was causing severe hourglass compression of thecal sac with marked stenosis at both levels L2-3 and L3-4.  I then drilled into the facet joints drilled through the pars and did complete facetectomies and radical foraminotomies the L2, L3 and L4 nerve roots.  Aggressively under bit the superior tickling facet to gain access to the lateral margin of the space.  At this point cleaned out the disc base sequentially prepared the endplate sequentially removed a lot of central disc material and then were first working at L3-4 opened up the disc space utilizing trials and paddle shavers and curettes prepared the endplates selected titanium cages 10 mm expanded to 13 packed it with locally harvested autograft mixed with Vivigen and inserted with an extensive mount of autograft material centrally.  Then said in a similar fashion L2-3 was also cleaned out remove a lot of central disc material packed with cages 9 x 12 extensive autograft mixed centrally then placed pedicle screws at L2 and L3 all screws and implants were confirmed to be in good position with fluoroscopy all the foramina were reinspected and confirmed patency there was a small little dural rent distally on the left L2 nerve  root this was not leaking spinal fluid felt to be distal to where spinal fluid will be found anyway so I just placed a piece of Gelfoam over this  all the foramina were widely decompressed I reexplored him with a coronary dilator to confirm patency and no migration of graft material.  I then decorticated the TPs and facet joints packed and extensive mount of autograft material posterior laterally at L2-3 and L3-4 sized up the rods contoured on anchored everything in place compressed L3 against L4 and L2 against L3.  Then we placed Gelfoam atop the dura injected Exparel  in the fascia copiously irrigated the wound and closed with interrupted Vicryl and a running 4 subcuticular.  Dermabond benzoin Steri-Strips and sterile dressing was applied patient to cover him in stable condition.  At the end the case on needle count sponge counts were correct.

## 2024-03-31 NOTE — H&P (Signed)
 Christina Blake is an 53 y.o. female.   Chief Complaint: Back and left greater than right leg pain HPI: 53 year old female previous history of L4-S1 fusion presents with progressive worsening back and left greater than right leg pain.  Workup revealed segmental degeneration above her fusion at L3-4 and L2-3.  Due to the patient's progression of clinical syndrome imaging findings and failed conservative treatment I recommended decompressive laminectomy interbody fusions at L2-3 and L3-4.  I extensively reviewed the risks and benefits of the operation with her as well as perioperative course expectations of outcome and alternatives to surgery and she understood and agreed to proceed forward.  Past Medical History:  Diagnosis Date   Anxiety    Cervical neuralgia    Chronic back pain    Depression    History of agoraphobia 2007   Hyperlipidemia    Orthostatic hypotension    Pulmonary embolism, bilateral (HCC) 2012   Trochanteric bursitis of both hips     Past Surgical History:  Procedure Laterality Date   ANTERIOR CERVICAL DECOMP/DISCECTOMY FUSION N/A 09/04/2014   Procedure: Cervical four-five Cervical six-seven Anterior cervical decompression/diskectomy,fusion with exploration of Cervical five-six Fusion (Removal of Helix plate);  Surgeon: Fairy Levels, MD;  Location: [facility];  Service: Neurosurgery;  Laterality: N/A;   BACK SURGERY     C5 & C6 fusion  07/25/12   COLONOSCOPY WITH PROPOFOL  N/A 12/15/2020   Procedure: COLONOSCOPY WITH PROPOFOL ;  Surgeon: Golda Claudis PENNER, MD;  Location: [facility];  Service: Endoscopy;  Laterality: N/A;  7:30   HEMOSTASIS CLIP PLACEMENT  12/15/2020   Procedure: HEMOSTASIS CLIP PLACEMENT;  Surgeon: Golda Claudis PENNER, MD;  Location: [facility];  Service: Endoscopy;;   HOT HEMOSTASIS  12/15/2020   Procedure: HOT HEMOSTASIS (ARGON PLASMA COAGULATION/BICAP);  Surgeon: Golda Claudis PENNER, MD;  Location: [facility];  Service: Endoscopy;;   L4,L5 & S1 fusion   07/23/12   OSTEOTOMY     Left foot 2nd toe-[facility]   POLYPECTOMY  12/15/2020   Procedure: POLYPECTOMY;  Surgeon: Golda Claudis PENNER, MD;  Location: [facility];  Service: Endoscopy;;   Uterine ablation      Family History  Problem Relation Age of Onset   Hypertension Mother    Heart disease Mother 59       MI at 23    Osteoporosis Mother    Breast cancer Maternal Aunt    Breast cancer Cousin        Maternal first cousin   Anesthesia problems Neg Hx    Hypotension Neg Hx    Malignant hyperthermia Neg Hx    Pseudochol deficiency Neg Hx    Social History:  reports that she has been smoking cigarettes. She has a 40 pack-year smoking history. She has never used smokeless tobacco. She reports that she does not drink alcohol and does not use drugs.  Allergies: No Known Allergies  Medications Prior to Admission  Medication Sig Dispense Refill   ALPRAZolam  (XANAX ) 0.5 MG tablet Take 0.5 mg by mouth in the morning, at noon, in the evening, and at bedtime.     aspirin EC 81 MG tablet Take 81 mg by mouth daily. Swallow whole.     lamoTRIgine  (LAMICTAL ) 200 MG tablet Take 200 mg by mouth at bedtime.     methocarbamol  (ROBAXIN ) 500 MG tablet Take 500 mg by mouth 4 (four) times daily.     PARoxetine  (PAXIL ) 20 MG tablet Take 20 mg by mouth daily.  pregabalin  (LYRICA ) 75 MG capsule Take 1 capsule (75 mg total) by mouth 2 (two) times daily. 60 capsule 1   progesterone  (PROMETRIUM ) 200 MG capsule Take 2 tablets nightly x 10 monthly 20 capsule 11   temazepam  (RESTORIL ) 15 MG capsule Take 45 mg by mouth at bedtime.     traZODone  (DESYREL ) 150 MG tablet Take 300 mg by mouth at bedtime.     methocarbamol  (ROBAXIN ) 750 MG tablet Take 1 tablet (750 mg total) by mouth every 6 (six) hours as needed for muscle spasms. (Patient not taking: Reported on 03/20/2024) 120 tablet 5    Results for orders placed or performed during the hospital encounter of 03/31/24 (from the past 48 hours)  Pregnancy, urine POC      Status: None   Collection Time: 03/31/24  7:00 AM  Result Value Ref Range   Preg Test, Ur NEGATIVE NEGATIVE    Comment:        THE SENSITIVITY OF THIS METHODOLOGY IS >20 mIU/mL.    No results found.  Review of Systems  Musculoskeletal:  Positive for back pain.  Neurological:  Positive for numbness.    Blood pressure 109/66, pulse 74, temperature 98.4 F (36.9 C), temperature source Oral, resp. rate 17, height 5' 7 (1.702 m), weight 92.1 kg, last menstrual period 03/17/2024, SpO2 95%. Physical Exam HENT:     Head: Normocephalic.     Right Ear: Tympanic membrane normal.     Nose: Nose normal.  Cardiovascular:     Rate and Rhythm: Normal rate.     Pulses: Normal pulses.  Pulmonary:     Effort: Pulmonary effort is normal.  Musculoskeletal:        General: Normal range of motion.     Cervical back: Normal range of motion.  Skin:    General: Skin is warm.  Neurological:     Mental Status: She is alert.     Comments: Patient is awake and alert strength is 5 out of 5 iliopsoas, quads, hamstrings, gastrocs, into tibialis, and EHL.      Assessment/Plan 53 year old presents for decompressive laminectomies interbody fusions L2-3 L3-4  Arley SHAUNNA Helling, MD 03/31/2024, 7:17 AM

## 2024-03-31 NOTE — Anesthesia Procedure Notes (Signed)
 Procedure Name: Intubation Date/Time: 03/31/2024 7:44 AM  Performed by: Lyle Delon LABOR, CRNAPre-anesthesia Checklist: Patient identified, Emergency Drugs available, Suction available and Patient being monitored Patient Re-evaluated:Patient Re-evaluated prior to induction Oxygen Delivery Method: Circle system utilized Preoxygenation: Pre-oxygenation with 100% oxygen Induction Type: IV induction Ventilation: Mask ventilation without difficulty Laryngoscope Size: Glidescope and 3 Grade View: Grade I Tube type: Oral Tube size: 7.0 mm Number of attempts: 1 Airway Equipment and Method: Stylet and Oral airway Placement Confirmation: ETT inserted through vocal cords under direct vision, positive ETCO2 and breath sounds checked- equal and bilateral Secured at: 21 cm Tube secured with: Tape Dental Injury: Teeth and Oropharynx as per pre-operative assessment  Comments: Head/Neck in neutral position for intubation

## 2024-03-31 NOTE — Transfer of Care (Signed)
 Immediate Anesthesia Transfer of Care Note  Patient: Christina Blake  Procedure(s) Performed: POSTERIOR LUMBAR LATERAL AND  INTERBODY FUSION LUMBAR TWO-THREE,LUMBAR THREE-LUMBAR FOUR EXTENTION OF FUSION (Back)  Patient Location: PACU  Anesthesia Type:General  Level of Consciousness: awake, alert , oriented, and patient cooperative  Airway & Oxygen Therapy: Patient Spontanous Breathing and Patient connected to nasal cannula oxygen  Post-op Assessment: Report given to RN and Post -op Vital signs reviewed and stable  Post vital signs: Reviewed and stable  Last Vitals:  Vitals Value Taken Time  BP 101/57 03/31/24 12:32  Temp    Pulse 93 03/31/24 12:34  Resp 22 03/31/24 12:34  SpO2 93 % 03/31/24 12:34  Vitals shown include unfiled device data.  Last Pain:  Vitals:   03/31/24 0708  TempSrc:   PainSc: 7       Patients Stated Pain Goal: 3 (03/31/24 0708)  Complications: No notable events documented.

## 2024-03-31 NOTE — Anesthesia Postprocedure Evaluation (Signed)
 Anesthesia Post Note  Patient: Christina Blake  Procedure(s) Performed: POSTERIOR LUMBAR LATERAL AND  INTERBODY FUSION LUMBAR TWO-THREE,LUMBAR THREE-LUMBAR FOUR EXTENTION OF FUSION (Back)     Patient location during evaluation: PACU Anesthesia Type: General Level of consciousness: awake and alert, oriented and patient cooperative Pain management: pain level controlled Vital Signs Assessment: post-procedure vital signs reviewed and stable Respiratory status: spontaneous breathing, nonlabored ventilation and respiratory function stable Cardiovascular status: blood pressure returned to baseline and stable Postop Assessment: no apparent nausea or vomiting Anesthetic complications: no   No notable events documented.  Last Vitals:  Vitals:   03/31/24 1337 03/31/24 1355  BP: 106/66 94/67  Pulse:  81  Resp:  16  Temp:  37.1 C  SpO2:  95%    Last Pain:  Vitals:   03/31/24 1355  TempSrc: Oral  PainSc:                  Jadia Capers,E. Renesmee Raine

## 2024-04-01 MED ORDER — HYDROCODONE-ACETAMINOPHEN 5-325 MG PO TABS: 2.0000 | ORAL_TABLET | ORAL | 0 refills | Status: AC | PRN

## 2024-04-01 MED FILL — Sodium Chloride IV Soln 0.9%: INTRAVENOUS | Qty: 2000 | Status: AC

## 2024-04-01 MED FILL — Heparin Sodium (Porcine) Inj 1000 Unit/ML: INTRAMUSCULAR | Qty: 30 | Status: AC

## 2024-04-01 NOTE — Progress Notes (Signed)
 OT Cancellation Note  Patient Details Name: Christina Blake MRN: [id number] DOB: Aug 30, 1970   Cancelled Treatment:    Reason Eval/Treat Not Completed: Patient declined, no reason specified ((pt able to state back precautions, has dressed and walked and states needed DME at home. Pt declines all acute PT/OT))  Chaitra Mast K, OTD, OTR/L [facility] [phone]   Christina Blake 04/01/2024, 8:26 AM

## 2024-04-01 NOTE — Progress Notes (Signed)
 PT Cancellation Note  Patient Details Name: Christina Blake MRN: [id number] DOB: September 27, 1970   Cancelled Treatment:    Reason Eval/Treat Not Completed: Patient declined, no reason specified (pt able to state back precautions, has dressed and walked and states needed DME at home. Pt declines all acute PT/OT)   Nelvin Tomb B Nandi Tonnesen 04/01/2024, 7:56 AM Lenoard SQUIBB, PT Acute Rehabilitation Services Office: [phone]

## 2024-04-01 NOTE — Discharge Summary (Signed)
 Physician Discharge Summary  Patient ID: Christina Blake MRN: [id number] DOB/AGE: 05-Apr-1971 53 y.o. Estimated body mass index is 31.79 kg/m as calculated from the following:   Height as of this encounter: 5' 7 (1.702 m).   Weight as of this encounter: 92.1 kg.   Admit date: 03/31/2024 Discharge date: 04/01/2024  Admission Diagnoses: Degenerative disease lumbar spinal stenosis L2-3 L3-4  Discharge Diagnoses: Same Principal Problem:   Spinal stenosis of lumbar region   Discharged Condition: good  Hospital Course: Patient admitted to hospital underwent decompressive laminectomy and interbody fusions at L2-3 and L3-4.  Postoperatively patient did very well to cover the floor on the floor was ambulating voiding spontaneously tolerating her diet stable discharge home except drain output was still putting out a little bit overnight we will watch this morning if the output declining significantly then we will be able to discharge home after lunch.  Consults: Significant Diagnostic Studies: Treatments: Decompressive laminectomies interbody fusions L2-3 L3-4 removal of hardware L4-S1 Discharge Exam: Blood pressure (!) 93/53, pulse 67, temperature 98 F (36.7 C), temperature source Oral, resp. rate 18, height 5' 7 (1.702 m), weight 92.1 kg, last menstrual period 03/17/2024, SpO2 99%. Strength 5 out of 5 and clean dry and intact  Disposition: Home  Discharge Instructions     Ambulatory Referral for Lung Cancer Scre   Complete by: As directed       Allergies as of 04/01/2024   No Known Allergies      Medication List     TAKE these medications    ALPRAZolam  0.5 MG tablet Commonly known as: XANAX  Take 0.5 mg by mouth in the morning, at noon, in the evening, and at bedtime.   aspirin EC 81 MG tablet Take 81 mg by mouth daily. Swallow whole.   HYDROcodone -acetaminophen  5-325 MG tablet Commonly known as: NORCO/VICODIN Take 2 tablets by mouth every 4 (four) hours as needed  for severe pain (pain score 7-10).   lamoTRIgine  200 MG tablet Commonly known as: LAMICTAL  Take 200 mg by mouth at bedtime.   methocarbamol  500 MG tablet Commonly known as: ROBAXIN  Take 500 mg by mouth 4 (four) times daily.   methocarbamol  750 MG tablet Commonly known as: ROBAXIN  Take 1 tablet (750 mg total) by mouth every 6 (six) hours as needed for muscle spasms.   PARoxetine  20 MG tablet Commonly known as: PAXIL  Take 20 mg by mouth daily.   pregabalin  75 MG capsule Commonly known as: Lyrica  Take 1 capsule (75 mg total) by mouth 2 (two) times daily.   progesterone  200 MG capsule Commonly known as: Prometrium  Take 2 tablets nightly x 10 monthly   temazepam  15 MG capsule Commonly known as: RESTORIL  Take 45 mg by mouth at bedtime.   traZODone  150 MG tablet Commonly known as: DESYREL  Take 300 mg by mouth at bedtime.         Signed: Arley SHAUNNA Helling 04/01/2024, 8:12 AM

## 2024-04-01 NOTE — Plan of Care (Signed)
 Pt and family given D/C instructions with verbal understanding. Rx's were sent to the pharmacy by MD. Pt's incision is clean and dry with no sign of infection. Pt's IV was removed prior to D/C. Pt D/C'd home via wheelchair per MD order. Pt is stable @ D/C and has no other needs at this time. Rosina Rakers, RN  ?

## 2024-04-02 ENCOUNTER — Telehealth: Payer: Self-pay | Admitting: Family Medicine

## 2024-04-02 NOTE — Telephone Encounter (Signed)
 Patient had form dropped out to be completed fordisability in your yellow folder.

## 2024-04-09 ENCOUNTER — Telehealth: Payer: Self-pay

## 2024-04-09 ENCOUNTER — Telehealth: Payer: Self-pay | Admitting: Family Medicine

## 2024-04-09 NOTE — Telephone Encounter (Unsigned)
 Copied from CRM [id number]. Topic: General - Other >> Apr 09, 2024 12:56 PM Donee H wrote: Reason for CRM: Christa from [facility] checking to see if form was received via fax. Form for physician statement was faxed on Oct. 1, 2025 to [phone].

## 2024-04-09 NOTE — Telephone Encounter (Signed)
 Patient dropped off document Disability form, to be filled out by provider. Patient requested to send it back via Call Patient to pick up within 7-days. Document is located in providers tray at front office.Please advise at Mobile [phone] (mobile)

## 2024-04-11 NOTE — Telephone Encounter (Signed)
 Cook, Jayce G, DO     04/09/24  8:20 PM I have not taken patient out of work. She has had surgery. This needs to be completed/addressed by her neurosurgeon.

## 2024-05-12 ENCOUNTER — Encounter: Payer: Self-pay | Admitting: Family Medicine

## 2024-05-12 ENCOUNTER — Other Ambulatory Visit: Payer: Self-pay | Admitting: Family Medicine

## 2024-05-12 MED ORDER — PREGABALIN 75 MG PO CAPS: 75.0000 mg | ORAL_CAPSULE | Freq: Two times a day (BID) | ORAL | 1 refills | Status: AC

## 2024-07-11 ENCOUNTER — Other Ambulatory Visit: Payer: Self-pay

## 2024-07-11 DIAGNOSIS — Z122 Encounter for screening for malignant neoplasm of respiratory organs: Secondary | ICD-10-CM

## 2024-07-11 DIAGNOSIS — F17210 Nicotine dependence, cigarettes, uncomplicated: Secondary | ICD-10-CM

## 2024-07-11 DIAGNOSIS — Z87891 Personal history of nicotine dependence: Secondary | ICD-10-CM

## 2024-08-09 ENCOUNTER — Ambulatory Visit (HOSPITAL_COMMUNITY): Admission: RE | Admit: 2024-08-09 | Source: Ambulatory Visit
